# Patient Record
Sex: Female | Born: 1990 | Race: White | Hispanic: No | Marital: Single | Smoking: Former smoker
Health system: Southern US, Community
[De-identification: ages and names within clinical notes are randomized; demographics above are authoritative.]

## PROBLEM LIST (undated history)

## (undated) ENCOUNTER — Inpatient Hospital Stay (HOSPITAL_COMMUNITY): Payer: Self-pay

## (undated) DIAGNOSIS — F32A Depression, unspecified: Secondary | ICD-10-CM

## (undated) DIAGNOSIS — G40909 Epilepsy, unspecified, not intractable, without status epilepticus: Secondary | ICD-10-CM

## (undated) DIAGNOSIS — O139 Gestational [pregnancy-induced] hypertension without significant proteinuria, unspecified trimester: Secondary | ICD-10-CM

## (undated) HISTORY — PX: CHOLECYSTECTOMY: SHX55

## (undated) HISTORY — DX: Gestational (pregnancy-induced) hypertension without significant proteinuria, unspecified trimester: O13.9

## (undated) HISTORY — PX: ECTOPIC PREGNANCY SURGERY: SHX613

---

## 2021-10-27 ENCOUNTER — Encounter (HOSPITAL_COMMUNITY): Payer: Self-pay

## 2021-10-27 ENCOUNTER — Other Ambulatory Visit: Payer: Self-pay

## 2021-10-27 ENCOUNTER — Emergency Department (HOSPITAL_COMMUNITY)
Admission: EM | Admit: 2021-10-27 | Discharge: 2021-10-27 | Disposition: A | Discharge status: Home / Self Care | Payer: Medicaid Other | Attending: Emergency Medicine | Admitting: Emergency Medicine

## 2021-10-27 DIAGNOSIS — J029 Acute pharyngitis, unspecified: Secondary | ICD-10-CM | POA: Insufficient documentation

## 2021-10-27 DIAGNOSIS — Z20822 Contact with and (suspected) exposure to covid-19: Secondary | ICD-10-CM | POA: Diagnosis not present

## 2021-10-27 HISTORY — DX: Epilepsy, unspecified, not intractable, without status epilepticus: G40.909

## 2021-10-27 HISTORY — DX: Depression, unspecified: F32.A

## 2021-10-27 LAB — RESP PANEL BY RT-PCR (FLU A&B, COVID) ARPGX2
Influenza A by PCR: NEGATIVE
Influenza B by PCR: NEGATIVE
SARS Coronavirus 2 by RT PCR: NEGATIVE

## 2021-10-27 MED ORDER — BENZONATATE 100 MG PO CAPS: 100.0000 mg | ORAL_CAPSULE | Freq: Three times a day (TID) | ORAL | 0 refills | Status: DC

## 2021-10-27 NOTE — ED Triage Notes (Signed)
Patient reports cough and sore throat for the past few days.  Reports just moved here and doesn't have  PCP ?

## 2021-10-27 NOTE — ED Provider Notes (Signed)
?[facility] EM[facility] DEPARTMENT ?Provider Note ? ? ?CSN: [id number] ?Arrival date & time: 10/27/21  1136 ? ?  ? ?History ? ?Chief Complaint  ?Patient presents with  ? Sore Throat  ? ? ?Julie Case is a 31 y.o. female. ? ? ?Sore Throat ? ? ?Patient is a 31 year old female with no past pertinent medical history presenting today due to sore throat x2 days.  She is also having a nonproductive cough.  Denies any shortness of breath or chest pain, she has tried Vicks VapoRub without any relief.  No provoking features.  Not vaccinate against COVID and flu.  Does endorse losing sense of taste and smell. ? ?Home Medications ?Prior to Admission medications   ?Not on File  ?   ? ?Allergies    ?Naproxen and Penicillins   ? ?Review of Systems   ?Review of Systems ? ?Physical Exam ?Updated Vital Signs ?BP 125/84 (BP Location: Right Arm)   Pulse 90   Temp 98 ?F (36.7 ?C) (Oral)   Resp 16   Ht 5\' 5"  (1.651 m)   Wt 81.6 kg   LMP 10/07/2021   SpO2 97%   BMI 29.95 kg/m?  ?Physical Exam ?Vitals and nursing note reviewed. Exam conducted with a chaperone present.  ?Constitutional:   ?   Appearance: Normal appearance.  ?HENT:  ?   Head: Normocephalic.  ?   Right Ear: Tympanic membrane normal.  ?   Left Ear: Tympanic membrane normal.  ?   Nose: No congestion.  ?   Mouth/Throat:  ?   Pharynx: No posterior oropharyngeal erythema.  ?Eyes:  ?   Extraocular Movements: Extraocular movements intact.  ?   Pupils: Pupils are equal, round, and reactive to light.  ?Cardiovascular:  ?   Rate and Rhythm: Normal rate and regular rhythm.  ?   Comments: Lungs are clear to auscultation bilaterally ?Pulmonary:  ?   Effort: Pulmonary effort is normal.  ?   Breath sounds: Normal breath sounds.  ?   Comments: Lungs CTA bilaterally. No accessory muscle use. Speaking in complete sentences.  ?Abdominal:  ?   General: Abdomen is flat.  ?   Palpations: Abdomen is soft.  ?Musculoskeletal:  ?   Cervical back: Normal range of motion.  ?Neurological:  ?   Mental  Status: She is alert.  ?Psychiatric:     ?   Mood and Affect: Mood normal.  ? ? ?ED Results / Procedures / Treatments   ?Labs ?(all labs ordered are listed, but only abnormal results are displayed) ?Labs Reviewed  ?RESP PANEL BY RT-PCR (FLU A&B, COVID) [id number]  ? ? ?EKG ?None ? ?Radiology ?No results found. ? ?Procedures ?Procedures  ? ? ?Medications Ordered in ED ?Medications - No data to display ? ?ED Course/ Medical Decision Making/ A&P ?  ?                        ?Medical Decision Making ? ?This is a 31 year old female presenting today with viral symptoms.  Differential diagnosis includes but is not limited to virus, GERD, allergies. ? ?No independent historian, no contributing comorbidities.  Patient is new to the area and she does not have a primary care consistently which a social determinant of health.  Will provide primary care information for follow up. ? ?Patient vitals are stable, she is not febrile, hypoxic or tachycardic.  Lungs are clear to auscultation on exam, her physical exam is unremarkable.  Her symptoms appear most consistent  with a viral URI.  COVID flu swab pending.  I do not think any additional work-up is needed at this time given her stability, history and lack of risk factors.  Will discharge with Tessalon Perles for cough.  Encourage patient to follow-up with the primary, information provided.   ? ? ? ? ? ? ? ?Final Clinical Impression(s) / ED Diagnoses ?Final diagnoses:  ?None  ? ? ?Rx / DC Orders ?ED Discharge Orders   ? ? None  ? ?  ? ? ?  ?Theron Arista, PA-C ?10/27/21 1302 ? ?  ?Bethann Berkshire, MD ?10/28/21 1005 ? ?

## 2021-10-27 NOTE — Discharge Instructions (Addendum)
Information provided for primary care follow-up.  Please check on MyChart for the result of your COVID flu test.  Work note provided.  Return to ED if things change or worsen significantly. ?

## 2021-11-25 ENCOUNTER — Emergency Department (HOSPITAL_COMMUNITY)
Admission: EM | Admit: 2021-11-25 | Discharge: 2021-11-25 | Disposition: A | Discharge status: Home / Self Care | Payer: Medicaid Other | Attending: Emergency Medicine | Admitting: Emergency Medicine

## 2021-11-25 ENCOUNTER — Encounter (HOSPITAL_COMMUNITY): Payer: Self-pay

## 2021-11-25 ENCOUNTER — Other Ambulatory Visit: Payer: Self-pay

## 2021-11-25 ENCOUNTER — Emergency Department (HOSPITAL_COMMUNITY): Payer: Medicaid Other

## 2021-11-25 DIAGNOSIS — W1830XA Fall on same level, unspecified, initial encounter: Secondary | ICD-10-CM | POA: Insufficient documentation

## 2021-11-25 DIAGNOSIS — M25561 Pain in right knee: Secondary | ICD-10-CM | POA: Diagnosis not present

## 2021-11-25 MED ORDER — KETOROLAC TROMETHAMINE 60 MG/2ML IM SOLN
60.0000 mg | Freq: Once | INTRAMUSCULAR | Status: AC
  Administered 2021-11-25: 60 mg via INTRAMUSCULAR
  Filled 2021-11-25: qty 2

## 2021-11-25 NOTE — ED Triage Notes (Signed)
Patient states that her right knee cap started hurting after a fall last week.  Patient states that she felt something pop when she fell.  ?

## 2021-11-25 NOTE — ED Provider Notes (Signed)
?[facility] EMERGENCY DEPARTMENT ?Provider Note ? ? ?CSN: [id number] ?Arrival date & time: 11/25/21  1432 ? ?  ? ?History ? ?Chief Complaint  ?Patient presents with  ? Knee Pain  ? ? ?Julie Bojarski is a 31 y.o. female. ? ? ?Knee Pain ? ?Patient presents with right knee pain x3 days.  Happened acutely after a fall on flat ground.  Denies any her head, no hip pain or ankle pain.  Pain is worse with bearing weight, also worse when walking up and down the stairs.  Denies any paresthesias, has not noticed any swelling or erythema.  No previous surgeries to the knee.  Has tried Tylenol with minimal relief. ? ?Home Medications ?Prior to Admission medications   ?Medication Sig Start Date End Date Taking? Authorizing Provider  ?acetaminophen (TYLENOL) 500 MG tablet Take 500 mg by mouth every 6 (six) hours as needed for mild pain.   Yes [provider]  ?famotidine (PEPCID) 20 MG tablet Take 20 mg by mouth daily as needed for heartburn or indigestion.   Yes [provider]  ?   ? ?Allergies    ?Penicillins and Naproxen   ? ?Review of Systems   ?Review of Systems ? ?Physical Exam ?Updated Vital Signs ?BP 103/79   Pulse 74   Temp (!) 97.5 ?F (36.4 ?C) (Oral)   Resp 18   Ht 5\' 5"  (1.651 m)   Wt 86.2 kg   SpO2 99%   BMI 31.62 kg/m?  ?Physical Exam ?Vitals and nursing note reviewed. Exam conducted with a chaperone present.  ?Constitutional:   ?   General: She is not in acute distress. ?   Appearance: Normal appearance.  ?HENT:  ?   Head: Normocephalic and atraumatic.  ?Eyes:  ?   General: No scleral icterus. ?   Extraocular Movements: Extraocular movements intact.  ?   Pupils: Pupils are equal, round, and reactive to light.  ?Cardiovascular:  ?   Pulses: Normal pulses.  ?Musculoskeletal:     ?   General: Tenderness present. No swelling.  ?   Comments: No tenderness and full ROM to right hip and ankle.  Diffuse tenderness with any manipulation of the patella.  Patient is able to tolerate passive ROM, most  painful with extension of the right knee.  ?Skin: ?   Capillary Refill: Capillary refill takes less than 2 seconds.  ?   Coloration: Skin is not jaundiced.  ?Neurological:  ?   Mental Status: She is alert. Mental status is at baseline.  ?   Coordination: Coordination normal.  ? ? ?ED Results / Procedures / Treatments   ?Labs ?(all labs ordered are listed, but only abnormal results are displayed) ?Labs Reviewed - No data to display ? ?EKG ?None ? ?Radiology ?DG Knee Complete 4 Views Right ? ?Result Date: 11/25/2021 ?CLINICAL DATA:  Status post fall with knee pain. EXAM: RIGHT KNEE - COMPLETE 4+ VIEW COMPARISON:  None. FINDINGS: No evidence of fracture, dislocation, or joint effusion. No evidence of arthropathy or other focal bone abnormality. Soft tissues are unremarkable. IMPRESSION: Negative. Electronically Signed   By: 11/27/2021 M.D.   On: 11/25/2021 15:31   ? ?Procedures ?Procedures  ? ? ?Medications Ordered in ED ?Medications  ?ketorolac (TORADOL) injection 60 mg (60 mg Intramuscular Given 11/25/21 1608)  ? ? ?ED Course/ Medical Decision Making/ A&P ?  ?                        ?  Medical Decision Making ?Amount and/or Complexity of Data Reviewed ?Radiology: ordered. ? ?Risk ?Prescription drug management. ? ? ?Patient presents with right knee pain.  She is neurovascular intact with radial pulse and PT 2+, Brisk cap refill.  Tolerates passive and active range to ankle and hip without difficulty.  Decreased ROM to right knee secondary to pain, diffuse tenderness not focal or associate with any crepitus.  I ordered, viewed interpreted x-ray which did not show any acute bony process.  There is no obvious effusion or swelling over the knee, doubt septic joint.  Patient ordered shot of Toradol and given knee immobilizer, will have her follow-up with orthopedic if needed.  ? ? ? ? ? ? ? ?Final Clinical Impression(s) / ED Diagnoses ?Final diagnoses:  ?Acute pain of right knee  ? ? ?Rx / DC Orders ?ED Discharge Orders    ? ? None  ? ?  ? ? ?  ?Theron Arista, PA-C ?11/25/21 1738 ? ?  ?Glendora Score, MD ?11/26/21 0005 ? ?

## 2021-11-25 NOTE — Discharge Instructions (Addendum)
Take 800 mg of ibuprofen every 8 hours for inflammation.  Take with food and water to prevent stomach irritation.  Ice the knee, wear the knee immobilizer with ambulation.  Call the orthopedic office Monday to schedule outpatient follow-up. ?

## 2022-01-06 ENCOUNTER — Encounter (HOSPITAL_COMMUNITY): Payer: Self-pay | Admitting: Emergency Medicine

## 2022-01-06 ENCOUNTER — Emergency Department (HOSPITAL_COMMUNITY)
Admission: EM | Admit: 2022-01-06 | Discharge: 2022-01-06 | Disposition: A | Discharge status: Home / Self Care | Payer: Medicaid Other | Attending: Emergency Medicine | Admitting: Emergency Medicine

## 2022-01-06 ENCOUNTER — Other Ambulatory Visit: Payer: Self-pay

## 2022-01-06 ENCOUNTER — Emergency Department (HOSPITAL_COMMUNITY): Payer: Medicaid Other

## 2022-01-06 DIAGNOSIS — O26891 Other specified pregnancy related conditions, first trimester: Secondary | ICD-10-CM | POA: Insufficient documentation

## 2022-01-06 DIAGNOSIS — R197 Diarrhea, unspecified: Secondary | ICD-10-CM | POA: Diagnosis not present

## 2022-01-06 DIAGNOSIS — Z3A01 Less than 8 weeks gestation of pregnancy: Secondary | ICD-10-CM | POA: Diagnosis not present

## 2022-01-06 DIAGNOSIS — R110 Nausea: Secondary | ICD-10-CM | POA: Insufficient documentation

## 2022-01-06 DIAGNOSIS — Z3201 Encounter for pregnancy test, result positive: Secondary | ICD-10-CM

## 2022-01-06 DIAGNOSIS — O219 Vomiting of pregnancy, unspecified: Secondary | ICD-10-CM | POA: Diagnosis not present

## 2022-01-06 DIAGNOSIS — F17290 Nicotine dependence, other tobacco product, uncomplicated: Secondary | ICD-10-CM | POA: Insufficient documentation

## 2022-01-06 LAB — COMPREHENSIVE METABOLIC PANEL
ALT: 29 U/L (ref 0–44)
AST: 18 U/L (ref 15–41)
Albumin: 4 g/dL (ref 3.5–5.0)
Alkaline Phosphatase: 81 U/L (ref 38–126)
Anion gap: 5 (ref 5–15)
BUN: 10 mg/dL (ref 6–20)
CO2: 26 mmol/L (ref 22–32)
Calcium: 9.3 mg/dL (ref 8.9–10.3)
Chloride: 106 mmol/L (ref 98–111)
Creatinine, Ser: 0.57 mg/dL (ref 0.44–1.00)
GFR, Estimated: >60 mL/min (ref >60)
Glucose, Bld: 95 mg/dL (ref 70–99)
Potassium: 3.8 mmol/L (ref 3.5–5.1)
Sodium: 137 mmol/L (ref 135–145)
Total Bilirubin: 0.6 mg/dL (ref 0.3–1.2)
Total Protein: 7.3 g/dL (ref 6.5–8.1)

## 2022-01-06 LAB — CBC WITH DIFFERENTIAL/PLATELET
Abs Immature Granulocytes: 0.03 10*3/uL (ref 0.00–0.07)
Basophils Absolute: 0 10*3/uL (ref 0.0–0.1)
Basophils Relative: 1 %
Eosinophils Absolute: 0.4 10*3/uL (ref 0.0–0.5)
Eosinophils Relative: 6 %
HCT: 42.4 % (ref 36.0–46.0)
Hemoglobin: 14.6 g/dL (ref 12.0–15.0)
Immature Granulocytes: 0 %
Lymphocytes Relative: 24 %
Lymphs Abs: 1.7 10*3/uL (ref 0.7–4.0)
MCH: 29.6 pg (ref 26.0–34.0)
MCHC: 34.4 g/dL (ref 30.0–36.0)
MCV: 85.8 fL (ref 80.0–100.0)
Monocytes Absolute: 0.6 10*3/uL (ref 0.1–1.0)
Monocytes Relative: 8 %
Neutro Abs: 4.2 10*3/uL (ref 1.7–7.7)
Neutrophils Relative %: 61 %
Platelets: 239 10*3/uL (ref 150–400)
RBC: 4.94 MIL/uL (ref 3.87–5.11)
RDW: 12.3 % (ref 11.5–15.5)
WBC: 6.9 10*3/uL (ref 4.0–10.5)
nRBC: 0 % (ref 0.0–0.2)

## 2022-01-06 LAB — URINALYSIS, ROUTINE W REFLEX MICROSCOPIC
Bilirubin Urine: NEGATIVE
Glucose, UA: NEGATIVE mg/dL
Hgb urine dipstick: NEGATIVE
Ketones, ur: NEGATIVE mg/dL
Leukocytes,Ua: NEGATIVE
Nitrite: NEGATIVE
Protein, ur: NEGATIVE mg/dL
Specific Gravity, Urine: 1.002 — ABNORMAL LOW (ref 1.005–1.030)
pH: 7 (ref 5.0–8.0)

## 2022-01-06 LAB — LIPASE, BLOOD: Lipase: 25 U/L (ref 11–51)

## 2022-01-06 LAB — HCG, QUANTITATIVE, PREGNANCY: hCG, Beta Chain, Quant, S: 2120 m[IU]/mL — ABNORMAL HIGH (ref <5)

## 2022-01-06 LAB — POC URINE PREG, ED: Preg Test, Ur: POSITIVE — AB

## 2022-01-06 MED ORDER — ONDANSETRON HCL 4 MG/2ML IJ SOLN
4.0000 mg | Freq: Once | INTRAMUSCULAR | Status: AC
  Administered 2022-01-06: 4 mg via INTRAVENOUS
  Filled 2022-01-06: qty 2

## 2022-01-06 MED ORDER — ONDANSETRON 4 MG PO TBDP: 4.0000 mg | ORAL_TABLET | Freq: Three times a day (TID) | ORAL | 0 refills | Status: DC | PRN

## 2022-01-06 MED ORDER — SODIUM CHLORIDE 0.9 % IV BOLUS
1000.0000 mL | Freq: Once | INTRAVENOUS | Status: AC
  Administered 2022-01-06: 1000 mL via INTRAVENOUS

## 2022-01-06 NOTE — Discharge Instructions (Addendum)
You have been provided the contact information for a local OB/GYN office by the name of [facility].  Follow-up with OB/GYN within the next 2 to 3 days to establish care and for continued medical management.  A prescription by the name of Zofran has been sent to your pharmacy.  These are disintegrating tablets you place under your tongue that may provide nausea relief.  You may take 1 every 8 hours as needed for symptom relief.  Further information for first trimester pregnancy has been provided for you.  Please review at your leisure.  Return to the ED for new or worsening symptoms as discussed.

## 2022-01-06 NOTE — ED Provider Notes (Signed)
[facility] EMERGENCY DEPARTMENT Provider Note   CSN: [id number] Arrival date & time: 01/06/22  1346     History  Chief Complaint  Patient presents with   Nausea    Julie Case is a 31 y.o. female with chief complaint of nausea and diarrhea.  Nausea started 4 days ago, with 1 day of diarrhea.  Denies vomiting, abdominal pain, fevers, urinary symptoms, constipation, chest pain, shortness of breath.  Has not been anybody with similar symptoms.  Denies vaginal bleeding or discharge, or bloody stools.  No recent upper respiratory infection or antibiotic use.  LMP 5 weeks ago, patient states she is 1 week late and that this is irregular for her as she is normally very consistent with her schedule.  Hx of ectopic pregnancy last year, that required surgical intervention at [redacted] weeks gestation.  Patient unsure of how many total pregnancies, but does have 3 living children.  The history is provided by the patient and medical records.       Home Medications Prior to Admission medications   Medication Sig Start Date End Date Taking? Authorizing Provider  ondansetron (ZOFRAN-ODT) 4 MG disintegrating tablet Take 1 tablet (4 mg total) by mouth every 8 (eight) hours as needed for nausea or vomiting. 01/06/22  Yes Cecil Cobbs, PA-C  acetaminophen (TYLENOL) 500 MG tablet Take 500 mg by mouth every 6 (six) hours as needed for mild pain.    [provider]  famotidine (PEPCID) 20 MG tablet Take 20 mg by mouth daily as needed for heartburn or indigestion.    [provider]      Allergies    Penicillins and Naproxen    Review of Systems   Review of Systems  Gastrointestinal:  Positive for diarrhea and nausea.    Physical Exam Updated Vital Signs BP 101/64   Pulse 66   Temp 97.9 F (36.6 C) (Oral)   Resp 17   Ht  (1.651 m)   Wt 88.5 kg   LMP 11/30/2021   SpO2 100%   BMI 32.45 kg/m  Physical Exam Vitals and nursing note reviewed.  Constitutional:       General: She is not in acute distress.    Appearance: She is well-developed.  HENT:     Head: Normocephalic and atraumatic.  Eyes:     Conjunctiva/sclera: Conjunctivae normal.  Cardiovascular:     Rate and Rhythm: Normal rate and regular rhythm.     Pulses: Normal pulses.     Heart sounds: Normal heart sounds. No murmur heard. Pulmonary:     Effort: Pulmonary effort is normal. No respiratory distress.     Breath sounds: Normal breath sounds.  Chest:     Chest wall: No tenderness.  Abdominal:     General: Bowel sounds are normal. There is no distension.     Palpations: Abdomen is soft. There is no mass.     Tenderness: There is no abdominal tenderness. There is no rebound.  Musculoskeletal:        General: No swelling.     Cervical back: Neck supple.  Skin:    General: Skin is warm and dry.     Capillary Refill: Capillary refill takes less than 2 seconds.  Neurological:     Mental Status: She is alert and oriented to person, place, and time.  Psychiatric:        Mood and Affect: Mood normal.     ED Results / Procedures / Treatments   Labs (all  labs ordered are listed, but only abnormal results are displayed) Labs Reviewed  URINALYSIS, ROUTINE W REFLEX MICROSCOPIC - Abnormal; Notable for the following components:      Result Value   Color, Urine STRAW (*)    APPearance HAZY (*)    Specific Gravity, Urine 1.002 (*)    All other components within normal limits  HCG, QUANTITATIVE, PREGNANCY - Abnormal; Notable for the following components:   hCG, Beta Chain, Quant, S 2,120 (*)    All other components within normal limits  POC URINE PREG, ED - Abnormal; Notable for the following components:   Preg Test, Ur POSITIVE (*)    All other components within normal limits  CBC WITH DIFFERENTIAL/PLATELET  COMPREHENSIVE METABOLIC PANEL  LIPASE, BLOOD    EKG None  Radiology US OB LESS THAN 14 WEEKS WITH OB TRANSVAGINAL  Result Date: 01/06/2022 CLINICAL DATA:  Nausea, positive  urine pregnancy test, history of right tubal ectopic pregnancy status post salpingectomy EXAM: OBSTETRIC <14 WK ULTRASOUND TECHNIQUE: Transabdominal ultrasound was performed for evaluation of the gestation as well as the maternal uterus and adnexal regions. COMPARISON:  None Available. FINDINGS: Intrauterine gestational sac: Single Yolk sac:  Visualized. Embryo:  Not Visualized. Cardiac Activity: Not Visualized. Heart Rate: Not visualized. MSD:  17.5 mm   6 w   5 d Subchorionic hemorrhage:  None visualized. Maternal uterus/adnexae: Trace pleural fluid in the low pelvis. IMPRESSION: 1. Single candidate intrauterine gestational sac in the endometrial cavity. Yolk sac is visualized without fetal pole at this time. Early pregnancy of uncertain viability. Recommend follow-up quantitative B-HCG levels and follow-up US in 7-14 days to assess continued development and viability. 2.  Trace nonspecific free fluid in the low pelvis. Electronically Signed   By: Jearld LeschAlex D Bibbey M.D.   On: 01/06/2022 17:23    Procedures Procedures    Medications Ordered in ED Medications  sodium chloride 0.9 % bolus 1,000 mL (0 mLs Intravenous Stopped 01/06/22 1636)  ondansetron (ZOFRAN) injection 4 mg (4 mg Intravenous Given 01/06/22 1503)    ED Course/ Medical Decision Making/ A&P Clinical Course as of 01/06/22 1838  Sat Jan 06, 2022  1833 HCG, Clement SayresBeta Chain, Quant, Kathie RhodesS(!): 2,120 [AC]    Clinical Course User Index [AC] Cecil Cobbsockerham, Kohan Azizi M, PA-C                           Medical Decision Making Amount and/or Complexity of Data Reviewed External Data Reviewed: notes. Labs: ordered. Decision-making details documented in ED Course. Radiology: ordered and independent interpretation performed. Decision-making details documented in ED Course. ECG/medicine tests: ordered and independent interpretation performed. Decision-making details documented in ED Course.  Risk OTC drugs. Prescription drug management.   31 y.o. female  presents to the ED for concern of Nausea   This involves an extensive number of treatment options, and is a complaint that carries with it a high risk of complications and morbidity.  The emergent differential diagnosis prior to evaluation includes, but is not limited to: appendicitis, bowel obstruction, bowel perforation, cholecystitis, diverticulitis, renal calculi, pancreatitis, gastroenteritis, UTI, PID, or ectopic pregnancy.  This is not an exhaustive differential.   Past Medical History / Co-morbidities / Social History: Hx of epilepsy, depression, prior cholecystectomy, prior ectopic pregnancy with surgical intervention, chronic tobacco use Social Determinants of Health include daily tobacco use, for cessation counseling was provided.  Additional History:  Internal and external records from outside source obtained and reviewed including ED visits  Physical Exam: Physical exam performed. The pertinent findings include: Abdomen soft nontender.  Lab Tests: I ordered, and personally interpreted labs.  The pertinent results include:   CBC: Unremarkable CMP/BMP: Unremarkable Lipase: 25, negative UA: Unremarkable Urine pregnancy: Positive hCG quantitative: 2,120  Imaging Studies: I ordered imaging studies including pelvic/transvaginal ultrasound.  I independently visualized and interpreted said imaging.  Pertinent results include: 1. Single candidate intrauterine gestational sac in the endometrial cavity. Yolk sac is visualized without fetal pole at this time. Early pregnancy of uncertain viability. Recommend follow-up quantitative B-HCG levels and follow-up US in 7-14 days to assess continued development and viability 2.  Trace nonspecific free fluid in the low pelvis. I agree with the radiologist interpretation.  Medications: I have reviewed the patients home medicines and have made adjustments as needed  ED Course/Disposition: Pt well-appearing on exam.  Patient presenting today  with nausea and diarrhea over the last 4 days.  Abdomen soft nontender.  Afebrile, without vomiting, in NAD.  No vaginal bleeding or discharge.  Positive urine pregnancy.  Hx of ectopic pregnancy about 6 months ago, for which a laparoscopy was performed and the ectopic pregnancy removed, leaving the ovary intact.  Unknown gestation.  hCG quantitative pending.  With this history, patient is at increased risk for another ectopic pregnancy.  Not concerned for active rupture at this time.  Hx of cholecystectomy.  No clinical indication of appendicitis, bowel obstruction, bowel perforation, cholecystitis, diverticulitis, renal calculi, pancreatitis, UTI, or PID.  IVF provided for possible fluid loss from diarrhea and Zofran for nausea relief.  Reevaluation shows that this is provided moderate improvement of the patient's symptoms.  Patient is new to the area from [place], and has not established with PCP or OB/GYN.  Ordered pelvic/transvaginal ultrasound to assess for possible ectopic pregnancy.  1630: [place] order still pending.    1500: hCG quantitative at 2,120.  This seems to correlate with hx of LMP.  US performed, results pending.  1730: A single intrauterine gestational sac appreciated on [place], without evidence of current ectopic pregnancy.  Unknown viability at this time.  Not concerned for ectopic pregnancy or threatened pregnancy at this time.  Overall remaining work-up unremarkable.  Recommend conservative nausea management and establishing with local OB/GYN for close follow-up.  Recommend tracking BhCG and repeat US.  Resources provided.  Patient in NAD and in good condition at time of discharge.  After consideration of the diagnostic results and the patient's encounter today, I feel that the emergency department workup does not suggest an emergent condition requiring admission or immediate intervention beyond what has been performed at this time.  The patient is safe for discharge and has been instructed  to return immediately for worsening symptoms, change in symptoms or any other concerns.  Discussed course of treatment thoroughly with the patient, whom demonstrated understanding.  Patient in agreement and has no further questions.  I discussed this case with my attending physician Dr. Rubin Payor, who agreed with the proposed treatment course and cosigned this note including patient's presenting symptoms, physical exam, and planned diagnostics and interventions.  Attending physician stated agreement with plan or made changes to plan which were implemented.     This chart was dictated using voice recognition software.  Despite best efforts to proofread, errors can occur which can change the documentation meaning.         Final Clinical Impression(s) / ED Diagnoses Final diagnoses:  Nausea  Diarrhea, unspecified type  Positive pregnancy test    Rx /  DC Orders ED Discharge Orders          Ordered    ondansetron (ZOFRAN-ODT) 4 MG disintegrating tablet  Every 8 hours PRN        01/06/22 1514              Cecil Cobbs, Cordelia Poche 01/06/22 1842    Benjiman Core, MD 01/07/22 0000

## 2022-01-06 NOTE — ED Triage Notes (Signed)
Patient c/o nausea and diarrhea x4 days. Denies any pain, vomiting, fevers, or urinary symptoms. Denies being around anyone with similar symptoms. Patient unsure if she is pregnant.

## 2022-01-09 ENCOUNTER — Ambulatory Visit (INDEPENDENT_AMBULATORY_CARE_PROVIDER_SITE_OTHER): Payer: Medicaid Other | Admitting: Family Medicine

## 2022-01-09 ENCOUNTER — Encounter: Payer: Self-pay | Admitting: Family Medicine

## 2022-01-09 VITALS — BP 120/80 | HR 103 | Ht 65.0 in | Wt 205.6 lb

## 2022-01-09 DIAGNOSIS — R197 Diarrhea, unspecified: Secondary | ICD-10-CM | POA: Diagnosis not present

## 2022-01-09 DIAGNOSIS — Z3A01 Less than 8 weeks gestation of pregnancy: Secondary | ICD-10-CM | POA: Diagnosis not present

## 2022-01-09 NOTE — Progress Notes (Unsigned)
New Patient Office Visit  Subjective:  Patient ID: Julie Case, female    DOB: September 26, 1990  Age: 31 y.o. MRN: [id number]  CC:  Chief Complaint  Patient presents with   New Patient (Initial Visit)    Pt establishing care, pt would like to discuss ED visit, complains of having diarrhea for the past 4 days.     HPI Julie Case is a 31 y.o. Female who presents today with c/o of diarrhea for 4 days. She reports having 3-4 loose stools daily. No blood or mucus was noted. No changes in diet. No fever, chills, or abdominal pain. She recently had a positive pregnancy test in the ED.  Past Medical History:  Diagnosis Date   Depression    Epilepsy (HCC)     Past Surgical History:  Procedure Laterality Date   CHOLECYSTECTOMY     ECTOPIC PREGNANCY SURGERY     with right tube removal    Family History  Problem Relation Age of Onset   Hypertension Other    Diabetes Other     Social History   Socioeconomic History   Marital status: Single    Spouse name: Not on file   Number of children: Not on file   Years of education: Not on file   Highest education level: Not on file  Occupational History   Not on file  Tobacco Use   Smoking status: Every Day    Types: Cigarettes   Smokeless tobacco: Never  Vaping Use   Vaping Use: Never used  Substance and Sexual Activity   Alcohol use: Never   Drug use: Never   Sexual activity: Yes  Other Topics Concern   Not on file  Social History Narrative   Not on file   Social Determinants of Health   Financial Resource Strain: Not on file  Food Insecurity: Not on file  Transportation Needs: Not on file  Physical Activity: Not on file  Stress: Not on file  Social Connections: Not on file  Intimate Partner Violence: Not on file    ROS Review of Systems  Constitutional:  Negative for diaphoresis and unexpected weight change.  HENT:  Negative for tinnitus, trouble swallowing and voice change.   Eyes:  Negative for photophobia and  redness.  Respiratory:  Negative for wheezing and stridor.   Cardiovascular:  Negative for leg swelling.  Gastrointestinal:  Positive for diarrhea. Negative for constipation and nausea.  Endocrine: Negative for polydipsia, polyphagia and polyuria.  Genitourinary:  Negative for frequency, urgency, vaginal bleeding, vaginal discharge and vaginal pain.  Musculoskeletal:  Negative for back pain and myalgias.  Skin:  Negative for rash and wound.  Neurological:  Negative for dizziness and headaches.  Psychiatric/Behavioral:  Negative for confusion, self-injury and suicidal ideas.     Objective:   Today's Vitals: BP 120/80   Pulse (!) 103   Ht 5\' 5"  (1.651 m)   Wt 205 lb 9.6 oz (93.3 kg)   LMP  (LMP Unknown)   SpO2 98%   BMI 34.21 kg/m   Physical Exam HENT:     Head: Normocephalic.     Right Ear: External ear normal.     Left Ear: External ear normal.     Nose: No congestion or rhinorrhea.     Mouth/Throat:     Mouth: Mucous membranes are moist.  Eyes:     Extraocular Movements: Extraocular movements intact.     Pupils: Pupils are equal, round, and reactive to light.  Cardiovascular:  Rate and Rhythm: Normal rate and regular rhythm.     Pulses: Normal pulses.     Heart sounds: Normal heart sounds.  Pulmonary:     Effort: Pulmonary effort is normal.     Breath sounds: Normal breath sounds.  Abdominal:     Palpations: Abdomen is soft.  Musculoskeletal:     Cervical back: No rigidity.     Right lower leg: No edema.     Left lower leg: No edema.  Lymphadenopathy:     Cervical: No cervical adenopathy.  Skin:    Findings: No lesion or rash.  Neurological:     Mental Status: She is alert and oriented to person, place, and time.  Psychiatric:     Comments: Normal affect     Assessment & Plan:   Problem List Items Addressed This Visit       Other   Diarrhea - Primary    -advise to avoid solid food or dairy or to restrict diet to bananas, rice, applesauce, and toast  (BRAT diet) -advise to increase your intake of water, juice, broth based soups, or sports drinks.BRAT      Pregnancy    -positive pregnancy test on 01/06/22 -referral made to gyn      Relevant Orders   Ambulatory referral to Gynecology   Other Visit Diagnoses     Acute diarrhea           Outpatient Encounter Medications as of 01/09/2022  Medication Sig   acetaminophen (TYLENOL) 500 MG tablet Take 500 mg by mouth every 6 (six) hours as needed for mild pain. (Patient not taking: Reported on 01/09/2022)   famotidine (PEPCID) 20 MG tablet Take 20 mg by mouth daily as needed for heartburn or indigestion. (Patient not taking: Reported on 01/09/2022)   ondansetron (ZOFRAN-ODT) 4 MG disintegrating tablet Take 1 tablet (4 mg total) by mouth every 8 (eight) hours as needed for nausea or vomiting. (Patient not taking: Reported on 01/09/2022)   No facility-administered encounter medications on file as of 01/09/2022.    Follow-up: Return in about 3 months (around 04/11/2022).   Gilmore Laroche, FNP

## 2022-01-09 NOTE — Patient Instructions (Addendum)
I appreciate the opportunity to provide care to you today!    Follow up:  3 months  Labs: next visit  Diarrhea -avoid solid food or dairy or to restrict diet to bananas, rice, applesauce, and toast (BRAT diet) -Increase your intake of water, juice, broth based soups, or sports drinks.BRAT    Please continue to a heart-healthy diet and increase your physical activities. Try to exercise for at least three times a week.      It was a pleasure to see you and I look forward to continuing to work together on your health and well-being. Please do not hesitate to call the office if you need care or have questions about your care.   Have a wonderful day and week. With Gratitude, Gilmore Laroche MSN, FNP-BC

## 2022-01-09 NOTE — Progress Notes (Unsigned)
   New Patient Office Visit  Subjective:  Patient ID: Julie Case, female    DOB: [birth year]  Age: 31 y.o. MRN: [id number]  CC:  Chief Complaint  Patient presents with   New Patient (Initial Visit)    Pt establishing care, pt would like to discuss ED visit, complains of having diarrhea for the past 4 days.     HPI Julie Case is a 31 y.o. female with past medical history of *** presents for establishing care. Diarrhea: 4-5 loose stools in [employer] 3-4 days -nothning - eating soup and not firedn  No mussica or blood.   Past Medical History:  Diagnosis Date   Depression    Epilepsy (HCC)     Past Surgical History:  Procedure Laterality Date   CHOLECYSTECTOMY     ECTOPIC PREGNANCY SURGERY     with right tube removal    Family History  Problem Relation Age of Onset   Hypertension Other    Diabetes Other     Social History   Socioeconomic History   Marital status: Single    Spouse name: Not on file   Number of children: Not on file   Years of education: Not on file   Highest education level: Not on file  Occupational History   Not on file  Tobacco Use   Smoking status: Every Day    Types: Cigarettes   Smokeless tobacco: Never  Vaping Use   Vaping Use: Never used  Substance and Sexual Activity   Alcohol use: Never   Drug use: Never   Sexual activity: Yes  Other Topics Concern   Not on file  Social History Narrative   Not on file   Social Determinants of Health   Financial Resource Strain: Not on file  Food Insecurity: Not on file  Transportation Needs: Not on file  Physical Activity: Not on file  Stress: Not on file  Social Connections: Not on file  Intimate Partner Violence: Not on file    ROS Review of Systems  Gastrointestinal:  Positive for diarrhea.    Objective:   Today's Vitals: BP 120/80   Pulse (!) 103   Ht 5\' 5"  (1.651 m)   Wt 205 lb 9.6 oz (93.3 kg)   LMP 11/30/2021   SpO2 98%   BMI 34.21 kg/m   Physical Exam  Assessment &  Plan:   Problem List Items Addressed This Visit   None   Outpatient Encounter Medications as of 01/09/2022  Medication Sig   acetaminophen (TYLENOL) 500 MG tablet Take 500 mg by mouth every 6 (six) hours as needed for mild pain. (Patient not taking: Reported on 01/09/2022)   famotidine (PEPCID) 20 MG tablet Take 20 mg by mouth daily as needed for heartburn or indigestion. (Patient not taking: Reported on 01/09/2022)   ondansetron (ZOFRAN-ODT) 4 MG disintegrating tablet Take 1 tablet (4 mg total) by mouth every 8 (eight) hours as needed for nausea or vomiting. (Patient not taking: Reported on 01/09/2022)   No facility-administered encounter medications on file as of 01/09/2022.    Follow-up: No follow-ups on file.   01/11/2022, FNP

## 2022-01-10 ENCOUNTER — Encounter: Payer: Self-pay | Admitting: Family Medicine

## 2022-01-10 DIAGNOSIS — Z3490 Encounter for supervision of normal pregnancy, unspecified, unspecified trimester: Secondary | ICD-10-CM | POA: Insufficient documentation

## 2022-01-10 DIAGNOSIS — R197 Diarrhea, unspecified: Secondary | ICD-10-CM | POA: Insufficient documentation

## 2022-01-10 NOTE — Assessment & Plan Note (Signed)
-  advise to avoid solid food or dairy or to restrict diet to bananas, rice, applesauce, and toast (BRAT diet) -advise to increase your intake of water, juice, broth based soups, or sports drinks.BRAT

## 2022-01-10 NOTE — Assessment & Plan Note (Signed)
-  positive pregnancy test on 01/06/22 -referral made to gyn

## 2022-01-19 ENCOUNTER — Encounter (HOSPITAL_COMMUNITY): Payer: Self-pay

## 2022-01-19 ENCOUNTER — Emergency Department (HOSPITAL_COMMUNITY)
Admission: EM | Admit: 2022-01-19 | Discharge: 2022-01-19 | Disposition: A | Discharge status: Home / Self Care | Payer: Medicaid Other | Attending: Emergency Medicine | Admitting: Emergency Medicine

## 2022-01-19 ENCOUNTER — Emergency Department (HOSPITAL_COMMUNITY): Payer: Medicaid Other

## 2022-01-19 ENCOUNTER — Other Ambulatory Visit: Payer: Self-pay

## 2022-01-19 DIAGNOSIS — O26891 Other specified pregnancy related conditions, first trimester: Secondary | ICD-10-CM | POA: Diagnosis not present

## 2022-01-19 DIAGNOSIS — Z3A01 Less than 8 weeks gestation of pregnancy: Secondary | ICD-10-CM | POA: Insufficient documentation

## 2022-01-19 DIAGNOSIS — O4690 Antepartum hemorrhage, unspecified, unspecified trimester: Secondary | ICD-10-CM

## 2022-01-19 DIAGNOSIS — O209 Hemorrhage in early pregnancy, unspecified: Secondary | ICD-10-CM | POA: Diagnosis not present

## 2022-01-19 DIAGNOSIS — N939 Abnormal uterine and vaginal bleeding, unspecified: Secondary | ICD-10-CM | POA: Diagnosis not present

## 2022-01-19 LAB — URINALYSIS, ROUTINE W REFLEX MICROSCOPIC
Bilirubin Urine: NEGATIVE
Glucose, UA: NEGATIVE mg/dL
Ketones, ur: NEGATIVE mg/dL
Leukocytes,Ua: NEGATIVE
Nitrite: NEGATIVE
Protein, ur: NEGATIVE mg/dL
Specific Gravity, Urine: 1.011 (ref 1.005–1.030)
pH: 5 (ref 5.0–8.0)

## 2022-01-19 LAB — COMPREHENSIVE METABOLIC PANEL WITH GFR
ALT: 17 U/L (ref 0–44)
AST: 13 U/L — ABNORMAL LOW (ref 15–41)
Albumin: 4.1 g/dL (ref 3.5–5.0)
Alkaline Phosphatase: 80 U/L (ref 38–126)
Anion gap: 6 (ref 5–15)
BUN: 8 mg/dL (ref 6–20)
CO2: 27 mmol/L (ref 22–32)
Calcium: 9.4 mg/dL (ref 8.9–10.3)
Chloride: 106 mmol/L (ref 98–111)
Creatinine, Ser: 0.52 mg/dL (ref 0.44–1.00)
GFR, Estimated: >60 mL/min
Glucose, Bld: 95 mg/dL (ref 70–99)
Potassium: 3.9 mmol/L (ref 3.5–5.1)
Sodium: 139 mmol/L (ref 135–145)
Total Bilirubin: 0.5 mg/dL (ref 0.3–1.2)
Total Protein: 7.3 g/dL (ref 6.5–8.1)

## 2022-01-19 LAB — CBC WITH DIFFERENTIAL/PLATELET
Abs Immature Granulocytes: 0.04 10*3/uL (ref 0.00–0.07)
Basophils Absolute: 0.1 10*3/uL (ref 0.0–0.1)
Basophils Relative: 1 %
Eosinophils Absolute: 0.4 10*3/uL (ref 0.0–0.5)
Eosinophils Relative: 5 %
HCT: 41.5 % (ref 36.0–46.0)
Hemoglobin: 14.1 g/dL (ref 12.0–15.0)
Immature Granulocytes: 1 %
Lymphocytes Relative: 21 %
Lymphs Abs: 1.8 10*3/uL (ref 0.7–4.0)
MCH: 29.1 pg (ref 26.0–34.0)
MCHC: 34 g/dL (ref 30.0–36.0)
MCV: 85.7 fL (ref 80.0–100.0)
Monocytes Absolute: 0.7 10*3/uL (ref 0.1–1.0)
Monocytes Relative: 8 %
Neutro Abs: 5.6 10*3/uL (ref 1.7–7.7)
Neutrophils Relative %: 64 %
Platelets: 226 10*3/uL (ref 150–400)
RBC: 4.84 MIL/uL (ref 3.87–5.11)
RDW: 12.4 % (ref 11.5–15.5)
WBC: 8.5 10*3/uL (ref 4.0–10.5)
nRBC: 0 % (ref 0.0–0.2)

## 2022-01-19 LAB — TYPE AND SCREEN
ABO/RH(D): O POS
Antibody Screen: NEGATIVE

## 2022-01-19 LAB — ABO/RH: ABO/RH(D): O POS

## 2022-01-19 LAB — WET PREP, GENITAL
Clue Cells Wet Prep HPF POC: NONE SEEN
Sperm: NONE SEEN
Trich, Wet Prep: NONE SEEN
WBC, Wet Prep HPF POC: <10
Yeast Wet Prep HPF POC: NONE SEEN

## 2022-01-19 NOTE — ED Triage Notes (Addendum)
Pt presents to ED with complaints of vaginal bleeding. Pt is currently [redacted] weeks pregnant. Pt also having right lower abdominal pain. Pt had tubal pregnancy and had right tube removed 2022. Pt states bleeding is bright red when she wipes. Pt states she has already had an US done this pregnancy confirming this pregnancy is not a tubal pregnancy, she was suppose to go to [facility] 6/17 for [place] but missed her appointment.

## 2022-01-19 NOTE — ED Notes (Signed)
Pt has called out requesting something for pain. MD made aware. No new orders at this time.

## 2022-01-21 ENCOUNTER — Encounter (HOSPITAL_COMMUNITY): Payer: Self-pay

## 2022-01-21 ENCOUNTER — Inpatient Hospital Stay (HOSPITAL_COMMUNITY): Payer: Medicaid Other

## 2022-01-21 ENCOUNTER — Inpatient Hospital Stay (HOSPITAL_COMMUNITY)
Admission: AD | Admit: 2022-01-21 | Discharge: 2022-01-21 | Disposition: A | Discharge status: Home / Self Care | Payer: Medicaid Other | Attending: Obstetrics & Gynecology | Admitting: Obstetrics & Gynecology

## 2022-01-21 ENCOUNTER — Other Ambulatory Visit: Payer: Self-pay

## 2022-01-21 DIAGNOSIS — O039 Complete or unspecified spontaneous abortion without complication: Secondary | ICD-10-CM | POA: Diagnosis not present

## 2022-01-21 DIAGNOSIS — Z3A01 Less than 8 weeks gestation of pregnancy: Secondary | ICD-10-CM | POA: Diagnosis not present

## 2022-01-21 NOTE — MAU Note (Signed)
Pt reports to mau with c/o lower abd pain that started today and vag bleeding that started Friday.  Pt states bleeding started out bright red and is now brown.   States she was seen at [facility] ED and [place] confirmed IUP with heartbeat.  Pt states she is just worried due to pain.

## 2022-01-22 LAB — GC/CHLAMYDIA PROBE AMP (~~LOC~~) NOT AT ARMC
Chlamydia: NEGATIVE
Comment: NEGATIVE
Comment: NORMAL
Neisseria Gonorrhea: NEGATIVE

## 2022-01-24 ENCOUNTER — Encounter: Payer: Self-pay | Admitting: Family Medicine

## 2022-01-24 ENCOUNTER — Ambulatory Visit (INDEPENDENT_AMBULATORY_CARE_PROVIDER_SITE_OTHER): Payer: Self-pay | Admitting: Family Medicine

## 2022-01-24 VITALS — BP 123/88 | HR 91 | Ht 65.0 in | Wt 208.0 lb

## 2022-01-24 DIAGNOSIS — F320 Major depressive disorder, single episode, mild: Secondary | ICD-10-CM

## 2022-01-24 DIAGNOSIS — F32A Depression, unspecified: Secondary | ICD-10-CM | POA: Insufficient documentation

## 2022-01-24 MED ORDER — PAROXETINE HCL 30 MG PO TABS: 30.0000 mg | ORAL_TABLET | Freq: Every day | ORAL | 1 refills | Status: DC

## 2022-01-24 NOTE — Patient Instructions (Signed)
I appreciate the opportunity to provide care to you today!    -please pick up your medication at the pharmacy   -Be sure to drink 6-8 glasses of water daily or 91 ounces daily   Please continue to a heart-healthy diet and increase your physical activities. Try to exercise for at least three times a week.      It was a pleasure to see you and I look forward to continuing to work together on your health and well-being. Please do not hesitate to call the office if you need care or have questions about your care.   Have a wonderful day and week. With Gratitude, Gilmore Laroche MSN, FNP-BC

## 2022-01-24 NOTE — Progress Notes (Signed)
Established Patient Office Visit  Subjective:  Patient ID: Julie Case, female    DOB: 11-04-1990  Age: 31 y.o. MRN: [id number]  CC:  Chief Complaint  Patient presents with   Depression    Pt states she had a miscarriage over the weekend, has been really sad and would like to be put on her depression medication again (paroxetine). Wont be in to see GYN until 02/12/22.     HPI Julie Case is a 31 y.o. female with past medical history of diarrhea presents with a request to refill her depression meds after having a miscarriage. She reports being seen in the ED on 01/19/22 for vaginal bleeding. She had bright red blood, with clots passed earlier during the day before being seen in the ED. She was treated for first-trimester bleeding with instructions to f/u with her OBGYN as soon as possible. [facility], [facility] [place] and was noted to have had a miscarriage.    Past Medical History:  Diagnosis Date   Depression    Epilepsy (HCC)     Past Surgical History:  Procedure Laterality Date   CHOLECYSTECTOMY     ECTOPIC PREGNANCY SURGERY     with right tube removal    Family History  Problem Relation Age of Onset   Hypertension Other    Diabetes Other     Social History   Socioeconomic History   Marital status: Single    Spouse name: Not on file   Number of children: Not on file   Years of education: Not on file   Highest education level: Not on file  Occupational History   Not on file  Tobacco Use   Smoking status: Some Days    Types: Cigarettes   Smokeless tobacco: Never  Vaping Use   Vaping Use: Never used  Substance and Sexual Activity   Alcohol use: Never   Drug use: Never   Sexual activity: Yes  Other Topics Concern   Not on file  Social History Narrative   Not on file   Social Determinants of Health   Financial Resource Strain: Not on file  Food Insecurity: Not on file  Transportation Needs: Not on file  Physical  Activity: Not on file  Stress: Not on file  Social Connections: Not on file  Intimate Partner Violence: Not on file    Outpatient Medications Prior to Visit  Medication Sig Dispense Refill   acetaminophen (TYLENOL) 500 MG tablet Take 500 mg by mouth every 6 (six) hours as needed for mild pain.     famotidine (PEPCID) 20 MG tablet Take 20 mg by mouth daily as needed for heartburn or indigestion.     ondansetron (ZOFRAN-ODT) 4 MG disintegrating tablet Take 1 tablet (4 mg total) by mouth every 8 (eight) hours as needed for nausea or vomiting. 20 tablet 0   PARoxetine (PAXIL) 30 MG tablet Take 30 mg by mouth daily.     No facility-administered medications prior to visit.    Allergies  Allergen Reactions   Penicillins Shortness Of Breath and Rash   Naproxen Nausea And Vomiting    ROS Review of Systems  Constitutional:  Negative for chills, fatigue and fever.  Cardiovascular:  Negative for chest pain and palpitations.  Neurological:  Negative for dizziness, tremors, weakness, light-headedness and headaches.      Objective:    Physical Exam HENT:     Head: Normocephalic.  Cardiovascular:     Rate and  Rhythm: Normal rate and regular rhythm.     Pulses: Normal pulses.     Heart sounds: Normal heart sounds.  Pulmonary:     Effort: Pulmonary effort is normal.     Breath sounds: Normal breath sounds.  Abdominal:     Palpations: Abdomen is soft.  Neurological:     Mental Status: She is alert.     BP 123/88   Pulse 91   Ht 5\' 5"  (1.651 m)   Wt 208 lb (94.3 kg)   LMP 12/01/2021 (Approximate) Comment: had miscarriage  SpO2 97%   Breastfeeding Unknown   BMI 34.61 kg/m  Wt Readings from Last 3 Encounters:  01/24/22 208 lb (94.3 kg)  01/19/22 207 lb 3.7 oz (94 kg)  01/09/22 205 lb 9.6 oz (93.3 kg)    No results found for: "TSH" Lab Results  Component Value Date   WBC 8.5 01/19/2022   HGB 14.1 01/19/2022   HCT 41.5 01/19/2022   MCV 85.7 01/19/2022   PLT 226  01/19/2022   Lab Results  Component Value Date   NA 139 01/19/2022   K 3.9 01/19/2022   CO2 27 01/19/2022   GLUCOSE 95 01/19/2022   BUN 8 01/19/2022   CREATININE 0.52 01/19/2022   BILITOT 0.5 01/19/2022   ALKPHOS 80 01/19/2022   AST 13 (L) 01/19/2022   ALT 17 01/19/2022   PROT 7.3 01/19/2022   ALBUMIN 4.1 01/19/2022   CALCIUM 9.4 01/19/2022   ANIONGAP 6 01/19/2022   No results found for: "CHOL" No results found for: "HDL" No results found for: "LDLCALC" No results found for: "TRIG" No results found for: "CHOLHDL" No results found for: "HGBA1C"    Assessment & Plan:   Problem List Items Addressed This Visit       Other   Depression - Primary    -She had a recent miscarriage on 01/21/22 and would like a refill of her depression medications to cope -Refilled Paxil       Relevant Medications   PARoxetine (PAXIL) 30 MG tablet    Meds ordered this encounter  Medications   PARoxetine (PAXIL) 30 MG tablet    Sig: Take 1 tablet (30 mg total) by mouth daily.    Dispense:  30 tablet    Refill:  1    Follow-up: Return in about 1 year (around 01/25/2023).    01/27/2023, FNP

## 2022-01-24 NOTE — Assessment & Plan Note (Addendum)
-  She had a recent miscarriage on 01/21/22 and would like a refill of her depression medications to cope -Refilled Paxil

## 2022-01-29 ENCOUNTER — Other Ambulatory Visit: Payer: Medicaid Other

## 2022-01-29 DIAGNOSIS — O039 Complete or unspecified spontaneous abortion without complication: Secondary | ICD-10-CM

## 2022-01-30 LAB — BETA HCG QUANT (REF LAB): hCG Quant: 18 m[IU]/mL

## 2022-02-12 ENCOUNTER — Other Ambulatory Visit (HOSPITAL_COMMUNITY)
Admission: RE | Admit: 2022-02-12 | Discharge: 2022-02-12 | Disposition: A | Discharge status: Home / Self Care | Payer: Medicaid Other | Source: Ambulatory Visit | Attending: Obstetrics & Gynecology | Admitting: Obstetrics & Gynecology

## 2022-02-12 ENCOUNTER — Other Ambulatory Visit: Payer: Self-pay | Admitting: Obstetrics & Gynecology

## 2022-02-12 ENCOUNTER — Encounter: Payer: Self-pay | Admitting: Obstetrics & Gynecology

## 2022-02-12 ENCOUNTER — Other Ambulatory Visit: Payer: Medicaid Other

## 2022-02-12 ENCOUNTER — Ambulatory Visit (INDEPENDENT_AMBULATORY_CARE_PROVIDER_SITE_OTHER): Payer: Medicaid Other | Admitting: Obstetrics & Gynecology

## 2022-02-12 VITALS — BP 123/82 | HR 77 | Ht 65.0 in | Wt 206.8 lb

## 2022-02-12 DIAGNOSIS — Z124 Encounter for screening for malignant neoplasm of cervix: Secondary | ICD-10-CM | POA: Diagnosis not present

## 2022-02-12 DIAGNOSIS — Z30011 Encounter for initial prescription of contraceptive pills: Secondary | ICD-10-CM

## 2022-02-12 DIAGNOSIS — O039 Complete or unspecified spontaneous abortion without complication: Secondary | ICD-10-CM | POA: Diagnosis not present

## 2022-02-12 MED ORDER — NORETHINDRONE 0.35 MG PO TABS: 1.0000 | ORAL_TABLET | Freq: Every day | ORAL | 4 refills | Status: DC

## 2022-02-12 NOTE — Addendum Note (Signed)
Addended by: Annamarie Dawley on: 02/12/2022 12:10 PM   Modules accepted: Orders

## 2022-02-12 NOTE — Progress Notes (Signed)
   GYN VISIT Patient name: Julie Case MRN [id number]  Date of birth: May 28, 1991 Chief Complaint:   Miscarriage  History of Present Illness:   Julie Case is a 31 y.o. [id number]  female being seen today for miscarriage follow up.  ER records reviewed- pt with HCG 18 and [place] that showed no IUP.  She had presented due to abdominal pain and bleeding.  Today she notes that her bleeding has resolved.  She denies pelvic or abdominal pain.  No fever/chills.  No acute complaints.  Patient's last menstrual period was 12/01/2021 (approximate).     01/24/2022    2:27 PM 01/09/2022    1:16 PM  Depression screen PHQ 2/9  Decreased Interest 3 0  Down, Depressed, Hopeless 3 0  PHQ - 2 Score 6 0  Altered sleeping 3   Tired, decreased energy 3   Change in appetite 3   Feeling bad or failure about yourself  3   Trouble concentrating 0   Moving slowly or fidgety/restless 3   Suicidal thoughts 3   PHQ-9 Score 24   Difficult doing work/chores Very difficult      Review of Systems:   Pertinent items are noted in HPI Denies fever/chills, dizziness, headaches, visual disturbances, fatigue, shortness of breath, chest pain, abdominal pain, vomiting, no problems with periods, bowel movements, urination, or intercourse unless otherwise stated above.  Pertinent History Reviewed:  Reviewed past medical,surgical, social, obstetrical and family history.  Reviewed problem list, medications and allergies. Physical Assessment:   Vitals:   02/12/22 1011  BP: 123/82  Pulse: 77  Weight: 206 lb 12.8 oz (93.8 kg)  Height: 5\' 5"  (1.651 m)  Body mass index is 34.41 kg/m.       Physical Examination:   General appearance: alert, well appearing, and in no distress  Psych: mood appropriate, normal affect  Skin: warm & dry   Cardiovascular: normal heart rate noted  Respiratory: normal respiratory effort, no distress  Abdomen: obese, soft, non-tender   Pelvic: VULVA: normal appearing vulva with no masses, tenderness  or lesions, VAGINA: normal appearing vagina with normal color and discharge, no lesions, CERVIX: normal appearing cervix without discharge or lesions  Extremities: no edema   Chaperone:  Dr.     Assessment & Plan:  1) Miscarriage -hCG today to confirm non-pregnancy state -does want another child, but maybe next year -mood appropriate  2) Contraception -reviewed options- tried Depot, NExplanon and Mirena in the past.  Per pt became pregnant/Mirena fell out with her last pregnancy -plan for POPs due to h/o tobacco use and seizure d/o  3) Cervical cancer screening -pap collected today  Meds ordered this encounter  Medications   norethindrone (MICRONOR) 0.35 MG tablet    Sig: Take 1 tablet (0.35 mg total) by mouth daily.    Dispense:  90 tablet    Refill:  4     Maury Dus, DO Attending Obstetrician & Gynecologist, [facility] for [facility]

## 2022-02-13 LAB — BETA HCG QUANT (REF LAB): hCG Quant: <1 m[IU]/mL

## 2022-02-15 LAB — CYTOLOGY - PAP
Chlamydia: NEGATIVE
Comment: NEGATIVE
Comment: NEGATIVE
Comment: NORMAL
Diagnosis: NEGATIVE
High risk HPV: NEGATIVE
Neisseria Gonorrhea: NEGATIVE

## 2022-03-01 ENCOUNTER — Ambulatory Visit (INDEPENDENT_AMBULATORY_CARE_PROVIDER_SITE_OTHER): Payer: Medicaid Other | Admitting: Family Medicine

## 2022-03-01 ENCOUNTER — Encounter: Payer: Self-pay | Admitting: Family Medicine

## 2022-03-01 VITALS — BP 119/75 | HR 76 | Ht 65.0 in | Wt 210.1 lb

## 2022-03-01 DIAGNOSIS — M5442 Lumbago with sciatica, left side: Secondary | ICD-10-CM

## 2022-03-01 DIAGNOSIS — S3992XA Unspecified injury of lower back, initial encounter: Secondary | ICD-10-CM | POA: Diagnosis not present

## 2022-03-01 MED ORDER — OXYCODONE-ACETAMINOPHEN 10-325 MG PO TABS: 1.0000 | ORAL_TABLET | Freq: Three times a day (TID) | ORAL | 0 refills | Status: AC | PRN

## 2022-03-01 MED ORDER — CYCLOBENZAPRINE HCL 5 MG PO TABS: 5.0000 mg | ORAL_TABLET | Freq: Three times a day (TID) | ORAL | 1 refills | Status: DC | PRN

## 2022-03-01 NOTE — Patient Instructions (Signed)
I appreciate the opportunity to provide care to you today!   Please pick up your medication at the pharmacy   Please continue to a heart-healthy diet and increase your physical activities. Try to exercise for 30mins at least three times a week.      It was a pleasure to see you and I look forward to continuing to work together on your health and well-being. Please do not hesitate to call the office if you need care or have questions about your care.   Have a wonderful day and week. With Gratitude, Annemarie Sebree MSN, FNP-BC  

## 2022-03-01 NOTE — Assessment & Plan Note (Signed)
Onset of symptom was 02/27/22 She reports a popping sound in her lower back while sweeping Since then, she has been complaining of severe back pain that radiates to her left leg Pain is rated 10/10. She describes pain in her legs as pins and needles No bowel or bladder incontinence was noted She reports using heating pads and taking Tylenol with minimum relief Imaging studies ordered Will give a short course of Percocet, given that she cannot take NSAIDs Pt advised not to take Tylenol 500mg  while taking percoet

## 2022-03-01 NOTE — Progress Notes (Signed)
Established Patient Office Visit  Subjective:  Patient ID: Julie Case, female    DOB: 1990/09/04  Age: 31 y.o. MRN: [id number]  CC:  Chief Complaint  Patient presents with   Back Pain    Pt c/o hurting her back 2 days ago from doing house cleaning, sweeping and mopping. Back pain is shooting down both her legs when she walks.     HPI Julie Case is a 31 y.o. female with past medical history of depression  presents with c/o of low back pain Low back pain with left side sciatica: onset of symptom was 02/27/22. She reports a popping sound in her lower back while sweeping. Since then, she has been complaining of severe back pain that radiates to her left leg. Pain is rated 10/10. She describes pain in her legs as pins and needles. No bowel or bladder incontinence was noted. She reports using heating pads and taking Tylenol with minimum relief.   Past Medical History:  Diagnosis Date   Depression    Epilepsy (HCC)     Past Surgical History:  Procedure Laterality Date   CHOLECYSTECTOMY     ECTOPIC PREGNANCY SURGERY     with right tube removal    Family History  Problem Relation Age of Onset   Hypertension Other    Diabetes Other     Social History   Socioeconomic History   Marital status: Single    Spouse name: Not on file   Number of children: Not on file   Years of education: Not on file   Highest education level: Not on file  Occupational History   Not on file  Tobacco Use   Smoking status: Some Days    Types: Cigarettes   Smokeless tobacco: Never  Vaping Use   Vaping Use: Never used  Substance and Sexual Activity   Alcohol use: Never   Drug use: Never   Sexual activity: Yes    Birth control/protection: None  Other Topics Concern   Not on file  Social History Narrative   Not on file   Social Determinants of Health   Financial Resource Strain: Not on file  Food Insecurity: Not on file  Transportation Needs: Not on file  Physical Activity: Not on file   Stress: Not on file  Social Connections: Not on file  Intimate Partner Violence: Not on file    Outpatient Medications Prior to Visit  Medication Sig Dispense Refill   acetaminophen (TYLENOL) 500 MG tablet Take 500 mg by mouth every 6 (six) hours as needed for mild pain.     famotidine (PEPCID) 20 MG tablet Take 20 mg by mouth daily as needed for heartburn or indigestion.     norethindrone (MICRONOR) 0.35 MG tablet Take 1 tablet (0.35 mg total) by mouth daily. 90 tablet 4   PARoxetine (PAXIL) 30 MG tablet Take 1 tablet (30 mg total) by mouth daily. 30 tablet 1   ondansetron (ZOFRAN-ODT) 4 MG disintegrating tablet Take 1 tablet (4 mg total) by mouth every 8 (eight) hours as needed for nausea or vomiting. (Patient not taking: Reported on 03/01/2022) 20 tablet 0   No facility-administered medications prior to visit.    Allergies  Allergen Reactions   Penicillins Shortness Of Breath and Rash   Naproxen Nausea And Vomiting    ROS Review of Systems  Constitutional:  Negative for chills, fatigue and fever.  Respiratory:  Negative for chest tightness and shortness of breath.   Cardiovascular:  Negative for chest pain.  Musculoskeletal:  Positive for back pain.  Neurological:  Negative for dizziness, tremors, weakness and headaches.      Objective:    Physical Exam Cardiovascular:     Rate and Rhythm: Normal rate and regular rhythm.  Musculoskeletal:     Lumbar back: Tenderness present. No swelling, deformity, lacerations or spasms. No scoliosis.  Neurological:     Mental Status: She is alert.     BP 119/75   Pulse 76   Ht 5\' 5"  (1.651 m)   Wt 210 lb 1.3 oz (95.3 kg)   LMP 12/01/2021 (Approximate)   SpO2 96%   BMI 34.96 kg/m  Wt Readings from Last 3 Encounters:  03/01/22 210 lb 1.3 oz (95.3 kg)  02/12/22 206 lb 12.8 oz (93.8 kg)  01/24/22 208 lb (94.3 kg)    No results found for: "TSH" Lab Results  Component Value Date   WBC 8.5 01/19/2022   HGB 14.1 01/19/2022    HCT 41.5 01/19/2022   MCV 85.7 01/19/2022   PLT 226 01/19/2022   Lab Results  Component Value Date   NA 139 01/19/2022   K 3.9 01/19/2022   CO2 27 01/19/2022   GLUCOSE 95 01/19/2022   BUN 8 01/19/2022   CREATININE 0.52 01/19/2022   BILITOT 0.5 01/19/2022   ALKPHOS 80 01/19/2022   AST 13 (L) 01/19/2022   ALT 17 01/19/2022   PROT 7.3 01/19/2022   ALBUMIN 4.1 01/19/2022   CALCIUM 9.4 01/19/2022   ANIONGAP 6 01/19/2022   No results found for: "CHOL" No results found for: "HDL" No results found for: "LDLCALC" No results found for: "TRIG" No results found for: "CHOLHDL" No results found for: "HGBA1C"    Assessment & Plan:   Problem List Items Addressed This Visit       Nervous and Auditory   Low back pain with left-sided sciatica    Onset of symptom was 02/27/22 She reports a popping sound in her lower back while sweeping Since then, she has been complaining of severe back pain that radiates to her left leg Pain is rated 10/10. She describes pain in her legs as pins and needles No bowel or bladder incontinence was noted She reports using heating pads and taking Tylenol with minimum relief Imaging studies ordered Will give a short course of Percocet, given that she cannot take NSAIDs Pt advised not to take Tylenol 500mg  while taking percoet       Relevant Medications   oxyCODONE-acetaminophen (PERCOCET) 10-325 MG tablet   Other Visit Diagnoses     Back injury, initial encounter    -  Primary   Relevant Medications   oxyCODONE-acetaminophen (PERCOCET) 10-325 MG tablet   Other Relevant Orders   DG Lumbar Spine Complete       Meds ordered this encounter  Medications   DISCONTD: cyclobenzaprine (FLEXERIL) 5 MG tablet    Sig: Take 1 tablet (5 mg total) by mouth 3 (three) times daily as needed for muscle spasms.    Dispense:  30 tablet    Refill:  1   oxyCODONE-acetaminophen (PERCOCET) 10-325 MG tablet    Sig: Take 1 tablet by mouth every 8 (eight) hours as  needed for up to 7 days for pain.    Dispense:  15 tablet    Refill:  0    Follow-up: Return if symptoms worsen or fail to improve.    04/29/22, FNP

## 2022-03-18 ENCOUNTER — Emergency Department (HOSPITAL_COMMUNITY): Payer: Medicaid Other

## 2022-03-18 ENCOUNTER — Emergency Department (HOSPITAL_COMMUNITY)
Admission: EM | Admit: 2022-03-18 | Discharge: 2022-03-18 | Disposition: A | Discharge status: Home / Self Care | Payer: Medicaid Other | Attending: Emergency Medicine | Admitting: Emergency Medicine

## 2022-03-18 ENCOUNTER — Other Ambulatory Visit: Payer: Self-pay

## 2022-03-18 ENCOUNTER — Encounter (HOSPITAL_COMMUNITY): Payer: Self-pay | Admitting: *Deleted

## 2022-03-18 DIAGNOSIS — Z3A01 Less than 8 weeks gestation of pregnancy: Secondary | ICD-10-CM | POA: Diagnosis not present

## 2022-03-18 DIAGNOSIS — O99891 Other specified diseases and conditions complicating pregnancy: Secondary | ICD-10-CM | POA: Diagnosis not present

## 2022-03-18 DIAGNOSIS — M545 Low back pain, unspecified: Secondary | ICD-10-CM | POA: Diagnosis not present

## 2022-03-18 DIAGNOSIS — M5136 Other intervertebral disc degeneration, lumbar region: Secondary | ICD-10-CM | POA: Insufficient documentation

## 2022-03-18 MED ORDER — LIDOCAINE 5 % EX PTCH
1.0000 | MEDICATED_PATCH | CUTANEOUS | Status: DC
  Administered 2022-03-18: 1 via TRANSDERMAL
  Filled 2022-03-18: qty 1

## 2022-03-18 MED ORDER — KETOROLAC TROMETHAMINE 15 MG/ML IJ SOLN
15.0000 mg | Freq: Once | INTRAMUSCULAR | Status: AC
  Administered 2022-03-18: 15 mg via INTRAMUSCULAR
  Filled 2022-03-18: qty 1

## 2022-03-18 MED ORDER — IBUPROFEN 600 MG PO TABS: 600.0000 mg | ORAL_TABLET | Freq: Four times a day (QID) | ORAL | 0 refills | Status: DC | PRN

## 2022-03-18 MED ORDER — OXYCODONE-ACETAMINOPHEN 5-325 MG PO TABS
2.0000 | ORAL_TABLET | Freq: Once | ORAL | Status: AC
  Administered 2022-03-18: 2 via ORAL
  Filled 2022-03-18: qty 2

## 2022-03-18 NOTE — Discharge Instructions (Signed)
Please use 600 mg ibuprofen every 6 hours for pain control.  You can also alternate with Tylenol.  I would take with food.  Please stop taking ibuprofen immediately if you begin having black stools or if you start vomiting blood.  I would like for you to follow-up with orthopedics for further evaluation. Please return to the ED for further evaluation if symptoms worsen.

## 2022-03-18 NOTE — ED Provider Notes (Signed)
[facility] EMERGENCY DEPARTMENT Provider Note   CSN: [id number] Arrival date & time: 03/18/22  1454     History Chief Complaint  Patient presents with   Back Pain    Julie Case is a 31 y.o. female patient who presents to the emergency department today for further evaluation of diffuse lower back pain that is been ongoing for the last month.  She states that it started when she was cleaning and was bending down and felt a popping sensation in her back.  A couple of days later she was cooking and felt her back give out which caused her to lower self to the floor.  Since then she has been having constant back pain since then.  She reports associated subjective numbness to both legs.  Pain radiates down both legs.  She denies any bowel or bladder incontinence.   Back Pain      Home Medications Prior to Admission medications   Medication Sig Start Date End Date Taking? Authorizing Provider  ibuprofen (ADVIL) 600 MG tablet Take 1 tablet (600 mg total) by mouth every 6 (six) hours as needed. 03/18/22  Yes Meredeth Ide, Donalyn Schneeberger M, PA-C  acetaminophen (TYLENOL) 500 MG tablet Take 500 mg by mouth every 6 (six) hours as needed for mild pain.    [provider]  famotidine (PEPCID) 20 MG tablet Take 20 mg by mouth daily as needed for heartburn or indigestion.    [provider]  norethindrone (MICRONOR) 0.35 MG tablet Take 1 tablet (0.35 mg total) by mouth daily. 02/12/22 05/13/22  Myna Hidalgo, DO  ondansetron (ZOFRAN-ODT) 4 MG disintegrating tablet Take 1 tablet (4 mg total) by mouth every 8 (eight) hours as needed for nausea or vomiting. Patient not taking: Reported on 03/01/2022 01/06/22   Cecil Cobbs, PA-C  PARoxetine (PAXIL) 30 MG tablet Take 1 tablet (30 mg total) by mouth daily. 01/24/22   Gilmore Laroche, FNP      Allergies    Penicillins and Naproxen    Review of Systems   Review of Systems  Musculoskeletal:  Positive for back pain.  All other systems reviewed  and are negative.   Physical Exam Updated Vital Signs BP 138/88 (BP Location: Right Arm)   Pulse 72   Temp 98 F (36.7 C) (Oral)   Resp 16   Ht 5\' 5"  (1.651 m)   Wt 90.7 kg   LMP 02/23/2022 (Approximate)   SpO2 100%   Breastfeeding No   BMI 33.28 kg/m  Physical Exam Vitals and nursing note reviewed.  Constitutional:      Appearance: Normal appearance.  HENT:     Head: Normocephalic and atraumatic.  Eyes:     General:        Right eye: No discharge.        Left eye: No discharge.     Conjunctiva/sclera: Conjunctivae normal.  Pulmonary:     Effort: Pulmonary effort is normal.  Musculoskeletal:     Comments: Diffuse tenderness across the lower lumbar spine.  5/5 strength to the lower extremities.  Normal sensation to the lower extremities.  Skin:    General: Skin is warm and dry.     Findings: No rash.  Neurological:     General: No focal deficit present.     Mental Status: She is alert.  Psychiatric:        Mood and Affect: Mood normal.        Behavior: Behavior normal.     ED Results /  Procedures / Treatments   Labs (all labs ordered are listed, but only abnormal results are displayed) Labs Reviewed - No data to display  EKG None  Radiology DG Lumbar Spine Complete  Result Date: 03/18/2022 CLINICAL DATA:  Midline back pain. EXAM: LUMBAR SPINE - COMPLETE 4+ VIEW COMPARISON:  None Available. FINDINGS: There is no evidence of lumbar spine fracture. Straightening of the lumbar spine. Mild multilevel degenerate disc disease with disc height loss and marginal osteophytes prominent at L4-L5 and L5-S1 with associated mild facet joint arthropathy. IMPRESSION: 1. No acute fracture or traumatic subluxation. 2. Mild multilevel degenerative disease prominent at L4-L5 and L5-S1 with associated facet joint arthropathy. Electronically Signed   By: Larose Hires D.O.   On: 03/18/2022 15:49    Procedures Procedures    Medications Ordered in ED Medications  lidocaine  (LIDODERM) 5 % 1 patch (1 patch Transdermal Patch Applied 03/18/22 1543)  ketorolac (TORADOL) 15 MG/ML injection 15 mg (15 mg Intramuscular Given 03/18/22 1544)  oxyCODONE-acetaminophen (PERCOCET/ROXICET) 5-325 MG per tablet 2 tablet (2 tablets Oral Given 03/18/22 1619)    ED Course/ Medical Decision Making/ A&P Clinical Course as of 03/18/22 1704  Sun Mar 18, 2022  1609 DG Lumbar Spine Complete I personally ordered and interpreted this study.  No evidence of fracture.  There is some evidence of arthritis in the lower lumbar spine.  This is likely what is causing her pain.  I do agree with the radiologist interpretation. [CF]  1609 On reevaluation, patient is not feeling much better after Toradol and lidocaine patch.  I will give her 2 Percocet and plan to reassess once more. [CF]  1703 On repeat evaluation, patient feeling better after 2 of Percocet and Toradol.  I will prescribe her ibuprofen to go home with.  I went over all imaging with her at bedside.  I will provide her with orthopedics for follow-up.  She is safe for discharge at this time. [CF]    Clinical Course User Index [CF] Teressa Lower, PA-C                           Medical Decision Making Tira Lafferty is a 31 y.o. female patient who presents to the emergency department today for further evaluation of lower back pain.  Considering the time course, I will get imaging of the lumbar spine to further evaluate.  I will also give her Toradol and lidocaine patch.  I will plan to reassess.  Have a low suspicion for cauda equina syndrome at this time.  I will have her follow-up with orthopedics.  This is likely secondary to arthritis in her back.  Patient's pain is controlled.  She is safe for discharge at this time.  Strict return precautions were discussed.  Amount and/or Complexity of Data Reviewed Radiology: ordered. Decision-making details documented in ED Course.  Risk Prescription drug management.    Final Clinical  Impression(s) / ED Diagnoses Final diagnoses:  Acute bilateral low back pain, unspecified whether sciatica present  DDD (degenerative disc disease), lumbar    Rx / DC Orders ED Discharge Orders          Ordered    ibuprofen (ADVIL) 600 MG tablet  Every 6 hours PRN        03/18/22 1701              Honor Loh [place], [place] 03/18/22 1704    Bethann Berkshire, MD 03/19/22 [phone]

## 2022-03-18 NOTE — ED Notes (Signed)
Patient transported to x-ray for complete lumbar spine

## 2022-03-18 NOTE — ED Triage Notes (Signed)
Pt with lower back, fell few days ago.  Seen her doctor and wanted her to have xrays done but did not come then.  Pt tearful in triage.

## 2022-03-24 ENCOUNTER — Other Ambulatory Visit: Payer: Self-pay | Admitting: Family Medicine

## 2022-03-24 DIAGNOSIS — S3992XA Unspecified injury of lower back, initial encounter: Secondary | ICD-10-CM

## 2022-04-11 ENCOUNTER — Ambulatory Visit (INDEPENDENT_AMBULATORY_CARE_PROVIDER_SITE_OTHER): Payer: Medicaid Other | Admitting: Family Medicine

## 2022-04-11 ENCOUNTER — Encounter: Payer: Self-pay | Admitting: Family Medicine

## 2022-04-11 VITALS — BP 138/86 | HR 93 | Ht 65.0 in | Wt 217.0 lb

## 2022-04-11 DIAGNOSIS — F320 Major depressive disorder, single episode, mild: Secondary | ICD-10-CM | POA: Diagnosis not present

## 2022-04-11 DIAGNOSIS — J069 Acute upper respiratory infection, unspecified: Secondary | ICD-10-CM | POA: Insufficient documentation

## 2022-04-11 MED ORDER — NOREL AD 4-10-325 MG PO TABS
ORAL_TABLET | ORAL | 0 refills | Status: DC
Start: 2022-04-11 — End: 2022-06-14

## 2022-04-11 MED ORDER — BENZONATATE 100 MG PO CAPS: 100.0000 mg | ORAL_CAPSULE | Freq: Two times a day (BID) | ORAL | 0 refills | Status: DC | PRN

## 2022-04-11 MED ORDER — PAROXETINE HCL 30 MG PO TABS
30.0000 mg | ORAL_TABLET | Freq: Every day | ORAL | 1 refills | Status: DC
Start: 2022-04-11 — End: 2022-07-13

## 2022-04-11 NOTE — Assessment & Plan Note (Addendum)
Norel- AD ordered Informed not to take >6 tablets in 24hrs Tessalon ordered a cough Encouraged to continue warm salt gargles for sore throat Encouraged to take a home Covid test for faster results and let me know if the results are positive

## 2022-04-11 NOTE — Patient Instructions (Addendum)
I appreciate the opportunity to provide care to you today!    Follow up:  3 months  Labs: next visit  Please pick up your medication at the pharmacy   Please continue to a heart-healthy diet and increase your physical activities. Try to exercise for at least three times a week.      It was a pleasure to see you and I look forward to continuing to work together on your health and well-being. Please do not hesitate to call the office if you need care or have questions about your care.   Have a wonderful day and week. With Gratitude, Gilmore Laroche MSN, FNP-BC

## 2022-04-11 NOTE — Assessment & Plan Note (Signed)
Stable on paxil 30 mg daily Denies SI/HI Refilled paxil 30 mg

## 2022-04-11 NOTE — Assessment & Plan Note (Signed)
Denies symptoms of back pain today

## 2022-04-11 NOTE — Progress Notes (Signed)
Established Patient Office Visit  Subjective:  Patient ID: Julie Case, female    DOB: [birth year]  Age: 31 y.o. MRN: [id number]  CC:  Chief Complaint  Patient presents with   Follow-up    3 month f/u.    URI    Pt c/o nasal congestion, chills, sore throat today, onset of sx began 2 days ago. Also has right ear pain.     HPI Julie Case is a 31 y.o. 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 Viral URI: The onset of symptoms was two days ago. She c/o nasal congestion, right ear pain, rhinorrhea, sinus pain and pressure, sneezing, and sore throat. She denies fever and SOB. She reports recent sick contacts and has been using otc treatments. She reports gargling with warm salt water for relief of her sore throat.   Depression: stable on Paxil. She denies SI/HI.    Past Medical History:  Diagnosis Date   Depression    Epilepsy ([place])     Past Surgical History:  Procedure Laterality Date   CHOLECYSTECTOMY     ECTOPIC PREGNANCY SURGERY     with right tube removal    Family History  Problem Relation Age of Onset   Hypertension Other    Diabetes Other     Social History   Socioeconomic History   Marital status: Single    Spouse name: Not on file   Number of children: Not on file   Years of education: Not on file   Highest education level: Not on file  Occupational History   Not on file  Tobacco Use   Smoking status: Every Day    Types: Cigarettes   Smokeless tobacco: Never  Vaping Use   Vaping Use: Never used  Substance and Sexual Activity   Alcohol use: Never   Drug use: Never   Sexual activity: Yes    Birth control/protection: None  Other Topics Concern   Not on file  Social History Narrative   Not on file   Social Determinants of Health   Financial Resource Strain: Not on file  Food Insecurity: Not on file  Transportation Needs: Not on file  Physical Activity: Not on file   Stress: Not on file  Social Connections: Not on file  Intimate Partner Violence: Not on file    Outpatient Medications Prior to Visit  Medication Sig Dispense Refill   acetaminophen (TYLENOL) 500 MG tablet Take 500 mg by mouth every 6 (six) hours as needed for mild pain.     famotidine (PEPCID) 20 MG tablet Take 20 mg by mouth daily as needed for heartburn or indigestion.     ibuprofen (ADVIL) 600 MG tablet Take 1 tablet (600 mg total) by mouth every 6 (six) hours as needed. 30 tablet 0   norethindrone (MICRONOR) 0.35 MG tablet Take 1 tablet (0.35 mg total) by mouth daily. 90 tablet 4   ondansetron (ZOFRAN-ODT) 4 MG disintegrating tablet Take 1 tablet (4 mg total) by mouth every 8 (eight) hours as needed for nausea or vomiting. 20 tablet 0   PARoxetine (PAXIL) 30 MG tablet Take 1 tablet (30 mg total) by mouth daily. 30 tablet 1   No facility-administered medications prior to visit.    Allergies  Allergen Reactions   Penicillins Shortness Of Breath and Rash   Naproxen Nausea And Vomiting    ROS Review of Systems  Constitutional:  Negative for fatigue and  fever.  HENT:  Positive for congestion, rhinorrhea, sinus pressure, sinus pain, sneezing and sore throat. Negative for ear discharge, ear pain and postnasal drip.   Respiratory:  Positive for cough. Negative for chest tightness and shortness of breath.   Cardiovascular:  Negative for chest pain and palpitations.  Gastrointestinal:  Negative for nausea and vomiting.  Neurological:  Negative for dizziness and headaches.  Psychiatric/Behavioral:  Negative for self-injury and suicidal ideas.       Objective:    Physical Exam HENT:     Head: Normocephalic.     Right Ear: Tympanic membrane normal. No drainage or tenderness.     Left Ear: Tympanic membrane normal. No drainage or tenderness.     Nose: Congestion and rhinorrhea present.  Cardiovascular:     Rate and Rhythm: Normal rate and regular rhythm.     Pulses: Normal  pulses.     Heart sounds: Normal heart sounds.  Pulmonary:     Effort: Pulmonary effort is normal.     Breath sounds: Normal breath sounds.  Neurological:     Mental Status: She is alert.     BP 138/86   Pulse 93   Ht 5\' 5"  (1.651 m)   Wt 217 lb 0.6 oz (98.4 kg)   LMP 02/23/2022 (Approximate)   SpO2 98%   Breastfeeding Unknown   BMI 36.12 kg/m  Wt Readings from Last 3 Encounters:  04/11/22 217 lb 0.6 oz (98.4 kg)  03/18/22 200 lb (90.7 kg)  03/01/22 210 lb 1.3 oz (95.3 kg)    No results found for: "TSH" Lab Results  Component Value Date   WBC 8.5 01/19/2022   HGB 14.1 01/19/2022   HCT 41.5 01/19/2022   MCV 85.7 01/19/2022   PLT 226 01/19/2022   Lab Results  Component Value Date   NA 139 01/19/2022   K 3.9 01/19/2022   CO2 27 01/19/2022   GLUCOSE 95 01/19/2022   BUN 8 01/19/2022   CREATININE 0.52 01/19/2022   BILITOT 0.5 01/19/2022   ALKPHOS 80 01/19/2022   AST 13 (L) 01/19/2022   ALT 17 01/19/2022   PROT 7.3 01/19/2022   ALBUMIN 4.1 01/19/2022   CALCIUM 9.4 01/19/2022   ANIONGAP 6 01/19/2022   No results found for: "CHOL" No results found for: "HDL" No results found for: "LDLCALC" No results found for: "TRIG" No results found for: "CHOLHDL" No results found for: "HGBA1C"    Assessment & Plan:   Problem List Items Addressed This Visit       Respiratory   Acute URI    Norel- AD ordered Informed not to take >6 tablets in 24hrs Tessalon ordered a cough Encouraged to continue warm salt gargles for sore throat Encouraged to take a home Covid test for faster results and let me know if the results are positive        Other   Depression    Stable on paxil 30 mg daily Denies SI/HI Refilled paxil 30 mg      Relevant Medications   PARoxetine (PAXIL) 30 MG tablet   Other Visit Diagnoses     Viral upper respiratory tract infection    -  Primary   Relevant Medications   Chlorphen-PE-Acetaminophen (NOREL AD) 4-10-325 MG TABS   benzonatate  (TESSALON) 100 MG capsule       Meds ordered this encounter  Medications   PARoxetine (PAXIL) 30 MG tablet    Sig: Take 1 tablet (30 mg total) by mouth daily.  Dispense:  30 tablet    Refill:  1   Chlorphen-PE-Acetaminophen (NOREL AD) 4-10-325 MG TABS    Sig: Take One(1) tablet every 4 hrs: do not take more than 6 tablet in 24hrs    Dispense:  30 tablet    Refill:  0   benzonatate (TESSALON) 100 MG capsule    Sig: Take 1 capsule (100 mg total) by mouth 2 (two) times daily as needed for cough.    Dispense:  20 capsule    Refill:  0    Follow-up: Return in about 3 months (around 07/11/2022).    Gilmore Laroche, FNP

## 2022-05-07 ENCOUNTER — Other Ambulatory Visit: Payer: Self-pay

## 2022-05-07 ENCOUNTER — Encounter (HOSPITAL_COMMUNITY): Payer: Self-pay

## 2022-05-07 ENCOUNTER — Emergency Department (HOSPITAL_COMMUNITY)
Admission: EM | Admit: 2022-05-07 | Discharge: 2022-05-07 | Discharge status: Against Medical Advice | Payer: Medicaid Other | Attending: Emergency Medicine | Admitting: Emergency Medicine

## 2022-05-07 DIAGNOSIS — J3489 Other specified disorders of nose and nasal sinuses: Secondary | ICD-10-CM | POA: Insufficient documentation

## 2022-05-07 DIAGNOSIS — Z5321 Procedure and treatment not carried out due to patient leaving prior to being seen by health care provider: Secondary | ICD-10-CM | POA: Insufficient documentation

## 2022-05-07 NOTE — ED Triage Notes (Signed)
Pov from home. Cc of nose nostril pain from nose ring. States that she tried to change the piercing but now cant get it to go all the way through and states she cannot pull it out either Got it done in Feb.

## 2022-05-09 ENCOUNTER — Ambulatory Visit (INDEPENDENT_AMBULATORY_CARE_PROVIDER_SITE_OTHER): Payer: Medicaid Other | Admitting: Family Medicine

## 2022-05-09 ENCOUNTER — Encounter: Payer: Self-pay | Admitting: Family Medicine

## 2022-05-09 VITALS — BP 118/78 | HR 94 | Ht 65.0 in | Wt 218.1 lb

## 2022-05-09 DIAGNOSIS — Z2821 Immunization not carried out because of patient refusal: Secondary | ICD-10-CM | POA: Diagnosis not present

## 2022-05-09 DIAGNOSIS — S0123XA Puncture wound without foreign body of nose, initial encounter: Secondary | ICD-10-CM | POA: Diagnosis not present

## 2022-05-09 NOTE — Progress Notes (Signed)
   Acute Office Visit  Subjective:     Patient ID: Julie Case, female    DOB: 10-27-1990, 31 y.o.   MRN: [id number]  Chief Complaint  Patient presents with   Facial Pain    Pt reports nose ring stuck unable to get nose ring out, pain is a 10/10, nose is red in the inside and a little swelling.     HPI Patient is in today complaining that her nose ring is stuck in her left nostril, and she cannot remove it.  She rates pain 10 out of 10 when removing her nose ring.  She reports increased swelling and redness.  She denies foul odor, congestion, or drainage from the left nostril.  Review of Systems  Constitutional:  Negative for chills and fever.  HENT:         Left nostril pain  Cardiovascular:  Negative for orthopnea.  Gastrointestinal:  Negative for abdominal pain, nausea and vomiting.  Genitourinary:  Negative for frequency and urgency.        Objective:    BP 118/78   Pulse 94   Ht 5\' 5"  (1.651 m)   Wt 218 lb 1.3 oz (98.9 kg)   LMP 04/14/2022 (Approximate)   SpO2 98%   Breastfeeding No   BMI 36.29 kg/m    Physical Exam HENT:     Head: Normocephalic.     Nose:     Comments: Tenderness with palpation of the left nostril  Cardiovascular:     Rate and Rhythm: Normal rate and regular rhythm.     Pulses: Normal pulses.     Heart sounds: Normal heart sounds.  Pulmonary:     Effort: Pulmonary effort is normal.     Breath sounds: Normal breath sounds.  Neurological:     Mental Status: She is alert.     No results found for any visits on 05/09/22.      Assessment & Plan:   Problem List Items Addressed This Visit       Other   Piercing of left nostril - Primary    She has had her nose ring since February 2023 She complains that her nose ring is stuck in her left nostril and she cannot remove it Advised to use nasal saline in the left nostril to keep it moisturized Advised to not try to remove the nose ring because doing so will  increase the  inflammation Advised to wait at least 2 to 3 days for the inflammation to subside before attempting to remove the nose ring Take tylenol as needed for pain      Other Visit Diagnoses     Refused influenza vaccine           No orders of the defined types were placed in this encounter.   Return if symptoms worsen or fail to improve.  Alvira Monday, FNP

## 2022-05-09 NOTE — Assessment & Plan Note (Signed)
She has had her nose ring since February 2023 She complains that her nose ring is stuck in her left nostril and she cannot remove it Advised to use nasal saline in the left nostril to keep it moisturized Advised to not try to remove the nose ring because doing so will  increase the inflammation Advised to wait at least 2 to 3 days for the inflammation to subside before attempting to remove the nose ring Take tylenol as needed for pain

## 2022-05-09 NOTE — Patient Instructions (Addendum)
I appreciate the opportunity to provide care to you today!    Follow up:  07/13/22  Advised to use nasal saline in the left nostril to keep it moisturized Advised to not try to remove the nose ring because doing so will  increase the inflammation Advised to wait at least 2 to 3 days for the inflammation to subside before attempting to remove the nose ring Take tylenol as needed for pain      Please continue to a heart-healthy diet and increase your physical activities. Try to exercise for 31mins at least three times a week.      It was a pleasure to see you and I look forward to continuing to work together on your health and well-being. Please do not hesitate to call the office if you need care or have questions about your care.   Have a wonderful day and week. With Gratitude, Alvira Monday MSN, FNP-BC

## 2022-05-13 ENCOUNTER — Encounter (HOSPITAL_COMMUNITY): Payer: Self-pay | Admitting: Emergency Medicine

## 2022-05-13 ENCOUNTER — Other Ambulatory Visit: Payer: Self-pay

## 2022-05-13 ENCOUNTER — Emergency Department (HOSPITAL_COMMUNITY)
Admission: EM | Admit: 2022-05-13 | Discharge: 2022-05-13 | Disposition: A | Discharge status: Home / Self Care | Payer: Medicaid Other | Attending: Emergency Medicine | Admitting: Emergency Medicine

## 2022-05-13 DIAGNOSIS — R42 Dizziness and giddiness: Secondary | ICD-10-CM | POA: Insufficient documentation

## 2022-05-13 DIAGNOSIS — H81399 Other peripheral vertigo, unspecified ear: Secondary | ICD-10-CM

## 2022-05-13 DIAGNOSIS — R110 Nausea: Secondary | ICD-10-CM | POA: Diagnosis not present

## 2022-05-13 DIAGNOSIS — R9431 Abnormal electrocardiogram [ECG] [EKG]: Secondary | ICD-10-CM | POA: Diagnosis not present

## 2022-05-13 LAB — BASIC METABOLIC PANEL
Anion gap: 7 (ref 5–15)
BUN: 9 mg/dL (ref 6–20)
CO2: 24 mmol/L (ref 22–32)
Calcium: 9.5 mg/dL (ref 8.9–10.3)
Chloride: 109 mmol/L (ref 98–111)
Creatinine, Ser: 0.5 mg/dL (ref 0.44–1.00)
GFR, Estimated: >60 mL/min (ref >60)
Glucose, Bld: 96 mg/dL (ref 70–99)
Potassium: 3.8 mmol/L (ref 3.5–5.1)
Sodium: 140 mmol/L (ref 135–145)

## 2022-05-13 LAB — URINALYSIS, ROUTINE W REFLEX MICROSCOPIC
Bilirubin Urine: NEGATIVE
Glucose, UA: NEGATIVE mg/dL
Hgb urine dipstick: NEGATIVE
Ketones, ur: NEGATIVE mg/dL
Leukocytes,Ua: NEGATIVE
Nitrite: NEGATIVE
Protein, ur: NEGATIVE mg/dL
Specific Gravity, Urine: 1.008 (ref 1.005–1.030)
pH: 6 (ref 5.0–8.0)

## 2022-05-13 LAB — CBC
HCT: 43.4 % (ref 36.0–46.0)
Hemoglobin: 15.2 g/dL — ABNORMAL HIGH (ref 12.0–15.0)
MCH: 29.5 pg (ref 26.0–34.0)
MCHC: 35 g/dL (ref 30.0–36.0)
MCV: 84.3 fL (ref 80.0–100.0)
Platelets: 257 10*3/uL (ref 150–400)
RBC: 5.15 MIL/uL — ABNORMAL HIGH (ref 3.87–5.11)
RDW: 12.3 % (ref 11.5–15.5)
WBC: 6.6 10*3/uL (ref 4.0–10.5)
nRBC: 0 % (ref 0.0–0.2)

## 2022-05-13 LAB — POC URINE PREG, ED: Preg Test, Ur: NEGATIVE

## 2022-05-13 LAB — CBG MONITORING, ED: Glucose-Capillary: 91 mg/dL (ref 70–99)

## 2022-05-13 MED ORDER — MECLIZINE HCL 25 MG PO TABS: 25.0000 mg | ORAL_TABLET | Freq: Three times a day (TID) | ORAL | 0 refills | Status: DC | PRN

## 2022-05-13 MED ORDER — DIAZEPAM 2 MG PO TABS
2.0000 mg | ORAL_TABLET | Freq: Once | ORAL | Status: AC
  Administered 2022-05-13: 2 mg via ORAL
  Filled 2022-05-13: qty 1

## 2022-05-13 MED ORDER — DIAZEPAM 2 MG PO TABS: 2.0000 mg | ORAL_TABLET | Freq: Two times a day (BID) | ORAL | 0 refills | Status: DC | PRN

## 2022-05-13 MED ORDER — MECLIZINE HCL 12.5 MG PO TABS
25.0000 mg | ORAL_TABLET | Freq: Once | ORAL | Status: AC
  Administered 2022-05-13: 25 mg via ORAL
  Filled 2022-05-13: qty 2

## 2022-05-13 NOTE — Discharge Instructions (Addendum)
Use the medications as prescribed as needed for symptom relief.  Do not drive while taking these medicines, especially Valium as they can make you drowsy, although if you are continuing to have dizziness you should not be driving regardless.  Refer to the Epley maneuver information, these positional changes can help symptoms resolve quicker.

## 2022-05-13 NOTE — ED Triage Notes (Signed)
Patient c/o dizziness that started x2 days ago and is progressively getting worse. Per patient started when getting out of bed. Patient states dizziness is worse with movement, "feels like room is spinning. Patient reports nausea without vomiting, diarrhea, or fevers. Patient menstruation is late by 5 days. Denies any headache or neurological deficit. Generalized weakness also reported.

## 2022-05-13 NOTE — ED Provider Notes (Signed)
[facility] EMERGENCY DEPARTMENT Provider Note   CSN: [id number] Arrival date & time: 05/13/22  1407     History  Chief Complaint  Patient presents with   Dizziness    Julie Case is a 31 y.o. female with a history including epilepsy and depression, stating she has not been on seizure medications in approximately 6 years but also reports her last seizure episode occurred about 4 years ago, presenting for dizziness which started 2 days ago.  She describes vertiginous room spinning quality dizziness which is worsened with positional changes and resolves at rest.  She has had some nausea without emesis with these episodes.  She does have a history of similar symptoms in the past with her last pregnancy, and endorses she is currently 5 days late with her period, could possibly be pregnant.  She has had no fevers or chills, she denies headache, vision changes, ear pain, tinnitus, focal weakness.  She has had no treatment for symptoms prior to arrival.  The history is provided by the patient.       Home Medications Prior to Admission medications   Medication Sig Start Date End Date Taking? Authorizing Provider  diazepam (VALIUM) 2 MG tablet Take 1 tablet (2 mg total) by mouth every 12 (twelve) hours as needed (dizziness). 05/13/22  Yes Jakory Matsuo, Raynelle Fanning, PA-C  meclizine (ANTIVERT) 25 MG tablet Take 1 tablet (25 mg total) by mouth 3 (three) times daily as needed for dizziness. 05/13/22  Yes Jenisse Vullo, Raynelle Fanning, PA-C  acetaminophen (TYLENOL) 500 MG tablet Take 500 mg by mouth every 6 (six) hours as needed for mild pain.    [provider]  Chlorphen-PE-Acetaminophen (NOREL AD) 4-10-325 MG TABS Take One(1) tablet every 4 hrs: do not take more than 6 tablet in 24hrs 04/11/22   Gilmore Laroche, FNP  famotidine (PEPCID) 20 MG tablet Take 20 mg by mouth daily as needed for heartburn or indigestion.    [provider]  ibuprofen (ADVIL) 600 MG tablet Take 1 tablet (600 mg total) by mouth every 6  (six) hours as needed. 03/18/22   Honor Loh M, PA-C  norethindrone (MICRONOR) 0.35 MG tablet Take 1 tablet (0.35 mg total) by mouth daily. 02/12/22 05/13/22  Myna Hidalgo, DO  PARoxetine (PAXIL) 30 MG tablet Take 1 tablet (30 mg total) by mouth daily. 04/11/22   Gilmore Laroche, FNP      Allergies    Penicillins and Naproxen    Review of Systems   Review of Systems  Constitutional:  Negative for chills and fever.  HENT:  Negative for congestion, ear pain, sore throat and tinnitus.   Eyes: Negative.  Negative for photophobia and visual disturbance.  Respiratory:  Negative for chest tightness and shortness of breath.   Cardiovascular:  Negative for chest pain.  Gastrointestinal:  Positive for nausea. Negative for abdominal pain and vomiting.  Genitourinary: Negative.   Musculoskeletal:  Negative for arthralgias, joint swelling and neck pain.  Skin: Negative.  Negative for rash and wound.  Neurological:  Positive for dizziness. Negative for seizures, syncope, weakness, light-headedness, numbness and headaches.  Psychiatric/Behavioral: Negative.    All other systems reviewed and are negative.   Physical Exam Updated Vital Signs BP 113/85   Pulse 71   Temp 97.8 F (36.6 C) (Oral)   Resp 15   Ht 5\' 5"  (1.651 m)   Wt 90.7 kg   LMP 04/14/2022 (Approximate)   SpO2 99%   BMI 33.28 kg/m  Physical Exam Vitals and nursing note reviewed.  Constitutional:      Appearance: She is well-developed.  HENT:     Head: Normocephalic and atraumatic.     Right Ear: Tympanic membrane normal.     Left Ear: Tympanic membrane normal.  Eyes:     General: No visual field deficit.    Extraocular Movements: Extraocular movements intact.     Pupils: Pupils are equal, round, and reactive to light.  Cardiovascular:     Rate and Rhythm: Normal rate.     Heart sounds: Normal heart sounds.  Pulmonary:     Effort: Pulmonary effort is normal.  Abdominal:     Palpations: Abdomen is soft.      Tenderness: There is no abdominal tenderness.  Musculoskeletal:        General: Normal range of motion.     Cervical back: Normal range of motion and neck supple.  Lymphadenopathy:     Cervical: No cervical adenopathy.  Skin:    General: Skin is warm and dry.     Findings: No rash.  Neurological:     General: No focal deficit present.     Mental Status: She is alert and oriented to person, place, and time.     GCS: GCS eye subscore is 4. GCS verbal subscore is 5. GCS motor subscore is 6.     Cranial Nerves: No cranial nerve deficit.     Sensory: No sensory deficit.     Coordination: Coordination normal. Heel to [facility] Test normal. Rapid alternating movements normal.     Gait: Gait normal.     Deep Tendon Reflexes: Reflexes normal.     Comments: Normal heel-shin, normal rapid alternating movements. Cranial nerves III-XII intact.  No pronator drift.  Psychiatric:        Speech: Speech normal.        Behavior: Behavior normal.        Thought Content: Thought content normal.     ED Results / Procedures / Treatments   Labs (all labs ordered are listed, but only abnormal results are displayed) Labs Reviewed  CBC - Abnormal; Notable for the following components:      Result Value   RBC 5.15 (*)    Hemoglobin 15.2 (*)    All other components within normal limits  URINALYSIS, ROUTINE W REFLEX MICROSCOPIC - Abnormal; Notable for the following components:   APPearance HAZY (*)    All other components within normal limits  BASIC METABOLIC PANEL  CBG MONITORING, ED  POC URINE PREG, ED    EKG EKG Interpretation  Date/Time:  Sunday May 13 2022 14:32:01 EDT Ventricular Rate:  80 PR Interval:  134 QRS Duration: 91 QT Interval:  379 QTC Calculation: 438 R Axis:   46 Text Interpretation: Normal sinus rhythm Normal ECG No old tracing to compare Confirmed by Noemi Chapel [id number]) on 05/13/2022 3:38:38 PM  Radiology No results found.  Procedures Procedures    Medications  Ordered in ED Medications  meclizine (ANTIVERT) tablet 25 mg (25 mg Oral Given 05/13/22 1547)  diazepam (VALIUM) tablet 2 mg (2 mg Oral Given 05/13/22 1547)    ED Course/ Medical Decision Making/ A&P                           Medical Decision Making Patient with what sounds like classic peripheral vertigo with room spinning with positional changes.  She has no focal neurologic deficits.  Basic labs completed and no electrolyte abnormalities, patient is not pregnant.  She is not anemic.  She was given Antivert and Valium and her symptoms completely resolved.  She is given a short course of these medications, we discussed home treatment as well including Epley maneuvers which were provided to her.  She was advised to follow-up with her primary provider for recheck in 1 week if her symptoms are not resolved.  Amount and/or Complexity of Data Reviewed Labs: ordered.  Risk Prescription drug management.           Final Clinical Impression(s) / ED Diagnoses Final diagnoses:  Peripheral vertigo, unspecified laterality    Rx / DC Orders ED Discharge Orders          Ordered    diazepam (VALIUM) 2 MG tablet  Every 12 hours PRN        05/13/22 1720    meclizine (ANTIVERT) 25 MG tablet  3 times daily PRN        05/13/22 1720              Burgess Amor, PA-C 05/13/22 1722    Eber Hong, MD 05/15/22 1230

## 2022-06-14 ENCOUNTER — Ambulatory Visit (INDEPENDENT_AMBULATORY_CARE_PROVIDER_SITE_OTHER): Payer: Medicaid Other | Admitting: Family Medicine

## 2022-06-14 ENCOUNTER — Encounter: Payer: Self-pay | Admitting: Family Medicine

## 2022-06-14 ENCOUNTER — Telehealth: Payer: Self-pay | Admitting: *Deleted

## 2022-06-14 VITALS — BP 132/85 | HR 98 | Ht 65.0 in | Wt 224.1 lb

## 2022-06-14 DIAGNOSIS — Z3200 Encounter for pregnancy test, result unknown: Secondary | ICD-10-CM

## 2022-06-14 DIAGNOSIS — Z3A01 Less than 8 weeks gestation of pregnancy: Secondary | ICD-10-CM | POA: Diagnosis not present

## 2022-06-14 DIAGNOSIS — O219 Vomiting of pregnancy, unspecified: Secondary | ICD-10-CM | POA: Diagnosis not present

## 2022-06-14 LAB — POCT URINE PREGNANCY: Preg Test, Ur: POSITIVE — AB

## 2022-06-14 MED ORDER — VITAMIN B-6 25 MG PO TABS: 25.0000 mg | ORAL_TABLET | Freq: Four times a day (QID) | ORAL | 0 refills | Status: DC | PRN

## 2022-06-14 NOTE — Assessment & Plan Note (Signed)
Positive pregnancy test  Informed the patient that dizziness in pregnancy is due to increase hormones that causes your blood vessels to widen and relax.  This helps increase the blood flow to your baby but is also return of blood in the veins to you.  To avoid dizziness I recommend: Eat regular meals Change positions slowly Drink at least 64 ounces of water daily Stand up slowly from seated or lying positions Slow down, bend over, and breathe deeply or lie on your side when you feel dizzy.  This increase blood flow to your brain Encouraged to follow-up with GYN

## 2022-06-14 NOTE — Telephone Encounter (Signed)
Pt was seen by family dr today for dizziness. Pt had a + UPT while there. Has a history of miscarriage and ectopic pregnancy. Pt is having pain in left side. I spoke with FCD, CNM. Pt was advised would need to go to [facility] for ectopic pregnancy workup. Pt voiced understanding. JSY

## 2022-06-14 NOTE — Patient Instructions (Addendum)
I appreciate the opportunity to provide care to you today!    Follow up: 07/13/2022  Dizziness in pregnancy is due to increase hormones that causes  your blood vessels to widen and relax.  This helps increase the blood flow to your baby but is also return of blood in the veins to you.  To avoid dizziness I recommend: Eat regular meals Change positions slowly Drink at least 64 ounces of water daily Stand up slowly from seated or lying positions Slow down, bend over, and breathe deeply or lie on your side when you feel dizzy.  This increase blood flow to your brain  Please follow-up with GYN for prenatal care for your pregnancy  Please start taking Paxil and ibuprofen during her pregnancy    Please continue to a heart-healthy diet and increase your physical activities. Try to exercise for at least three times a week.      It was a pleasure to see you and I look forward to continuing to work together on your health and well-being. Please do not hesitate to call the office if you need care or have questions about your care.   Have a wonderful day and week. With Gratitude, Gilmore Laroche MSN, FNP-BC

## 2022-06-14 NOTE — Progress Notes (Signed)
Established Patient Office Visit  Subjective:  Patient ID: Julie Case, female    DOB: 05/18/1991  Age: 31 y.o. MRN: [id number]  CC:  Chief Complaint  Patient presents with   Dizziness    Pt reports dizziness that has been going on for several weeks, had an ED visit on 05/13/2022 for the same reason, still not feeling any better, also having shakiness. States she is late on her menstrual cycle.     HPI Julie Case is a 31 y.o. female with past medical history of Acute URI, Diarrhea presents for f/u of  chronic medical conditions.  Dizziness: She was seen in the ED on 05/13/2022 with complaints of dizziness, which worsened with position changes and resolved at rest. She reports having similar symptoms when she was pregnant.  She had a negative ectopic pregnancy in 2022 and a miscarriage in June 2023.  She reports that she is late on her menstrual cycle.   Past Medical History:  Diagnosis Date   Depression    Epilepsy (HCC)     Past Surgical History:  Procedure Laterality Date   CHOLECYSTECTOMY     ECTOPIC PREGNANCY SURGERY     with right tube removal    Family History  Problem Relation Age of Onset   Hypertension Other    Diabetes Other     Social History   Socioeconomic History   Marital status: Single    Spouse name: Not on file   Number of children: Not on file   Years of education: Not on file   Highest education level: Not on file  Occupational History   Not on file  Tobacco Use   Smoking status: Every Day    Types: Cigarettes   Smokeless tobacco: Never  Vaping Use   Vaping Use: Never used  Substance and Sexual Activity   Alcohol use: Never   Drug use: Never   Sexual activity: Yes    Birth control/protection: None  Other Topics Concern   Not on file  Social History Narrative   Not on file   Social Determinants of Health   Financial Resource Strain: Not on file  Food Insecurity: Not on file  Transportation Needs: Not on file  Physical Activity:  Not on file  Stress: Not on file  Social Connections: Not on file  Intimate Partner Violence: Not on file    Outpatient Medications Prior to Visit  Medication Sig Dispense Refill   acetaminophen (TYLENOL) 500 MG tablet Take 500 mg by mouth every 6 (six) hours as needed for mild pain.     famotidine (PEPCID) 20 MG tablet Take 20 mg by mouth daily as needed for heartburn or indigestion.     ibuprofen (ADVIL) 600 MG tablet Take 1 tablet (600 mg total) by mouth every 6 (six) hours as needed. 30 tablet 0   PARoxetine (PAXIL) 30 MG tablet Take 1 tablet (30 mg total) by mouth daily. 30 tablet 1   Chlorphen-PE-Acetaminophen (NOREL AD) 4-10-325 MG TABS Take One(1) tablet every 4 hrs: do not take more than 6 tablet in 24hrs 30 tablet 0   diazepam (VALIUM) 2 MG tablet Take 1 tablet (2 mg total) by mouth every 12 (twelve) hours as needed (dizziness). 14 tablet 0   meclizine (ANTIVERT) 25 MG tablet Take 1 tablet (25 mg total) by mouth 3 (three) times daily as needed for dizziness. 21 tablet 0   norethindrone (MICRONOR) 0.35 MG tablet Take 1 tablet (0.35 mg total) by mouth daily. 90 tablet  4   No facility-administered medications prior to visit.    Allergies  Allergen Reactions   Penicillins Shortness Of Breath and Rash   Naproxen Nausea And Vomiting    ROS Review of Systems  Constitutional:  Negative for fatigue and fever.  Eyes:  Negative for visual disturbance.  Respiratory:  Negative for chest tightness and shortness of breath.   Cardiovascular:  Negative for chest pain and palpitations.  Gastrointestinal:  Positive for nausea.  Genitourinary:  Negative for frequency and urgency.  Neurological:  Positive for dizziness. Negative for headaches.      Objective:    Physical Exam HENT:     Head: Normocephalic.     Nose: No congestion.  Cardiovascular:     Rate and Rhythm: Normal rate and regular rhythm.     Pulses: Normal pulses.     Heart sounds: Normal heart sounds.  Pulmonary:      Effort: Pulmonary effort is normal.     Breath sounds: Normal breath sounds. No wheezing.  Musculoskeletal:     Cervical back: No rigidity.  Neurological:     Mental Status: She is alert.     BP 132/85   Pulse 98   Ht 5\' 5"  (1.651 m)   Wt 224 lb 1.9 oz (101.7 kg)   LMP 05/10/2022 (Approximate)   SpO2 98%   BMI 37.30 kg/m  Wt Readings from Last 3 Encounters:  06/14/22 224 lb 1.9 oz (101.7 kg)  05/13/22 200 lb (90.7 kg)  05/09/22 218 lb 1.3 oz (98.9 kg)    No results found for: "TSH" Lab Results  Component Value Date   WBC 6.6 05/13/2022   HGB 15.2 (H) 05/13/2022   HCT 43.4 05/13/2022   MCV 84.3 05/13/2022   PLT 257 05/13/2022   Lab Results  Component Value Date   NA 140 05/13/2022   K 3.8 05/13/2022   CO2 24 05/13/2022   GLUCOSE 96 05/13/2022   BUN 9 05/13/2022   CREATININE 0.50 05/13/2022   BILITOT 0.5 01/19/2022   ALKPHOS 80 01/19/2022   AST 13 (L) 01/19/2022   ALT 17 01/19/2022   PROT 7.3 01/19/2022   ALBUMIN 4.1 01/19/2022   CALCIUM 9.5 05/13/2022   ANIONGAP 7 05/13/2022   No results found for: "CHOL" No results found for: "HDL" No results found for: "LDLCALC" No results found for: "TRIG" No results found for: "CHOLHDL" No results found for: "HGBA1C"    Assessment & Plan:  Less than [redacted] weeks gestation of pregnancy Assessment & Plan: Positive pregnancy test  Informed the patient that dizziness in pregnancy is due to increase hormones that causes your blood vessels to widen and relax.  This helps increase the blood flow to your baby but is also return of blood in the veins to you.  To avoid dizziness I recommend: Eat regular meals Change positions slowly Drink at least 64 ounces of water daily Stand up slowly from seated or lying positions Slow down, bend over, and breathe deeply or lie on your side when you feel dizzy.  This increase blood flow to your brain Encouraged to follow-up with GYN   Nausea and vomiting in pregnancy Assessment &  Plan: We will start patient on pyridoxine 25 mg by mouth every 6 hours as needed for nausea Inform the patient to stop taking Paxil and Motrin while pregnant  Orders: -     Vitamin B-6; Take 1 tablet (25 mg total) by mouth every 6 (six) hours as needed.  Dispense: 30  tablet; Refill: 0  Possible pregnancy, not confirmed -     POCT urine pregnancy    Follow-up: Return in about 29 days (around 07/13/2022).   Gilmore Laroche, FNP

## 2022-06-14 NOTE — Assessment & Plan Note (Signed)
We will start patient on pyridoxine 25 mg by mouth every 6 hours as needed for nausea Inform the patient to stop taking Paxil and Motrin while pregnant

## 2022-06-20 ENCOUNTER — Inpatient Hospital Stay (HOSPITAL_COMMUNITY): Payer: Medicaid Other

## 2022-06-20 ENCOUNTER — Inpatient Hospital Stay (HOSPITAL_COMMUNITY)
Admission: AD | Admit: 2022-06-20 | Discharge: 2022-06-20 | Disposition: A | Discharge status: Home / Self Care | Payer: Medicaid Other | Attending: Obstetrics and Gynecology | Admitting: Obstetrics and Gynecology

## 2022-06-20 ENCOUNTER — Encounter (HOSPITAL_COMMUNITY): Payer: Self-pay

## 2022-06-20 ENCOUNTER — Other Ambulatory Visit: Payer: Self-pay

## 2022-06-20 DIAGNOSIS — O3680X0 Pregnancy with inconclusive fetal viability, not applicable or unspecified: Secondary | ICD-10-CM | POA: Insufficient documentation

## 2022-06-20 DIAGNOSIS — R102 Pelvic and perineal pain: Secondary | ICD-10-CM | POA: Insufficient documentation

## 2022-06-20 DIAGNOSIS — O26891 Other specified pregnancy related conditions, first trimester: Secondary | ICD-10-CM | POA: Diagnosis not present

## 2022-06-20 DIAGNOSIS — O09291 Supervision of pregnancy with other poor reproductive or obstetric history, first trimester: Secondary | ICD-10-CM | POA: Insufficient documentation

## 2022-06-20 DIAGNOSIS — Z3201 Encounter for pregnancy test, result positive: Secondary | ICD-10-CM | POA: Diagnosis not present

## 2022-06-20 DIAGNOSIS — R109 Unspecified abdominal pain: Secondary | ICD-10-CM | POA: Diagnosis not present

## 2022-06-20 DIAGNOSIS — Z3A01 Less than 8 weeks gestation of pregnancy: Secondary | ICD-10-CM | POA: Insufficient documentation

## 2022-06-20 DIAGNOSIS — N8311 Corpus luteum cyst of right ovary: Secondary | ICD-10-CM | POA: Diagnosis not present

## 2022-06-20 DIAGNOSIS — Z8759 Personal history of other complications of pregnancy, childbirth and the puerperium: Secondary | ICD-10-CM | POA: Diagnosis not present

## 2022-06-20 LAB — URINALYSIS, ROUTINE W REFLEX MICROSCOPIC
Bilirubin Urine: NEGATIVE
Glucose, UA: NEGATIVE mg/dL
Hgb urine dipstick: NEGATIVE
Ketones, ur: NEGATIVE mg/dL
Leukocytes,Ua: NEGATIVE
Nitrite: NEGATIVE
Protein, ur: NEGATIVE mg/dL
Specific Gravity, Urine: 1.015 (ref 1.005–1.030)
pH: 5 (ref 5.0–8.0)

## 2022-06-20 LAB — CBC
HCT: 42.4 % (ref 36.0–46.0)
Hemoglobin: 15.1 g/dL — ABNORMAL HIGH (ref 12.0–15.0)
MCH: 30.1 pg (ref 26.0–34.0)
MCHC: 35.6 g/dL (ref 30.0–36.0)
MCV: 84.6 fL (ref 80.0–100.0)
Platelets: 277 10*3/uL (ref 150–400)
RBC: 5.01 MIL/uL (ref 3.87–5.11)
RDW: 12.1 % (ref 11.5–15.5)
WBC: 7.3 10*3/uL (ref 4.0–10.5)
nRBC: 0 % (ref 0.0–0.2)

## 2022-06-20 LAB — ABO/RH: ABO/RH(D): O POS

## 2022-06-20 LAB — WET PREP, GENITAL
Clue Cells Wet Prep HPF POC: NONE SEEN
Sperm: NONE SEEN
Trich, Wet Prep: NONE SEEN
WBC, Wet Prep HPF POC: <10 (ref <10)
Yeast Wet Prep HPF POC: NONE SEEN

## 2022-06-20 LAB — HCG, QUANTITATIVE, PREGNANCY: hCG, Beta Chain, Quant, S: 506 m[IU]/mL — ABNORMAL HIGH (ref <5)

## 2022-06-20 NOTE — Discharge Instructions (Signed)
Prenatal Care Providers           [facility]  [address] [phone]

## 2022-06-20 NOTE — MAU Provider Note (Signed)
History     CSN: [id number]  Arrival date and time: 06/20/22 1015   Event Date/Time   First Provider Initiated Contact with Patient 06/20/22 1030      Chief Complaint  Patient presents with   Pregnancy   Julie Case , a  31 y.o. [id number] at [redacted]w[redacted]d presents to MAU with complaints of left sided pelvic pain. Patient states she had been cramping on the left side for a few weeks however pain stopped 2 days ago. Patient was seen at a family medicine doctor and found to have a positive pregnancy test on 11/19. Due to her history and current pelvic pain she was recommended to present to MAU for a ectopic r/o. Patient denies vaginal bleeding or passing clots.           OB History     Gravida  8   Para  5   Term  5   Preterm  0   AB  2   Living  0      SAB  1   IAB      Ectopic  1   Multiple      Live Births  5           Past Medical History:  Diagnosis Date   Depression    Epilepsy ([place])     Past Surgical History:  Procedure Laterality Date   CHOLECYSTECTOMY     ECTOPIC PREGNANCY SURGERY     with right tube removal    Family History  Problem Relation Age of Onset   Hypertension Other    Diabetes Other     Social History   Tobacco Use   Smoking status: Every Day    Types: Cigarettes   Smokeless tobacco: Never  Vaping Use   Vaping Use: Never used  Substance Use Topics   Alcohol use: Never   Drug use: Never    Allergies:  Allergies  Allergen Reactions   Penicillins Shortness Of Breath and Rash   Naproxen Nausea And Vomiting    Medications Prior to Admission  Medication Sig Dispense Refill Last Dose   acetaminophen (TYLENOL) 500 MG tablet Take 500 mg by mouth every 6 (six) hours as needed for mild pain.      famotidine (PEPCID) 20 MG tablet Take 20 mg by mouth daily as needed for heartburn or indigestion.      ibuprofen (ADVIL) 600 MG tablet Take 1 tablet (600 mg total) by mouth every 6 (six) hours as needed. 30 tablet 0     norethindrone (MICRONOR) 0.35 MG tablet Take 1 tablet (0.35 mg total) by mouth daily. 90 tablet 4    PARoxetine (PAXIL) 30 MG tablet Take 1 tablet (30 mg total) by mouth daily. 30 tablet 1    pyridOXINE (VITAMIN B6) 25 MG tablet Take 1 tablet (25 mg total) by mouth every 6 (six) hours as needed. 30 tablet 0     Review of Systems  Constitutional:  Negative for chills, fatigue and fever.  Eyes:  Negative for pain and visual disturbance.  Respiratory:  Negative for apnea, shortness of breath and wheezing.   Cardiovascular:  Negative for chest pain and palpitations.  Gastrointestinal:  Negative for abdominal pain, constipation, diarrhea, nausea and vomiting.  Genitourinary:  Positive for pelvic pain. Negative for difficulty urinating, dysuria, vaginal bleeding, vaginal discharge and vaginal pain.  Musculoskeletal:  Negative for back pain.  Neurological:  Negative for seizures, weakness and headaches.  Psychiatric/Behavioral:  Negative for suicidal ideas.  Physical Exam   Blood pressure (!) 109/52, pulse 97, temperature 98.1 F (36.7 C), temperature source Oral, resp. rate 16, height 5\' 5"  (1.651 m), weight 101.9 kg, last menstrual period 05/10/2022, SpO2 98 %.  Physical Exam Vitals and nursing note reviewed.  Constitutional:      General: She is not in acute distress.    Appearance: Normal appearance.  HENT:     Head: Normocephalic.  Pulmonary:     Effort: Pulmonary effort is normal.  Musculoskeletal:     Cervical back: Normal range of motion.  Skin:    General: Skin is warm and dry.     Capillary Refill: Capillary refill takes less than 2 seconds.  Neurological:     Mental Status: She is alert and oriented to person, place, and time.  Psychiatric:        Mood and Affect: Mood normal.     MAU Course  Procedures Orders Placed This Encounter  Procedures   Wet prep, genital   07/10/2022 OB LESS THAN 14 WEEKS WITH OB TRANSVAGINAL   Urinalysis, Routine w reflex microscopic Urine,  Clean Catch   CBC   hCG, quantitative, pregnancy   Diet NPO time specified   ABO/Rh   Discharge patient   Results for orders placed or performed during the hospital encounter of 06/20/22 (from the past 24 hour(s))  Urinalysis, Routine w reflex microscopic Urine, Clean Catch     Status: Abnormal   Collection Time: 06/20/22 10:44 AM  Result Value Ref Range   Color, Urine YELLOW YELLOW   APPearance HAZY (A) CLEAR   Specific Gravity, Urine 1.015 1.005 - 1.030   pH 5.0 5.0 - 8.0   Glucose, UA NEGATIVE NEGATIVE mg/dL   Hgb urine dipstick NEGATIVE NEGATIVE   Bilirubin Urine NEGATIVE NEGATIVE   Ketones, ur NEGATIVE NEGATIVE mg/dL   Protein, ur NEGATIVE NEGATIVE mg/dL   Nitrite NEGATIVE NEGATIVE   Leukocytes,Ua NEGATIVE NEGATIVE  Wet prep, genital     Status: None   Collection Time: 06/20/22 10:44 AM   Specimen: PATH Cytology Cervicovaginal Ancillary Only  Result Value Ref Range   Yeast Wet Prep HPF POC NONE SEEN NONE SEEN   Trich, Wet Prep NONE SEEN NONE SEEN   Clue Cells Wet Prep HPF POC NONE SEEN NONE SEEN   WBC, Wet Prep HPF POC <10 <10   Sperm NONE SEEN   CBC     Status: Abnormal   Collection Time: 06/20/22 10:48 AM  Result Value Ref Range   WBC 7.3 4.0 - 10.5 K/uL   RBC 5.01 3.87 - 5.11 MIL/uL   Hemoglobin 15.1 (H) 12.0 - 15.0 g/dL   HCT 06/22/22 83.3 - 82.5 %   MCV 84.6 80.0 - 100.0 fL   MCH 30.1 26.0 - 34.0 pg   MCHC 35.6 30.0 - 36.0 g/dL   RDW 05.3 97.6 - 73.4 %   Platelets 277 150 - 400 K/uL   nRBC 0.0 0.0 - 0.2 %  ABO/Rh     Status: None   Collection Time: 06/20/22 10:48 AM  Result Value Ref Range   ABO/RH(D) O POS    No rh immune globuloin      NOT A RH IMMUNE GLOBULIN CANDIDATE, PT RH POSITIVE Performed at [facility], [address], [place], [place]   hCG, quantitative, pregnancy     Status: Abnormal   Collection Time: 06/20/22 10:48 AM  Result Value Ref Range   hCG, Beta Chain, Quant, S 506 (H) <  5 mIU/mL   US OB LESS THAN 14 WEEKS WITH OB  TRANSVAGINAL  Result Date: 06/20/2022 CLINICAL DATA:  Abdominal cramping.  Positive pregnancy test. EXAM: OBSTETRIC <14 WK [place] AND TRANSVAGINAL OB US TECHNIQUE: Both transabdominal and transvaginal ultrasound examinations were performed for complete evaluation of the gestation as well as the maternal uterus, adnexal regions, and pelvic cul-de-sac. Transvaginal technique was performed to assess early pregnancy. COMPARISON:  None Available. FINDINGS: Intrauterine gestational sac: Single Yolk sac:  Present Embryo:  None Cardiac Activity: N/A Heart Rate: N/A bpm MSD: 14.9 mm   6 w   2 d Subchorionic hemorrhage:  None visualized. Maternal uterus/adnexae: Corpus luteum cyst right ovary. Left ovary not visualized. IMPRESSION: Probable early intrauterine gestational sac, but no fetal pole, or cardiac activity yet visualized. Recommend follow-up quantitative B-HCG levels and follow-up US in 14 days to assess viability. This recommendation follows SRU consensus guidelines: Diagnostic Criteria for Nonviable Pregnancy Early in the First Trimester. [employer] Med [id number]. Electronically Signed   By: Marijo Sanes M.D.   On: 06/20/2022 11:30    Lab  results reviewed and interpreted by me.   MDM - UA hazy but otherwise normal  - Wet prep normal  - CBC normal  - O Pos no Rhogam needed.  - US revealed a [place] and YS. Low suspicion for ectopic pregnancy  - HCG levels low at 506. Consulted Dr. Elly Modena MAU attending based on patient presentation for absence of bleeding with both a GS and YS and low quant. Per MD, schedule patient for repeat Quant on Monday.  - Plan for discharge   Assessment and Plan   1. Pregnancy with inconclusive fetal viability, single or unspecified fetus   2. [redacted] weeks gestation of pregnancy   3. History of ectopic pregnancy    - Discussed that based on Quant & [place] this pregnancy is inconclusive for fetal viability.  - Per Recommendation patient scheduled for repeat Quant for Monday at  [facility].  - Worsening signs and return precautions reviewed with patient.  - Patient discharged home in stable condition and may return to MAU as needed.   Jacquiline Doe, MSN CNM  06/20/2022, 1:10 PM

## 2022-06-20 NOTE — MAU Note (Signed)
Julie Case is a 31 y.o. at [redacted]w[redacted]d here in MAU reporting: hx of ectopic pregnancy. Was advised by OB to come in for evaluation. Was having left sided abdominal on 11/16 but has had no pain for the last 2 days. No bleeding.  LMP: 05/10/2022  Onset of complaint: ongoing  Pain score: 0/10  Vitals:   06/20/22 1026  BP: (!) 109/52  Pulse: 97  Resp: 16  Temp: 98.1 F (36.7 C)  SpO2: 98%     FHT:NA  Lab orders placed from triage: none

## 2022-06-21 LAB — GC/CHLAMYDIA PROBE AMP (~~LOC~~) NOT AT ARMC
Chlamydia: NEGATIVE
Comment: NEGATIVE
Comment: NORMAL
Neisseria Gonorrhea: NEGATIVE

## 2022-06-25 ENCOUNTER — Inpatient Hospital Stay (HOSPITAL_COMMUNITY): Payer: Medicaid Other

## 2022-06-25 ENCOUNTER — Other Ambulatory Visit: Payer: Medicaid Other

## 2022-06-25 ENCOUNTER — Encounter (HOSPITAL_COMMUNITY): Payer: Self-pay | Admitting: Family Medicine

## 2022-06-25 ENCOUNTER — Inpatient Hospital Stay (HOSPITAL_COMMUNITY)
Admission: AD | Admit: 2022-06-25 | Discharge: 2022-06-25 | Disposition: A | Discharge status: Home / Self Care | Payer: Medicaid Other | Attending: Family Medicine | Admitting: Family Medicine

## 2022-06-25 DIAGNOSIS — O99341 Other mental disorders complicating pregnancy, first trimester: Secondary | ICD-10-CM | POA: Insufficient documentation

## 2022-06-25 DIAGNOSIS — N858 Other specified noninflammatory disorders of uterus: Secondary | ICD-10-CM | POA: Diagnosis not present

## 2022-06-25 DIAGNOSIS — O26891 Other specified pregnancy related conditions, first trimester: Secondary | ICD-10-CM | POA: Diagnosis not present

## 2022-06-25 DIAGNOSIS — Z3A01 Less than 8 weeks gestation of pregnancy: Secondary | ICD-10-CM | POA: Diagnosis not present

## 2022-06-25 DIAGNOSIS — O09291 Supervision of pregnancy with other poor reproductive or obstetric history, first trimester: Secondary | ICD-10-CM | POA: Insufficient documentation

## 2022-06-25 DIAGNOSIS — O021 Missed abortion: Secondary | ICD-10-CM | POA: Insufficient documentation

## 2022-06-25 DIAGNOSIS — O99351 Diseases of the nervous system complicating pregnancy, first trimester: Secondary | ICD-10-CM | POA: Diagnosis not present

## 2022-06-25 LAB — CBC
HCT: 40.5 % (ref 36.0–46.0)
Hemoglobin: 14 g/dL (ref 12.0–15.0)
MCH: 30 pg (ref 26.0–34.0)
MCHC: 34.6 g/dL (ref 30.0–36.0)
MCV: 86.7 fL (ref 80.0–100.0)
Platelets: 219 10*3/uL (ref 150–400)
RBC: 4.67 MIL/uL (ref 3.87–5.11)
RDW: 12.1 % (ref 11.5–15.5)
WBC: 7.7 10*3/uL (ref 4.0–10.5)
nRBC: 0 % (ref 0.0–0.2)

## 2022-06-25 LAB — URINALYSIS, ROUTINE W REFLEX MICROSCOPIC
Bilirubin Urine: NEGATIVE
Glucose, UA: NEGATIVE mg/dL
Ketones, ur: NEGATIVE mg/dL
Leukocytes,Ua: NEGATIVE
Nitrite: NEGATIVE
Protein, ur: NEGATIVE mg/dL
Specific Gravity, Urine: 1.019 (ref 1.005–1.030)
pH: 5 (ref 5.0–8.0)

## 2022-06-25 LAB — HCG, QUANTITATIVE, PREGNANCY: hCG, Beta Chain, Quant, S: 433 m[IU]/mL — ABNORMAL HIGH (ref <5)

## 2022-06-25 MED ORDER — OXYCODONE-ACETAMINOPHEN 5-325 MG PO TABS: 1.0000 | ORAL_TABLET | Freq: Four times a day (QID) | ORAL | 0 refills | Status: DC | PRN

## 2022-06-25 MED ORDER — MISOPROSTOL 200 MCG PO TABS: ORAL_TABLET | ORAL | 1 refills | Status: DC

## 2022-06-25 NOTE — MAU Provider Note (Addendum)
History     CSN: [id number]  Arrival date and time: 06/25/22 4742   Event Date/Time   First Provider Initiated Contact with Patient 06/25/22 [phone]      Chief Complaint  Patient presents with   Abdominal Pain   Vaginal Bleeding   HPI 31 year old G8 P5-0-2-5 at 6 weeks 4 days by LMP who presents with abdominal pain, particularly on the right side.  She has been experiencing intermittent pain since she found out that she was pregnant and has been seen on 1/22 for pain.  Her pain resolved, but then returned this morning.  Pain is sharp and worse in the right lower quadrant.  She does have little bit of spotting.  No nausea, vomiting.  OB History     Gravida  8   Para  5   Term  5   Preterm  0   AB  2   Living  0      SAB  1   IAB      Ectopic  1   Multiple      Live Births  5           Past Medical History:  Diagnosis Date   Depression    Epilepsy ([facility])     Past Surgical History:  Procedure Laterality Date   CHOLECYSTECTOMY     ECTOPIC PREGNANCY SURGERY     with right tube removal    Family History  Problem Relation Age of Onset   Hypertension Other    Diabetes Other     Social History   Tobacco Use   Smoking status: Every Day    Types: Cigarettes   Smokeless tobacco: Never  Vaping Use   Vaping Use: Never used  Substance Use Topics   Alcohol use: Never   Drug use: Never    Allergies:  Allergies  Allergen Reactions   Penicillins Shortness Of Breath and Rash   Naproxen Nausea And Vomiting    Medications Prior to Admission  Medication Sig Dispense Refill Last Dose   famotidine (PEPCID) 20 MG tablet Take 20 mg by mouth daily as needed for heartburn or indigestion.   Past Month   Prenatal Vit-Fe Fumarate-FA (PRENATAL MULTIVITAMIN) TABS tablet Take 1 tablet by mouth daily at 12 noon.   06/24/2022   acetaminophen (TYLENOL) 500 MG tablet Take 500 mg by mouth every 6 (six) hours as needed for mild pain.   Unknown   norethindrone  (MICRONOR) 0.35 MG tablet Take 1 tablet (0.35 mg total) by mouth daily. 90 tablet 4    PARoxetine (PAXIL) 30 MG tablet Take 1 tablet (30 mg total) by mouth daily. 30 tablet 1 Unknown   pyridOXINE (VITAMIN B6) 25 MG tablet Take 1 tablet (25 mg total) by mouth every 6 (six) hours as needed. 30 tablet 0 Unknown    Review of Systems Physical Exam   Blood pressure 118/67, pulse 97, temperature 98 F (36.7 C), temperature source Oral, resp. rate 18, height 5\' 5"  (1.651 m), weight 103.6 kg, last menstrual period 05/10/2022, SpO2 98 %.  Physical Exam HENT:     Head: Normocephalic and atraumatic.  Cardiovascular:     Rate and Rhythm: Normal rate and regular rhythm.  Pulmonary:     Effort: Pulmonary effort is normal. No respiratory distress.     Breath sounds: Normal breath sounds. No wheezing, rhonchi or rales.  Abdominal:     Tenderness: There is abdominal tenderness in the right lower quadrant and left lower quadrant.  There is no guarding or rebound.  Skin:    General: Skin is warm and dry.    Results for orders placed or performed during the hospital encounter of 06/25/22 (from the past 24 hour(s))  hCG, quantitative, pregnancy     Status: Abnormal   Collection Time: 06/25/22  9:27 AM  Result Value Ref Range   hCG, Beta Chain, Quant, S 433 (H) <5 mIU/mL  CBC     Status: None   Collection Time: 06/25/22  9:27 AM  Result Value Ref Range   WBC 7.7 4.0 - 10.5 K/uL   RBC 4.67 3.87 - 5.11 MIL/uL   Hemoglobin 14.0 12.0 - 15.0 g/dL   HCT 69.4 85.4 - 62.7 %   MCV 86.7 80.0 - 100.0 fL   MCH 30.0 26.0 - 34.0 pg   MCHC 34.6 30.0 - 36.0 g/dL   RDW 03.5 00.9 - 38.1 %   Platelets 219 150 - 400 K/uL   nRBC 0.0 0.0 - 0.2 %   US OB Transvaginal  Result Date: 06/25/2022 CLINICAL DATA:  Pelvic pain. Pregnant patient. Patient is 6 weeks and 4 days pregnant based on her last menstrual period. EXAM: TRANSVAGINAL OB ULTRASOUND TECHNIQUE: Transvaginal ultrasound was performed for complete evaluation  of the gestation as well as the maternal uterus, adnexal regions, and pelvic cul-de-sac. COMPARISON:  06/20/2022. FINDINGS: Intrauterine gestational sac: Single Yolk sac:  Not Visualized. Embryo:  Not Visualized. Cardiac Activity: Not applicable MSD: 4.1 mm   5 w   1 d Subchorionic hemorrhage:  None visualized. Maternal uterus/adnexae: No uterine masses. Cervix is closed. Stable right ovary corpus luteum. Ovaries otherwise unremarkable. IMPRESSION: 1. Since the prior exam, the elongated fluid collection/possible gestational sac, noted within the endometrium/endometrial canal has resolved. There is now a small oval cyst in the upper uterine segment that may reflect a gestational sac. There is no visualized embryo or yolk sac. If this is a gestational sac, it is consistent with an early intrauterine pregnancy at 5 weeks and 1 day. Recommend correlation with the patient's beta HCG level and, if rising, follow-up ultrasound in 7-10 days to assess for normal pregnancy progression. Electronically Signed   By: Amie Portland M.D.   On: 06/25/2022 10:03     MAU Course  Procedures  MDM Check CBC, HCG, [place].  I personally reviewed the ultrasound images.  Gestational sac measuring 5 weeks and 1 day.  No yolk sac or fetal pole.  Assessment and Plan   1. Missed abortion   2. [redacted] weeks gestation of pregnancy    In the setting of decreasing hCG and development of the gestational sac, this is indicative of a nonviable pregnancy.  We discussed options with patient including rechecking hormone level in 48 hours, watching to see if her body proceed with the miscarriage on its own, or facilitating passage of the pregnancy tissue with Cytotec.  Patient opted for medical management with Cytotec.  We discussed how to take the medicine, when to return to care.  [facility].  Return to the MAU heavy bleeding, lightheadedness, dizziness, nausea, vomiting.  [name] 06/25/2022,  9:37 AM

## 2022-06-25 NOTE — MAU Note (Addendum)
Julie Case is a 31 y.o. at [redacted]w[redacted]d here in MAU reporting: woke up about an hour ago, had sharp pain in RLQ.  When she went to the bathroom, there was spotting (brown). Denies recent intercourse.  Had diarrhea last night 3 times, no one else at home is sick. Denies fever.  No problems with urination.  Onset of complaint: hour ago Pain score: 7 Vitals:   06/25/22 0851  BP: 127/83  Pulse: 84  Resp: 18  Temp: 98 F (36.7 C)  SpO2: 98%      Lab orders placed from triage:  isolation, UA

## 2022-06-27 ENCOUNTER — Other Ambulatory Visit: Payer: Self-pay | Admitting: Family Medicine

## 2022-07-05 ENCOUNTER — Emergency Department (HOSPITAL_COMMUNITY)
Admission: EM | Admit: 2022-07-05 | Discharge: 2022-07-05 | Disposition: A | Discharge status: Home / Self Care | Payer: Medicaid Other | Attending: Emergency Medicine | Admitting: Emergency Medicine

## 2022-07-05 ENCOUNTER — Other Ambulatory Visit: Payer: Self-pay

## 2022-07-05 ENCOUNTER — Emergency Department (HOSPITAL_COMMUNITY): Payer: Medicaid Other

## 2022-07-05 ENCOUNTER — Encounter (HOSPITAL_COMMUNITY): Payer: Self-pay | Admitting: Emergency Medicine

## 2022-07-05 DIAGNOSIS — M25522 Pain in left elbow: Secondary | ICD-10-CM | POA: Insufficient documentation

## 2022-07-05 DIAGNOSIS — M25512 Pain in left shoulder: Secondary | ICD-10-CM | POA: Diagnosis not present

## 2022-07-05 DIAGNOSIS — M79602 Pain in left arm: Secondary | ICD-10-CM | POA: Diagnosis not present

## 2022-07-05 DIAGNOSIS — X500XXA Overexertion from strenuous movement or load, initial encounter: Secondary | ICD-10-CM | POA: Insufficient documentation

## 2022-07-05 MED ORDER — DEXAMETHASONE SODIUM PHOSPHATE 10 MG/ML IJ SOLN
10.0000 mg | Freq: Once | INTRAMUSCULAR | Status: AC
  Administered 2022-07-05: 10 mg via INTRAMUSCULAR
  Filled 2022-07-05: qty 1

## 2022-07-05 MED ORDER — PREDNISONE 20 MG PO TABS: 40.0000 mg | ORAL_TABLET | Freq: Every day | ORAL | 0 refills | Status: DC

## 2022-07-05 NOTE — ED Triage Notes (Signed)
Pt presents with left shoulder pain radiating down to left elbow, per pt no injury, but does heavy lifting.

## 2022-07-05 NOTE — ED Provider Notes (Signed)
[facility] EMERGENCY DEPARTMENT Provider Note   CSN: [id number] Arrival date & time: 07/05/22  1338     History  Chief Complaint  Patient presents with   Shoulder Pain    Julie Case is a 31 y.o. female.  HPI Patient presents with elbow and distal upper arm pain.  She is here with a female companion assists with history.  Pain has become worse over the past days as she is continue to use her arm, pain is worse with holding the upper arm in abduction, with elbow flexed.  No true wrist pain, no true shoulder pain though she does have pain radiating to the shoulder with motion of the elbow. No skin color changes, no fever.    Home Medications Prior to Admission medications   Medication Sig Start Date End Date Taking? Authorizing Provider  predniSONE (DELTASONE) 20 MG tablet Take 2 tablets (40 mg total) by mouth daily with breakfast. For the next four days 07/05/22  Yes Gerhard Munch, MD  acetaminophen (TYLENOL) 500 MG tablet Take 500 mg by mouth every 6 (six) hours as needed for mild pain.    [provider]  famotidine (PEPCID) 20 MG tablet Take 20 mg by mouth daily as needed for heartburn or indigestion.    [provider]  misoprostol (CYTOTEC) 200 MCG tablet Place four tablets in between your gums and cheeks (two tablets on each side) 06/25/22   [name], DO  norethindrone (MICRONOR) 0.35 MG tablet Take 1 tablet (0.35 mg total) by mouth daily. 02/12/22 05/13/22  Myna Hidalgo, DO  oxyCODONE-acetaminophen (PERCOCET/ROXICET) 5-325 MG tablet Take 1-2 tablets by mouth every 6 (six) hours as needed. 06/25/22   [name], DO  PARoxetine (PAXIL) 30 MG tablet Take 1 tablet (30 mg total) by mouth daily. 04/11/22   Gilmore Laroche, FNP  Prenatal Vit-Fe Fumarate-FA (PRENATAL MULTIVITAMIN) TABS tablet Take 1 tablet by mouth daily at 12 noon.    [provider]  pyridOXINE (VITAMIN B6) 25 MG tablet Take 1 tablet (25 mg total) by mouth every 6 (six) hours  as needed. 06/14/22   Gilmore Laroche, FNP      Allergies    Penicillins and Naproxen    Review of Systems   Review of Systems  Genitourinary:        Recent failed pregnancy  All other systems reviewed and are negative.   Physical Exam Updated Vital Signs BP 134/84 (BP Location: Right Arm)   Pulse 91   Temp 98.6 F (37 C) (Oral)   Resp 17   Ht 5\' 6"  (1.676 m)   Wt 90.7 kg   LMP 05/13/2022 (Approximate)   SpO2 93%   Breastfeeding Unknown   BMI 32.28 kg/m  Physical Exam Vitals and nursing note reviewed.  Constitutional:      General: She is not in acute distress.    Appearance: She is well-developed. She is obese.  HENT:     Head: Normocephalic and atraumatic.  Eyes:     Conjunctiva/sclera: Conjunctivae normal.  Cardiovascular:     Rate and Rhythm: Normal rate and regular rhythm.     Pulses: Normal pulses.  Pulmonary:     Effort: Pulmonary effort is normal. No respiratory distress.     Breath sounds: No stridor.  Abdominal:     General: There is no distension.  Musculoskeletal:       Arms:  Skin:    General: Skin is warm and dry.  Neurological:     Mental  Status: She is alert and oriented to person, place, and time.     Cranial Nerves: No cranial nerve deficit.  Psychiatric:        Mood and Affect: Mood normal.     ED Results / Procedures / Treatments   Labs (all labs ordered are listed, but only abnormal results are displayed) Labs Reviewed - No data to display  EKG None  Radiology DG Humerus Left  Result Date: 07/05/2022 CLINICAL DATA:  Left arm pain. EXAM: LEFT HUMERUS - 2+ VIEW COMPARISON:  None Available. FINDINGS: The shoulder and elbow joints are maintained. No acute bony abnormality involving the left humerus. IMPRESSION: No acute bony findings. Electronically Signed   By: Rudie Meyer M.D.   On: 07/05/2022 14:47   DG Elbow Complete Left  Result Date: 07/05/2022 CLINICAL DATA:  Left elbow pain.  No known injury. EXAM: LEFT ELBOW -  COMPLETE 3+ VIEW COMPARISON:  None Available. FINDINGS: Examination is limited due to patient's intolerance for pain. No acute bony findings or degenerative changes. Cannot comment on presence or absence of an effusion. IMPRESSION: Limited examination. No acute bony findings or degenerative changes. Electronically Signed   By: Rudie Meyer M.D.   On: 07/05/2022 14:46   DG Shoulder Left  Result Date: 07/05/2022 CLINICAL DATA:  Left shoulder pain. EXAM: LEFT SHOULDER - 2+ VIEW COMPARISON:  None Available. FINDINGS: The glenohumeral and AC joints are maintained. No acute fracture. No abnormal soft tissue calcifications. The visualized left ribs are intact and the visualized left lung is clear. IMPRESSION: Normal left shoulder radiographs. Electronically Signed   By: Rudie Meyer M.D.   On: 07/05/2022 14:45    Procedures Procedures    Medications Ordered in ED Medications  dexamethasone (DECADRON) injection 10 mg (has no administration in time range)    ED Course/ Medical Decision Making/ A&P                           Medical Decision Making Adult female presents with upper extremity pain.  No erythema, no warmth, no true loss of range of motion though there is some pain.  Little evidence for DVT, some suspicion for musculoskeletal etiology.  Patient started on anti-inflammatories, provided sling, will follow-up with Ortho  Amount and/or Complexity of Data Reviewed Independent Historian: friend External Data Reviewed: notes.    Details: Recent missed miscarriage Radiology: ordered. Decision-making details documented in ED Course.    Details: X-rays reviewed negative shoulder, wrist, elbow  Risk Prescription drug management.  Final Clinical Impression(s) / ED Diagnoses Final diagnoses:  Elbow pain, left    Rx / DC Orders ED Discharge Orders          Ordered    predniSONE (DELTASONE) 20 MG tablet  Daily with breakfast        07/05/22 1843              Gerhard Munch,  MD 07/05/22 1851

## 2022-07-05 NOTE — Discharge Instructions (Addendum)
As discussed, you have received an initial dose of anti-inflammatory medicine here and a prescription for the next few days.  Please use this medication and your sling for pain relief.  Please be sure to follow-up with our orthopedic colleagues.  Return here for concerning changes in your condition.

## 2022-07-09 ENCOUNTER — Encounter: Payer: Self-pay | Admitting: Women's Health

## 2022-07-09 ENCOUNTER — Ambulatory Visit (INDEPENDENT_AMBULATORY_CARE_PROVIDER_SITE_OTHER): Payer: Medicaid Other | Admitting: Women's Health

## 2022-07-09 VITALS — BP 124/86 | HR 86 | Ht 65.0 in | Wt 230.0 lb

## 2022-07-09 DIAGNOSIS — Z3A01 Less than 8 weeks gestation of pregnancy: Secondary | ICD-10-CM | POA: Diagnosis not present

## 2022-07-09 DIAGNOSIS — O039 Complete or unspecified spontaneous abortion without complication: Secondary | ICD-10-CM | POA: Diagnosis not present

## 2022-07-09 NOTE — Progress Notes (Signed)
GYN VISIT Patient name: Julie Case MRN [id number]  Date of birth: [birth year] Chief Complaint:   Miscarriage  History of Present Illness:   Julie Case is a 31 y.o. [phone] Caucasian female being seen today for f/u after missed ab. Was seen in [facility] 11/27,  HCG was down to 433 from 506 five days earlier and u/s revealed MSD [redacted]w[redacted]d and no YS (5d prior MSD [redacted]w[redacted]d w/ +YS, no embryo). Rh+. Was given rx for cytotec . States she took that day, had heavy bleeding/cramping and then improved. Some occ sharp pelvic pains since. States she was told she had ovarian cyst. Last u/s on 11/27 showed Rt corpus luteum. Scared to try again for pregnancy. Reports 3 living children, then ectopic and 2 SABs w/in this year.  Patient's last menstrual period was 05/13/2022 (approximate). The current method of family planning is abstinence.  Last pap 02/12/22. Results were: NILM w/ HRHPV negative     06/14/2022    3:04 PM 05/09/2022    4:17 PM 04/11/2022    1:20 PM 03/01/2022   11:38 AM 01/24/2022    2:27 PM  Depression screen PHQ 2/9  Decreased Interest 0 0 0 0 3  Down, Depressed, Hopeless 0 0 0 1 3  PHQ - 2 Score 0 0 0 1 6  Altered sleeping 0   3 3  Tired, decreased energy 0   0 3  Change in appetite 0   0 3  Feeling bad or failure about yourself  0   0 3  Trouble concentrating 0   0 0  Moving slowly or fidgety/restless 0   0 3  Suicidal thoughts 0   0 3  PHQ-9 Score 0   4 24  Difficult doing work/chores Not difficult at all   Somewhat difficult Very difficult        06/14/2022    3:04 PM  GAD 7 : Generalized Anxiety Score  Nervous, Anxious, on Edge 0  Control/stop worrying 0  Worry too much - different things 0  Trouble relaxing 3  Restless 0  Easily annoyed or irritable 0  Afraid - awful might happen 0  Total GAD 7 Score 3  Anxiety Difficulty Somewhat difficult     Review of Systems:   Pertinent items are noted in HPI Denies fever/chills, dizziness, headaches, visual disturbances, fatigue,  shortness of breath, chest pain, abdominal pain, vomiting, abnormal vaginal discharge/itching/odor/irritation, problems with periods, bowel movements, urination, or intercourse unless otherwise stated above.  Pertinent History Reviewed:  Reviewed past medical,surgical, social, obstetrical and family history.  Reviewed problem list, medications and allergies. Physical Assessment:   Vitals:   07/09/22 1154  BP: 124/86  Pulse: 86  Weight: 230 lb (104.3 kg)  Height: 5\' 5"  (1.651 m)  Body mass index is 38.27 kg/m.       Physical Examination:   General appearance: alert, well appearing, and in no distress  Mental status: alert, oriented to person, place, and time  Skin: warm & dry   Cardiovascular: normal heart rate noted  Respiratory: normal respiratory effort, no distress  Abdomen: soft, non-tender   Pelvic: examination not indicated  Extremities: no edema   Chaperone: N/A    No results found for this or any previous visit (from the past 24 hour(s)).  Assessment & Plan:  1) S/P SAB> Rh+, will repeat HCG today  2) Ectopic & 2 SABs this year> offered appt w/ MD to discuss further testing, etc- would like to do. Prefers female  Meds: No orders of the defined types were placed in this encounter.   Orders Placed This Encounter  Procedures   Beta hCG quant (ref lab)    Return for 1st available w/ Dr. Charlotta Newton .  Cheral Marker CNM, [facility] 07/09/2022 12:20 PM

## 2022-07-10 LAB — BETA HCG QUANT (REF LAB): hCG Quant: <1 m[IU]/mL

## 2022-07-11 ENCOUNTER — Other Ambulatory Visit: Payer: Medicaid Other

## 2022-07-12 ENCOUNTER — Encounter: Payer: Self-pay | Admitting: Obstetrics & Gynecology

## 2022-07-12 ENCOUNTER — Ambulatory Visit (INDEPENDENT_AMBULATORY_CARE_PROVIDER_SITE_OTHER): Payer: Medicaid Other | Admitting: Obstetrics & Gynecology

## 2022-07-12 ENCOUNTER — Other Ambulatory Visit: Payer: Medicaid Other

## 2022-07-12 VITALS — BP 114/81 | HR 90 | Ht 65.0 in | Wt 227.0 lb

## 2022-07-12 DIAGNOSIS — Z3A00 Weeks of gestation of pregnancy not specified: Secondary | ICD-10-CM | POA: Diagnosis not present

## 2022-07-12 DIAGNOSIS — N96 Recurrent pregnancy loss: Secondary | ICD-10-CM

## 2022-07-12 DIAGNOSIS — F329 Major depressive disorder, single episode, unspecified: Secondary | ICD-10-CM | POA: Diagnosis not present

## 2022-07-12 DIAGNOSIS — O039 Complete or unspecified spontaneous abortion without complication: Secondary | ICD-10-CM | POA: Diagnosis not present

## 2022-07-12 NOTE — Progress Notes (Signed)
GYN VISIT Patient name: Julie Case MRN [id number]  Date of birth: 10/08/1990 Chief Complaint:   Follow-up (Discuss future pregnancies)  History of Present Illness:   Julie Case is a 31 y.o. [phone] female being seen today for miscarriage follow up.     Feeling a little depressed about things, but doing ok.  Sometimes when she is home by herself during the day, it can be challenging.  She does feel like her current medication is helping.  In the past, she has been involved with therapy and does feel like that would be helpful.  Denies SI/HI.  OB history reviewed.  With current partner, she has had 2 successful FTNSVD. 2022- ectopic 2023- first trimester SAB x 2- each pregnancy <8wks.  Required medication with most recent miscarriage.  FOB- 31yo, denies medical problems  She notes menses regular without issues.  She does report remote h/o thyroid concerns immediately following one of her pregnancies.  No medication currently.  Currently, they are planning to take a break from actively trying- plan for condoms only, but may consider a pregnancy in the future.  Patient's last menstrual period was 05/13/2022 (approximate).     06/14/2022    3:04 PM 05/09/2022    4:17 PM 04/11/2022    1:20 PM 03/01/2022   11:38 AM 01/24/2022    2:27 PM  Depression screen PHQ 2/9  Decreased Interest 0 0 0 0 3  Down, Depressed, Hopeless 0 0 0 1 3  PHQ - 2 Score 0 0 0 1 6  Altered sleeping 0   3 3  Tired, decreased energy 0   0 3  Change in appetite 0   0 3  Feeling bad or failure about yourself  0   0 3  Trouble concentrating 0   0 0  Moving slowly or fidgety/restless 0   0 3  Suicidal thoughts 0   0 3  PHQ-9 Score 0   4 24  Difficult doing work/chores Not difficult at all   Somewhat difficult Very difficult     Review of Systems:   Pertinent items are noted in HPI Denies fever/chills, dizziness, headaches, visual disturbances, fatigue, shortness of breath, chest pain, abdominal pain, vomiting, no  problems with periods, bowel movements, urination, or intercourse unless otherwise stated above.  Pertinent History Reviewed:  Reviewed past medical,surgical, social, obstetrical and family history.  Reviewed problem list, medications and allergies. Physical Assessment:   Vitals:   07/12/22 1110  BP: 114/81  Pulse: 90  Weight: 227 lb (103 kg)  Height: 5\' 5"  (1.651 m)  Body mass index is 37.77 kg/m.       Physical Examination:   General appearance: alert, well appearing, and in no distress  Psych: mood appropriate, normal affect  Skin: warm & dry   Cardiovascular: normal heart rate noted  Respiratory: normal respiratory effort, no distress  Abdomen: soft, non-tender   Pelvic: examination not indicated  Extremities: no edema   Chaperone: N/A    Assessment & Plan:  1) Miscarriage -discussed definition of RPL- which is >3 miscarriages -based on prior FTNSVD as well as miscarriage- unlikely tubal or uterine factor contributing to miscarriage -discussed potential genetic component that is more likely when >35yo.  Testing can be done on products of conception should this occur again -advised lab work to r/o other medical components such as DM or thyroid disease -no further testing at this time indicated -reassured pt that even with SAB x 2, a successful pregnancy in the  future is likely -questions/concerns were addressed  2) Depression -continue current meds -referral to [facility] sent  Orders Placed This Encounter  Procedures   TSH   Prolactin   HgB A1c    Return in about 1 year (around 07/13/2023) for Annual.   Myna Hidalgo, DO Attending Obstetrician & Gynecologist, [facility] for [facility], [facility]

## 2022-07-13 ENCOUNTER — Encounter: Payer: Self-pay | Admitting: Family Medicine

## 2022-07-13 ENCOUNTER — Ambulatory Visit (INDEPENDENT_AMBULATORY_CARE_PROVIDER_SITE_OTHER): Payer: Medicaid Other | Admitting: Family Medicine

## 2022-07-13 VITALS — BP 128/86 | HR 108 | Ht 65.0 in | Wt 228.0 lb

## 2022-07-13 DIAGNOSIS — E559 Vitamin D deficiency, unspecified: Secondary | ICD-10-CM | POA: Diagnosis not present

## 2022-07-13 DIAGNOSIS — R7301 Impaired fasting glucose: Secondary | ICD-10-CM | POA: Diagnosis not present

## 2022-07-13 DIAGNOSIS — E7849 Other hyperlipidemia: Secondary | ICD-10-CM

## 2022-07-13 DIAGNOSIS — E038 Other specified hypothyroidism: Secondary | ICD-10-CM | POA: Diagnosis not present

## 2022-07-13 DIAGNOSIS — F320 Major depressive disorder, single episode, mild: Secondary | ICD-10-CM | POA: Diagnosis not present

## 2022-07-13 DIAGNOSIS — M25512 Pain in left shoulder: Secondary | ICD-10-CM | POA: Diagnosis not present

## 2022-07-13 MED ORDER — PAROXETINE HCL 30 MG PO TABS: 30.0000 mg | ORAL_TABLET | Freq: Every day | ORAL | 2 refills | Status: DC

## 2022-07-13 NOTE — Assessment & Plan Note (Signed)
She was seen in the ED for left elbow and shoulder pain Imaging studies were unremarkable Shoulder sling in place She reports taking Percocet 5 mg as needed for pain She reports pain 6 out of 10 She will be following up with orthopedics next week

## 2022-07-13 NOTE — Progress Notes (Signed)
Established Patient Office Visit  Subjective:  Patient ID: Julie Case, female    DOB: 12-13-1990  Age: 31 y.o. MRN: [id number]  CC:  Chief Complaint  Patient presents with   Follow-up    3 month f/u.    Arm Pain    Pt reports left shoulder/arm pain radiating to her elbow onset of this began 06/28/2022, went to ED on 07/05/2022 had xray done looked negative, states pain has not gone away.     HPI Julie Case is a 31 y.o. female with past medical history of miscarriages, and depression presents for f/u of  chronic medical conditions. For the details of today's visit, please refer to the assessment and plan.     Past Medical History:  Diagnosis Date   Depression    Epilepsy ([place])     Past Surgical History:  Procedure Laterality Date   CHOLECYSTECTOMY     ECTOPIC PREGNANCY SURGERY     with right tube removal    Family History  Problem Relation Age of Onset   Hypertension Other    Diabetes Other     Social History   Socioeconomic History   Marital status: Single    Spouse name: Not on file   Number of children: Not on file   Years of education: Not on file   Highest education level: Not on file  Occupational History   Not on file  Tobacco Use   Smoking status: Every Day    Types: Cigarettes   Smokeless tobacco: Never  Vaping Use   Vaping Use: Never used  Substance and Sexual Activity   Alcohol use: Never   Drug use: Never   Sexual activity: Not Currently    Birth control/protection: None  Other Topics Concern   Not on file  Social History Narrative   Not on file   Social Determinants of Health   Financial Resource Strain: Not on file  Food Insecurity: Not on file  Transportation Needs: Not on file  Physical Activity: Not on file  Stress: Not on file  Social Connections: Not on file  Intimate Partner Violence: Not on file    Outpatient Medications Prior to Visit  Medication Sig Dispense Refill   oxyCODONE-acetaminophen (PERCOCET/ROXICET) 5-325 MG  tablet Take 1-2 tablets by mouth every 6 (six) hours as needed. 30 tablet 0   acetaminophen (TYLENOL) 500 MG tablet Take 500 mg by mouth every 6 (six) hours as needed for mild pain. (Patient not taking: Reported on 07/13/2022)     famotidine (PEPCID) 20 MG tablet Take 20 mg by mouth daily as needed for heartburn or indigestion. (Patient not taking: Reported on 07/13/2022)     norethindrone (MICRONOR) 0.35 MG tablet Take 1 tablet (0.35 mg total) by mouth daily. 90 tablet 4   predniSONE (DELTASONE) 20 MG tablet Take 2 tablets (40 mg total) by mouth daily with breakfast. For the next four days (Patient not taking: Reported on 07/13/2022) 8 tablet 0   Prenatal Vit-Fe Fumarate-FA (PRENATAL MULTIVITAMIN) TABS tablet Take 1 tablet by mouth daily at 12 noon. (Patient not taking: Reported on 07/13/2022)     pyridOXINE (VITAMIN B6) 25 MG tablet Take 1 tablet (25 mg total) by mouth every 6 (six) hours as needed. (Patient not taking: Reported on 07/13/2022) 30 tablet 0   PARoxetine (PAXIL) 30 MG tablet Take 1 tablet (30 mg total) by mouth daily. (Patient not taking: Reported on 07/13/2022) 30 tablet 1   No facility-administered medications prior to visit.  Allergies  Allergen Reactions   Penicillins Shortness Of Breath and Rash   Naproxen Nausea And Vomiting    ROS Review of Systems  Constitutional:  Negative for chills and fever.  Eyes:  Negative for visual disturbance.  Respiratory:  Negative for chest tightness and shortness of breath.   Musculoskeletal:        Left shoulder pain  Neurological:  Negative for dizziness and headaches.      Objective:    Physical Exam HENT:     Head: Normocephalic.     Mouth/Throat:     Mouth: Mucous membranes are moist.  Cardiovascular:     Rate and Rhythm: Normal rate.     Heart sounds: Normal heart sounds.  Pulmonary:     Effort: Pulmonary effort is normal.     Breath sounds: Normal breath sounds.  Musculoskeletal:     Comments: Patient left  shoulder is in a sling No signs of compartment syndrome noted  +2 radial pulse of the left hand Pain with palpation of the left acromion and deltoid muscles   Neurological:     Mental Status: She is alert.     BP 128/86 (BP Location: Left Arm)   Pulse (!) 108   Ht _0  (1.651 m)   Wt 228 lb (103.4 kg)   LMP 05/13/2022 (Approximate)   SpO2 96%   Breastfeeding No   BMI 37.94 kg/m  Wt Readings from Last 3 Encounters:  07/13/22 228 lb (103.4 kg)  07/12/22 227 lb (103 kg)  07/09/22 230 lb (104.3 kg)    No results found for: "TSH" Lab Results  Component Value Date   WBC 7.7 06/25/2022   HGB 14.0 06/25/2022   HCT 40.5 06/25/2022   MCV 86.7 06/25/2022   PLT 219 06/25/2022   Lab Results  Component Value Date   NA 140 05/13/2022   K 3.8 05/13/2022   CO2 24 05/13/2022   GLUCOSE 96 05/13/2022   BUN 9 05/13/2022   CREATININE 0.50 05/13/2022   BILITOT 0.5 01/19/2022   ALKPHOS 80 01/19/2022   AST 13 (L) 01/19/2022   ALT 17 01/19/2022   PROT 7.3 01/19/2022   ALBUMIN 4.1 01/19/2022   CALCIUM 9.5 05/13/2022   ANIONGAP 7 05/13/2022   No results found for: "CHOL" No results found for: "HDL" No results found for: "LDLCALC" No results found for: "TRIG" No results found for: "CHOLHDL" No results found for: "HGBA1C"    Assessment & Plan:  Current mild episode of major depressive disorder, unspecified whether recurrent ([place]) Assessment & Plan: She denies suicidal ideation homicidal ideation Will reinstate Paxil 30 mg daily to start taking   Orders: -     PARoxetine HCl; Take 1 tablet (30 mg total) by mouth daily.  Dispense: 30 tablet; Refill: 2  Acute pain of left shoulder Assessment & Plan: She was seen in the ED for left elbow and shoulder pain Imaging studies were unremarkable Shoulder sling in place She reports taking Percocet 5 mg as needed for pain She reports pain 6 out of 10 She will be following up with orthopedics next week   IFG (impaired fasting  glucose) -     Hemoglobin A1c  Vitamin D deficiency -     VITAMIN D 25 Hydroxy (Vit-D Deficiency, Fractures)  Other specified hypothyroidism -     TSH + free T4  Other hyperlipidemia -     Lipid panel -     CMP14+EGFR -     CBC with Differential/Platelet  Follow-up: Return in about 3 months (around 10/12/2022).   Alvira Monday, FNP

## 2022-07-13 NOTE — Assessment & Plan Note (Signed)
She denies suicidal ideation homicidal ideation Will reinstate Paxil 30 mg daily to start taking

## 2022-07-13 NOTE — Patient Instructions (Addendum)
I appreciate the opportunity to provide care to you today!    Follow up:  3 months  Labs: please stop by the lab during the week to get your blood drawn (CBC, CMP, TSH, Lipid profile, HgA1c, Vit D)  A prescription of Paxil 30 mg has been sent to your pharmacy Please continue to take Oxy 5 mg as needed for pain control Please schedule an appointment with orthopedics to follow-up as soon as you can    Please continue to a heart-healthy diet and increase your physical activities. Try to exercise for at least three times a week.      It was a pleasure to see you and I look forward to continuing to work together on your health and well-being. Please do not hesitate to call the office if you need care or have questions about your care.   Have a wonderful day and week. With Gratitude, Gilmore Laroche MSN, FNP-BC

## 2022-07-17 DIAGNOSIS — R7301 Impaired fasting glucose: Secondary | ICD-10-CM | POA: Diagnosis not present

## 2022-07-17 DIAGNOSIS — E7849 Other hyperlipidemia: Secondary | ICD-10-CM | POA: Diagnosis not present

## 2022-07-17 DIAGNOSIS — E559 Vitamin D deficiency, unspecified: Secondary | ICD-10-CM | POA: Diagnosis not present

## 2022-07-17 DIAGNOSIS — E038 Other specified hypothyroidism: Secondary | ICD-10-CM | POA: Diagnosis not present

## 2022-07-18 ENCOUNTER — Telehealth: Payer: Self-pay | Admitting: Family Medicine

## 2022-07-18 ENCOUNTER — Other Ambulatory Visit: Payer: Self-pay | Admitting: Family Medicine

## 2022-07-18 DIAGNOSIS — E559 Vitamin D deficiency, unspecified: Secondary | ICD-10-CM

## 2022-07-18 LAB — TSH+FREE T4
Free T4: 1.04 ng/dL (ref 0.82–1.77)
TSH: 3.76 u[IU]/mL (ref 0.450–4.500)

## 2022-07-18 LAB — CMP14+EGFR
ALT: 13 IU/L (ref 0–32)
AST: 10 IU/L (ref 0–40)
Albumin/Globulin Ratio: 1.9 (ref 1.2–2.2)
Albumin: 4.5 g/dL (ref 3.9–4.9)
Alkaline Phosphatase: 107 IU/L (ref 44–121)
BUN/Creatinine Ratio: 15 (ref 9–23)
BUN: 10 mg/dL (ref 6–20)
Bilirubin Total: 0.3 mg/dL (ref 0.0–1.2)
CO2: 20 mmol/L (ref 20–29)
Calcium: 9.9 mg/dL (ref 8.7–10.2)
Chloride: 103 mmol/L (ref 96–106)
Creatinine, Ser: 0.65 mg/dL (ref 0.57–1.00)
Globulin, Total: 2.4 g/dL (ref 1.5–4.5)
Glucose: 94 mg/dL (ref 70–99)
Potassium: 4.5 mmol/L (ref 3.5–5.2)
Sodium: 139 mmol/L (ref 134–144)
Total Protein: 6.9 g/dL (ref 6.0–8.5)
eGFR: 119 mL/min/{1.73_m2} (ref >59)

## 2022-07-18 LAB — LIPID PANEL
Chol/HDL Ratio: 5.1 ratio — ABNORMAL HIGH (ref 0.0–4.4)
Cholesterol, Total: 202 mg/dL — ABNORMAL HIGH (ref 100–199)
HDL: 40 mg/dL (ref >39)
LDL Chol Calc (NIH): 137 mg/dL — ABNORMAL HIGH (ref 0–99)
Triglycerides: 139 mg/dL (ref 0–149)
VLDL Cholesterol Cal: 25 mg/dL (ref 5–40)

## 2022-07-18 LAB — CBC WITH DIFFERENTIAL/PLATELET
Basophils Absolute: 0.1 10*3/uL (ref 0.0–0.2)
Basos: 1 %
EOS (ABSOLUTE): 0.4 10*3/uL (ref 0.0–0.4)
Eos: 3 %
Hematocrit: 46.4 % (ref 34.0–46.6)
Hemoglobin: 15.5 g/dL (ref 11.1–15.9)
Immature Grans (Abs): 0 10*3/uL (ref 0.0–0.1)
Immature Granulocytes: 0 %
Lymphocytes Absolute: 2.2 10*3/uL (ref 0.7–3.1)
Lymphs: 19 %
MCH: 29.1 pg (ref 26.6–33.0)
MCHC: 33.4 g/dL (ref 31.5–35.7)
MCV: 87 fL (ref 79–97)
Monocytes Absolute: 0.8 10*3/uL (ref 0.1–0.9)
Monocytes: 7 %
Neutrophils Absolute: 8 10*3/uL — ABNORMAL HIGH (ref 1.4–7.0)
Neutrophils: 70 %
Platelets: 297 10*3/uL (ref 150–450)
RBC: 5.32 x10E6/uL — ABNORMAL HIGH (ref 3.77–5.28)
RDW: 12.5 % (ref 11.7–15.4)
WBC: 11.5 10*3/uL — ABNORMAL HIGH (ref 3.4–10.8)

## 2022-07-18 LAB — HEMOGLOBIN A1C
Est. average glucose Bld gHb Est-mCnc: 100 mg/dL
Hgb A1c MFr Bld: 5.1 % (ref 4.8–5.6)

## 2022-07-18 LAB — VITAMIN D 25 HYDROXY (VIT D DEFICIENCY, FRACTURES): Vit D, 25-Hydroxy: 18.3 ng/mL — ABNORMAL LOW (ref 30.0–100.0)

## 2022-07-18 MED ORDER — VITAMIN D (ERGOCALCIFEROL) 1.25 MG (50000 UNIT) PO CAPS: 50000.0000 [IU] | ORAL_CAPSULE | ORAL | 2 refills | Status: DC

## 2022-07-18 NOTE — Progress Notes (Signed)
Please inform the patient that her vitamin D is low.  A prescription for weekly vitamin D supplements has been sent to her pharmacy.  Her cholesterol is elevated; I recommend a low-carb and fat diet with increased physical activities.  Her white blood cells and red blood cells are slightly elevated; nothing concerning. I will continue to monitor it.  All other labs are stable.

## 2022-07-18 NOTE — Telephone Encounter (Signed)
Patient calling about lab results. 

## 2022-07-18 NOTE — Telephone Encounter (Signed)
Please advice on lab results?  

## 2022-07-20 ENCOUNTER — Telehealth: Payer: Self-pay | Admitting: Clinical

## 2022-07-20 NOTE — Telephone Encounter (Signed)
Attempt call regarding referral; Left HIPPA-compliant message to call back Heily Carlucci from [facility] at  [phone] (Gethsemane Fischler's office).    

## 2022-08-06 ENCOUNTER — Encounter: Payer: Self-pay | Admitting: Family Medicine

## 2022-08-06 ENCOUNTER — Ambulatory Visit (INDEPENDENT_AMBULATORY_CARE_PROVIDER_SITE_OTHER): Payer: Medicaid Other | Admitting: Family Medicine

## 2022-08-06 VITALS — BP 124/82 | HR 110 | Ht 66.0 in | Wt 228.1 lb

## 2022-08-06 DIAGNOSIS — M5442 Lumbago with sciatica, left side: Secondary | ICD-10-CM | POA: Diagnosis not present

## 2022-08-06 MED ORDER — TIZANIDINE HCL 4 MG PO TABS: 4.0000 mg | ORAL_TABLET | Freq: Four times a day (QID) | ORAL | 0 refills | Status: DC | PRN

## 2022-08-06 MED ORDER — DICLOFENAC SODIUM 1 % EX GEL: 2.0000 g | Freq: Four times a day (QID) | CUTANEOUS | 0 refills | Status: DC

## 2022-08-06 MED ORDER — PREDNISONE 20 MG PO TABS: 40.0000 mg | ORAL_TABLET | Freq: Every day | ORAL | 0 refills | Status: DC

## 2022-08-06 NOTE — Assessment & Plan Note (Signed)
History of degenerative changes in the lumbar spine Complains of a dull sharp aching pain Pain is rated 6 out of 10 She has been applying heat application with moderate relief of symptoms Will treat today with prednisone taper and Zanaflex 4 mg to take every 6 hours Encouraged continued use of heat applications to the lower back

## 2022-08-06 NOTE — Patient Instructions (Addendum)
I appreciate the opportunity to provide care to you today!    Follow up:  10/12/22   Please pick up your medications at the pharmacy  Conservative managements for Low Back Pain include Heat therapy --  helps reduce muscle spasm Cold Therapy- helps reduce edema Take OTC: extra strength tylenol      Please continue to a heart-healthy diet and increase your physical activities. Try to exercise for 43mins at least three times a week.      It was a pleasure to see you and I look forward to continuing to work together on your health and well-being. Please do not hesitate to call the office if you need care or have questions about your care.   Have a wonderful day and week. With Gratitude, Alvira Monday MSN, FNP-BC

## 2022-08-06 NOTE — Progress Notes (Signed)
Acute Office Visit  Subjective:    Patient ID: Julie Case, female    DOB: 08/28/1990, 32 y.o.   MRN: [id number]  Chief Complaint  Patient presents with   Back Pain    Pt reports arthritis flare up in her back has only been using a heating pad. Started on 08/01/2021.    HPI Patient is in today for chronic low back pain. For the details of today's visit, please refer to the assessment and plan.     Past Medical History:  Diagnosis Date   Depression    Epilepsy ([place])     Past Surgical History:  Procedure Laterality Date   CHOLECYSTECTOMY     ECTOPIC PREGNANCY SURGERY     with right tube removal    Family History  Problem Relation Age of Onset   Hypertension Other    Diabetes Other     Social History   Socioeconomic History   Marital status: Single    Spouse name: Not on file   Number of children: Not on file   Years of education: Not on file   Highest education level: Not on file  Occupational History   Not on file  Tobacco Use   Smoking status: Every Day    Types: Cigarettes   Smokeless tobacco: Never   Tobacco comments:    1/2 pack a day  Vaping Use   Vaping Use: Never used  Substance and Sexual Activity   Alcohol use: Never   Drug use: Never   Sexual activity: Not Currently    Birth control/protection: None  Other Topics Concern   Not on file  Social History Narrative   Not on file   Social Determinants of Health   Financial Resource Strain: Not on file  Food Insecurity: Not on file  Transportation Needs: Not on file  Physical Activity: Not on file  Stress: Not on file  Social Connections: Not on file  Intimate Partner Violence: Not on file    Outpatient Medications Prior to Visit  Medication Sig Dispense Refill   oxyCODONE-acetaminophen (PERCOCET/ROXICET) 5-325 MG tablet Take 1-2 tablets by mouth every 6 (six) hours as needed. 30 tablet 0   PARoxetine (PAXIL) 30 MG tablet Take 1 tablet (30 mg total) by mouth daily. 30 tablet 2    norethindrone (MICRONOR) 0.35 MG tablet Take 1 tablet (0.35 mg total) by mouth daily. 90 tablet 4   acetaminophen (TYLENOL) 500 MG tablet Take 500 mg by mouth every 6 (six) hours as needed for mild pain. (Patient not taking: Reported on 07/13/2022)     famotidine (PEPCID) 20 MG tablet Take 20 mg by mouth daily as needed for heartburn or indigestion. (Patient not taking: Reported on 07/13/2022)     predniSONE (DELTASONE) 20 MG tablet Take 2 tablets (40 mg total) by mouth daily with breakfast. For the next four days (Patient not taking: Reported on 07/13/2022) 8 tablet 0   Prenatal Vit-Fe Fumarate-FA (PRENATAL MULTIVITAMIN) TABS tablet Take 1 tablet by mouth daily at 12 noon. (Patient not taking: Reported on 07/13/2022)     pyridOXINE (VITAMIN B6) 25 MG tablet Take 1 tablet (25 mg total) by mouth every 6 (six) hours as needed. (Patient not taking: Reported on 07/13/2022) 30 tablet 0   Vitamin D, Ergocalciferol, (DRISDOL) 1.25 MG (50000 UNIT) CAPS capsule Take 1 capsule (50,000 Units total) by mouth every 7 (seven) days. 7 capsule 2   No facility-administered medications prior to visit.    Allergies  Allergen Reactions  Penicillins Shortness Of Breath and Rash   Naproxen Nausea And Vomiting    Review of Systems  Constitutional:  Negative for chills and fever.  Eyes:  Negative for visual disturbance.  Respiratory:  Negative for chest tightness and shortness of breath.   Musculoskeletal:  Positive for back pain.  Neurological:  Negative for dizziness and headaches.       Objective:    Physical Exam HENT:     Head: Normocephalic.     Mouth/Throat:     Mouth: Mucous membranes are moist.  Cardiovascular:     Rate and Rhythm: Normal rate.     Heart sounds: Normal heart sounds.  Pulmonary:     Effort: Pulmonary effort is normal.     Breath sounds: Normal breath sounds.  Musculoskeletal:     Comments: Pain with palpation of the lumbar spine Negative CVA tenderness No overlying skin  changes  Neurological:     Mental Status: She is alert.     BP 124/82 (BP Location: Left Arm)   Pulse (!) 110   Ht 5\' 6"  (1.676 m)   Wt 228 lb 1.3 oz (103.5 kg)   LMP 07/27/2022   SpO2 94%   Breastfeeding No   BMI 36.81 kg/m  Wt Readings from Last 3 Encounters:  08/06/22 228 lb 1.3 oz (103.5 kg)  07/13/22 228 lb (103.4 kg)  07/12/22 227 lb (103 kg)       Assessment & Plan:  Acute bilateral low back pain with left-sided sciatica Assessment & Plan: History of degenerative changes in the lumbar spine Complains of a dull sharp aching pain Pain is rated 6 out of 10 She has been applying heat application with moderate relief of symptoms Will treat today with prednisone taper and Zanaflex 4 mg to take every 6 hours Encouraged continued use of heat applications to the lower back   Orders: -     tiZANidine HCl; Take 1 tablet (4 mg total) by mouth every 6 (six) hours as needed for muscle spasms.  Dispense: 30 tablet; Refill: 0 -     Diclofenac Sodium; Apply 2 g topically 4 (four) times daily.  Dispense: 2 g; Refill: 0 -     predniSONE; Take 2 tablets (40 mg total) by mouth daily with breakfast. For the next four days  Dispense: 8 tablet; Refill: 0    07/14/22, FNP

## 2022-08-10 ENCOUNTER — Other Ambulatory Visit: Payer: Self-pay | Admitting: Family Medicine

## 2022-08-10 DIAGNOSIS — M5442 Lumbago with sciatica, left side: Secondary | ICD-10-CM

## 2022-09-04 ENCOUNTER — Emergency Department (HOSPITAL_COMMUNITY): Payer: Medicaid Other

## 2022-09-04 ENCOUNTER — Telehealth: Payer: Medicaid Other | Admitting: Family Medicine

## 2022-09-04 ENCOUNTER — Encounter (HOSPITAL_COMMUNITY): Payer: Self-pay

## 2022-09-04 ENCOUNTER — Other Ambulatory Visit: Payer: Self-pay

## 2022-09-04 ENCOUNTER — Emergency Department (HOSPITAL_COMMUNITY)
Admission: EM | Admit: 2022-09-04 | Discharge: 2022-09-04 | Disposition: A | Discharge status: Home / Self Care | Payer: Medicaid Other | Attending: Emergency Medicine | Admitting: Emergency Medicine

## 2022-09-04 DIAGNOSIS — A084 Viral intestinal infection, unspecified: Secondary | ICD-10-CM | POA: Diagnosis not present

## 2022-09-04 DIAGNOSIS — R509 Fever, unspecified: Secondary | ICD-10-CM | POA: Diagnosis present

## 2022-09-04 DIAGNOSIS — R1031 Right lower quadrant pain: Secondary | ICD-10-CM | POA: Diagnosis not present

## 2022-09-04 DIAGNOSIS — Z1152 Encounter for screening for COVID-19: Secondary | ICD-10-CM | POA: Diagnosis not present

## 2022-09-04 DIAGNOSIS — R1110 Vomiting, unspecified: Secondary | ICD-10-CM | POA: Diagnosis not present

## 2022-09-04 LAB — URINALYSIS, ROUTINE W REFLEX MICROSCOPIC
Bilirubin Urine: NEGATIVE
Glucose, UA: NEGATIVE mg/dL
Hgb urine dipstick: NEGATIVE
Ketones, ur: NEGATIVE mg/dL
Leukocytes,Ua: NEGATIVE
Nitrite: NEGATIVE
Protein, ur: NEGATIVE mg/dL
Specific Gravity, Urine: 1.02 (ref 1.005–1.030)
pH: 5 (ref 5.0–8.0)

## 2022-09-04 LAB — COMPREHENSIVE METABOLIC PANEL
ALT: 20 U/L (ref 0–44)
AST: 17 U/L (ref 15–41)
Albumin: 4.3 g/dL (ref 3.5–5.0)
Alkaline Phosphatase: 87 U/L (ref 38–126)
Anion gap: 10 (ref 5–15)
BUN: 6 mg/dL (ref 6–20)
CO2: 23 mmol/L (ref 22–32)
Calcium: 9.3 mg/dL (ref 8.9–10.3)
Chloride: 104 mmol/L (ref 98–111)
Creatinine, Ser: 0.57 mg/dL (ref 0.44–1.00)
GFR, Estimated: >60 mL/min (ref >60)
Glucose, Bld: 103 mg/dL — ABNORMAL HIGH (ref 70–99)
Potassium: 3.9 mmol/L (ref 3.5–5.1)
Sodium: 137 mmol/L (ref 135–145)
Total Bilirubin: 0.9 mg/dL (ref 0.3–1.2)
Total Protein: 7.6 g/dL (ref 6.5–8.1)

## 2022-09-04 LAB — POC URINE PREG, ED: Preg Test, Ur: NEGATIVE

## 2022-09-04 LAB — CBC
HCT: 45.3 % (ref 36.0–46.0)
Hemoglobin: 15.6 g/dL — ABNORMAL HIGH (ref 12.0–15.0)
MCH: 29.5 pg (ref 26.0–34.0)
MCHC: 34.4 g/dL (ref 30.0–36.0)
MCV: 85.8 fL (ref 80.0–100.0)
Platelets: 268 10*3/uL (ref 150–400)
RBC: 5.28 MIL/uL — ABNORMAL HIGH (ref 3.87–5.11)
RDW: 12.1 % (ref 11.5–15.5)
WBC: 7.9 10*3/uL (ref 4.0–10.5)
nRBC: 0 % (ref 0.0–0.2)

## 2022-09-04 LAB — RESP PANEL BY RT-PCR (RSV, FLU A&B, COVID)  RVPGX2
Influenza A by PCR: NEGATIVE
Influenza B by PCR: NEGATIVE
Resp Syncytial Virus by PCR: NEGATIVE
SARS Coronavirus 2 by RT PCR: NEGATIVE

## 2022-09-04 LAB — LIPASE, BLOOD: Lipase: 29 U/L (ref 11–51)

## 2022-09-04 MED ORDER — MORPHINE SULFATE (PF) 4 MG/ML IV SOLN
4.0000 mg | Freq: Once | INTRAVENOUS | Status: AC
  Administered 2022-09-04: 4 mg via INTRAVENOUS
  Filled 2022-09-04: qty 1

## 2022-09-04 MED ORDER — LOPERAMIDE HCL 2 MG PO CAPS: 2.0000 mg | ORAL_CAPSULE | Freq: Four times a day (QID) | ORAL | 0 refills | Status: DC | PRN

## 2022-09-04 MED ORDER — SODIUM CHLORIDE 0.9 % IV BOLUS
1000.0000 mL | Freq: Once | INTRAVENOUS | Status: AC
  Administered 2022-09-04: 1000 mL via INTRAVENOUS

## 2022-09-04 MED ORDER — IOHEXOL 300 MG/ML  SOLN
100.0000 mL | Freq: Once | INTRAMUSCULAR | Status: AC | PRN
  Administered 2022-09-04: 100 mL via INTRAVENOUS

## 2022-09-04 MED ORDER — ONDANSETRON HCL 4 MG PO TABS: 4.0000 mg | ORAL_TABLET | Freq: Four times a day (QID) | ORAL | 0 refills | Status: DC

## 2022-09-04 MED ORDER — LOPERAMIDE HCL 2 MG PO CAPS
4.0000 mg | ORAL_CAPSULE | Freq: Once | ORAL | Status: AC
  Administered 2022-09-04: 4 mg via ORAL
  Filled 2022-09-04: qty 2

## 2022-09-04 MED ORDER — ONDANSETRON HCL 4 MG/2ML IJ SOLN
4.0000 mg | Freq: Once | INTRAMUSCULAR | Status: AC
  Administered 2022-09-04: 4 mg via INTRAVENOUS
  Filled 2022-09-04: qty 2

## 2022-09-04 NOTE — ED Notes (Signed)
ED Provider at bedside. 

## 2022-09-04 NOTE — ED Triage Notes (Signed)
Pt reports vomiting and diarrhea x 3 days.  

## 2022-09-04 NOTE — ED Notes (Signed)
Patient transported to CT 

## 2022-09-04 NOTE — ED Notes (Signed)
Pt informed of need for urine sample. Stated she does not need to void at this time. Given urine collection cup and ob wipe.

## 2022-09-04 NOTE — Discharge Instructions (Addendum)
Make sure you are drinking plenty of fluids.  Continue using the Zofran as needed for nausea.  Continue to take the Imodium only if you continue to have diarrhea as this can make you quite constipated if you continue to take it beyond its need.

## 2022-09-04 NOTE — ED Provider Notes (Signed)
[facility] Provider Note   CSN: [id number] Arrival date & time: 09/04/22  1326     History  Chief Complaint  Patient presents with   Diarrhea   Emesis    Julie Case is a 32 y.o. female presenting with a 3-day history of subjective fever including chills and hot flashes, nausea and vomiting, reporting about 3 episodes of nonbloody emesis daily along with diarrhea, also nonbloody but too numerous to count.  She also endorses waxing and waning pain in her right lower quadrant which is worse prior to needing to have a bowel movement but also worse with movement and palpation.  She has had increased weakness and lightheadedness today and is concerned about being dehydrated.  She has taken Pepto-Bismol without symptom relief.  She also endorses a mild nonproductive cough, some nasal congestion.  Her significant other at the bedside had respiratory symptoms this past week, was seen here and had a respiratory panel which was negative.  She is not COVID or flu vaccinated.  Surgical history is significant for cholecystectomy and ectopic pregnancy procedure.  She doubts pregnancy, she just finished her last normal menses.  The history is provided by the patient.       Home Medications Prior to Admission medications   Medication Sig Start Date End Date Taking? Authorizing Provider  loperamide (IMODIUM) 2 MG capsule Take 1 capsule (2 mg total) by mouth 4 (four) times daily as needed for diarrhea or loose stools. 09/04/22  Yes Ketzaly Cardella, Almyra Free, PA-C  ondansetron (ZOFRAN) 4 MG tablet Take 1 tablet (4 mg total) by mouth every 6 (six) hours. 09/04/22  Yes Lashawna Poche, Almyra Free, PA-C  diclofenac Sodium (VOLTAREN) 1 % GEL Apply 2 g topically 4 (four) times daily. 08/06/22   Alvira Monday, FNP  norethindrone (MICRONOR) 0.35 MG tablet Take 1 tablet (0.35 mg total) by mouth daily. 02/12/22 05/13/22  Janyth Pupa, DO  oxyCODONE-acetaminophen (PERCOCET/ROXICET) 5-325 MG tablet Take  1-2 tablets by mouth every 6 (six) hours as needed. 06/25/22   Truett Mainland, DO  PARoxetine (PAXIL) 30 MG tablet Take 1 tablet (30 mg total) by mouth daily. 07/13/22   Alvira Monday, FNP  predniSONE (DELTASONE) 20 MG tablet Take 2 tablets (40 mg total) by mouth daily with breakfast. For the next four days 08/06/22   Alvira Monday, FNP  tiZANidine (ZANAFLEX) 4 MG tablet Take 1 tablet (4 mg total) by mouth every 6 (six) hours as needed for muscle spasms. 08/06/22   Alvira Monday, FNP      Allergies    Penicillins and Naproxen    Review of Systems   Review of Systems  Constitutional:  Positive for diaphoresis and fever.  HENT:  Positive for congestion. Negative for sore throat.   Eyes: Negative.   Respiratory:  Positive for cough. Negative for chest tightness and shortness of breath.   Cardiovascular:  Negative for chest pain.  Gastrointestinal:  Positive for abdominal pain, nausea and vomiting.  Genitourinary: Negative.  Negative for dysuria and vaginal discharge.  Musculoskeletal:  Negative for arthralgias, joint swelling and neck pain.  Skin: Negative.  Negative for rash and wound.  Neurological:  Positive for light-headedness. Negative for dizziness, weakness, numbness and headaches.  Psychiatric/Behavioral: Negative.      Physical Exam Updated Vital Signs BP 126/87 (BP Location: Right Arm)   Pulse 72   Temp 98 F (36.7 C) (Oral)   Resp (!) 21   Ht 5\' 5"  (1.651 m)   Wt  103.5 kg   LMP 08/24/2022 (Exact Date)   SpO2 98%   BMI 37.97 kg/m  Physical Exam Vitals and nursing note reviewed.  Constitutional:      Appearance: She is well-developed.  HENT:     Head: Normocephalic and atraumatic.  Eyes:     Conjunctiva/sclera: Conjunctivae normal.  Cardiovascular:     Rate and Rhythm: Normal rate and regular rhythm.     Heart sounds: Normal heart sounds.  Pulmonary:     Effort: Pulmonary effort is normal.     Breath sounds: Normal breath sounds. No wheezing.  Abdominal:      General: Bowel sounds are normal.     Palpations: Abdomen is soft.     Tenderness: There is abdominal tenderness in the right lower quadrant. There is no guarding or rebound. Positive signs include psoas sign.  Musculoskeletal:        General: Normal range of motion.     Cervical back: Normal range of motion.  Skin:    General: Skin is warm and dry.  Neurological:     Mental Status: She is alert.     ED Results / Procedures / Treatments   Labs (all labs ordered are listed, but only abnormal results are displayed) Labs Reviewed  COMPREHENSIVE METABOLIC PANEL - Abnormal; Notable for the following components:      Result Value   Glucose, Bld 103 (*)    All other components within normal limits  CBC - Abnormal; Notable for the following components:   RBC 5.28 (*)    Hemoglobin 15.6 (*)    All other components within normal limits  URINALYSIS, ROUTINE W REFLEX MICROSCOPIC - Abnormal; Notable for the following components:   APPearance HAZY (*)    All other components within normal limits  RESP PANEL BY RT-PCR (RSV, FLU A&B, COVID)  RVPGX2  LIPASE, BLOOD  POC URINE PREG, ED    EKG None  Radiology CT ABDOMEN PELVIS W CONTRAST  Result Date: 09/04/2022 CLINICAL DATA:  Right lower quadrant pain with nausea, vomiting and diarrhea. Appendicitis suspected. EXAM: CT ABDOMEN AND PELVIS WITH CONTRAST TECHNIQUE: Multidetector CT imaging of the abdomen and pelvis was performed using the standard protocol following bolus administration of intravenous contrast. RADIATION DOSE REDUCTION: This exam was performed according to the departmental dose-optimization program which includes automated exposure control, adjustment of the mA and/or kV according to patient size and/or use of iterative reconstruction technique. CONTRAST:  100 mL Omnipaque 300 COMPARISON:  None Available. FINDINGS: Lower chest: No acute abnormality. Small calcified subpleural granuloma along the central inferior left lower  lobe. This is considered benign and requires no follow-up. Hepatobiliary: No focal liver abnormality is seen. Status post cholecystectomy. Minimal biliary dilatation is likely postsurgical in nature. No evidence of radiopaque choledocholithiasis or pancreatic mass. Pancreas: Unremarkable. No pancreatic ductal dilatation or surrounding inflammatory changes. Spleen: Normal in size without focal abnormality. Adrenals/Urinary Tract: Adrenal glands are unremarkable. Kidneys are normal, without renal calculi, focal lesion, or hydronephrosis. Bladder is unremarkable. Stomach/Bowel: No evidence of obstruction or focal bowel wall thickening. Normal appendix in the right lower quadrant. The terminal ileum is unremarkable. Vascular/Lymphatic: No significant vascular findings are present. No enlarged abdominal or pelvic lymph nodes. Reproductive: Uterus and bilateral adnexa are unremarkable. Other: No abdominal wall hernia or abnormality. No abdominopelvic ascites. Musculoskeletal: No acute fracture or aggressive appearing lytic or blastic osseous lesion. Hemangioma present within the L3 vertebral body. IMPRESSION: 1. No acute abnormality within the abdomen or pelvis. Specifically, the appendix  is normal. 2. Postoperative changes of prior cholecystectomy with minimal biliary ductal dilatation which is likely expected. Electronically Signed   By: Jacqulynn Cadet M.D.   On: 09/04/2022 16:43    Procedures Procedures    Medications Ordered in ED Medications  loperamide (IMODIUM) capsule 4 mg (has no administration in time range)  sodium chloride 0.9 % bolus 1,000 mL (0 mLs Intravenous Stopped 09/04/22 1726)  ondansetron (ZOFRAN) injection 4 mg (4 mg Intravenous Given 09/04/22 1622)  morphine (PF) 4 MG/ML injection 4 mg (4 mg Intravenous Given 09/04/22 1622)  iohexol (OMNIPAQUE) 300 MG/ML solution 100 mL (100 mLs Intravenous Contrast Given 09/04/22 1635)    ED Course/ Medical Decision Making/ A&P                              Medical Decision Making Patient presenting with nausea vomiting and diarrhea with subjective fever, raising the concern for viral or bacterial gastroenteritis versus intra-abdominal infection, she does have pain that localizes to the right lower quadrant, concerning for possibility of acute appendicitis.  Other differential including diverticulitis, TOA although no risk factors for this and less likely given diarrhea symptom.  Labs are reassuring including a normal WBC count of 7.9, normal LFTs and lipase, normal urinalysis.  CT imaging is negative for acute intra-abdominal process, specifically no appendicitis, colitis.  She was given a p.o. challenge which she passed.  Home Zofran and Imodium, strict return precautions and/or follow-up with her PCP as needed persistent or worsening symptoms discussed.  Patient was stable at time of discharge.  Amount and/or Complexity of Data Reviewed Labs: ordered.    Details: Per above.  Labs are unremarkable.  She does have a hemoglobin of 15.6, I suspect this is secondary to dehydration.  Patient was given IV fluids while here. Radiology: ordered.  Risk Prescription drug management.           Final Clinical Impression(s) / ED Diagnoses Final diagnoses:  Viral gastroenteritis    Rx / DC Orders ED Discharge Orders          Ordered    ondansetron (ZOFRAN) 4 MG tablet  Every 6 hours        09/04/22 1812    loperamide (IMODIUM) 2 MG capsule  4 times daily PRN        09/04/22 1812              Evalee Jefferson, PA-C 09/04/22 1816    Hayden Rasmussen, MD 09/05/22 1017

## 2022-09-04 NOTE — ED Notes (Signed)
Pt given ice water and graham crackers ?

## 2022-09-05 ENCOUNTER — Telehealth: Payer: Self-pay

## 2022-09-05 NOTE — Telephone Encounter (Signed)
Transition Care Management Follow-up Telephone Call Date of discharge and from where: [facility] 09/04/2022 How have you been since you were released from the hospital? Stomach still hurts Any questions or concerns? No  Items Reviewed: Did the pt receive and understand the discharge instructions provided? Yes  Medications obtained and verified? Yes  Other? No  Any new allergies since your discharge? No  Dietary orders reviewed? Yes Do you have support at home? No   Home Care and Equipment/Supplies: Were home health services ordered? not applicable If so, what is the name of the agency? N/a  Has the agency set up a time to come to the patient's home? not applicable Were any new equipment or medical supplies ordered?  No What is the name of the medical supply agency? N/a Were you able to get the supplies/equipment? not applicable Do you have any questions related to the use of the equipment or supplies? No  Functional Questionnaire: (I = Independent and D = Dependent) ADLs: I  Bathing/Dressing- I  Meal Prep- I  Eating- I  Maintaining continence- I  Transferring/Ambulation- I  Managing Meds- I  Follow up appointments reviewed:  PCP Hospital f/u appt confirmed? No  no avail appt times. Sent message to staff to schedule Specialist Hospital f/u appt confirmed? No   Are transportation arrangements needed? No  If their condition worsens, is the pt aware to call PCP or go to the Emergency Dept.? Yes Was the patient provided with contact information for the PCP's office or ED? Yes Was to pt encouraged to call back with questions or concerns? Yes Juanda Crumble, LPN [facility] Direct Dial [phone]

## 2022-09-10 ENCOUNTER — Ambulatory Visit (INDEPENDENT_AMBULATORY_CARE_PROVIDER_SITE_OTHER): Payer: Medicaid Other | Admitting: Family Medicine

## 2022-09-10 ENCOUNTER — Encounter: Payer: Self-pay | Admitting: Family Medicine

## 2022-09-10 VITALS — BP 117/80 | HR 82 | Ht 66.0 in | Wt 227.1 lb

## 2022-09-10 DIAGNOSIS — A084 Viral intestinal infection, unspecified: Secondary | ICD-10-CM | POA: Insufficient documentation

## 2022-09-10 DIAGNOSIS — R059 Cough, unspecified: Secondary | ICD-10-CM | POA: Insufficient documentation

## 2022-09-10 MED ORDER — GUAIFENESIN 100 MG/5ML PO LIQD: 5.0000 mL | ORAL | 0 refills | Status: DC | PRN

## 2022-09-10 NOTE — Progress Notes (Addendum)
Established Patient Office Visit  Subjective:  Patient ID: Julie Case, female    DOB: 1991-07-26  Age: 32 y.o. MRN: [id number]  CC:  Chief Complaint  Patient presents with   Follow-up    ED f/u from 09/04/2022, pt reports feeling, a little better but stomach is still hurting a little, also reports anxiety flare from this has been having panic attacks for 3 days now. Since 09/07/22.    HPI Julie Case is a 32 y.o. female with past medical history of acute upper respiratory infection, diarrhea, and depression presents for ED on 09/04/22. For the details of today's visit, please refer to the assessment and plan.     Past Medical History:  Diagnosis Date   Depression    Epilepsy ([place])     Past Surgical History:  Procedure Laterality Date   CHOLECYSTECTOMY     ECTOPIC PREGNANCY SURGERY     with right tube removal    Family History  Problem Relation Age of Onset   Hypertension Other    Diabetes Other     Social History   Socioeconomic History   Marital status: Single    Spouse name: Not on file   Number of children: Not on file   Years of education: Not on file   Highest education level: Not on file  Occupational History   Not on file  Tobacco Use   Smoking status: Every Day    Packs/day: 0.50    Types: Cigarettes   Smokeless tobacco: Never   Tobacco comments:    1/2 pack a day  Vaping Use   Vaping Use: Never used  Substance and Sexual Activity   Alcohol use: Not Currently   Drug use: Not Currently   Sexual activity: Not Currently    Birth control/protection: None  Other Topics Concern   Not on file  Social History Narrative   Not on file   Social Determinants of Health   Financial Resource Strain: Not on file  Food Insecurity: Not on file  Transportation Needs: Not on file  Physical Activity: Not on file  Stress: Not on file  Social Connections: Not on file  Intimate Partner Violence: Not on file    Outpatient Medications Prior to Visit  Medication  Sig Dispense Refill   diclofenac Sodium (VOLTAREN) 1 % GEL Apply 2 g topically 4 (four) times daily. 2 g 0   ondansetron (ZOFRAN) 4 MG tablet Take 1 tablet (4 mg total) by mouth every 6 (six) hours. 12 tablet 0   PARoxetine (PAXIL) 30 MG tablet Take 1 tablet (30 mg total) by mouth daily. 30 tablet 2   predniSONE (DELTASONE) 20 MG tablet Take 2 tablets (40 mg total) by mouth daily with breakfast. For the next four days 8 tablet 0   loperamide (IMODIUM) 2 MG capsule Take 1 capsule (2 mg total) by mouth 4 (four) times daily as needed for diarrhea or loose stools. 12 capsule 0   tiZANidine (ZANAFLEX) 4 MG tablet Take 1 tablet (4 mg total) by mouth every 6 (six) hours as needed for muscle spasms. 30 tablet 0   norethindrone (MICRONOR) 0.35 MG tablet Take 1 tablet (0.35 mg total) by mouth daily. 90 tablet 4   oxyCODONE-acetaminophen (PERCOCET/ROXICET) 5-325 MG tablet Take 1-2 tablets by mouth every 6 (six) hours as needed. 30 tablet 0   No facility-administered medications prior to visit.    Allergies  Allergen Reactions   Penicillins Shortness Of Breath and Rash   Naproxen Nausea  And Vomiting    ROS Review of Systems  Constitutional:  Negative for chills and fever.  Eyes:  Negative for visual disturbance.  Respiratory:  Positive for cough. Negative for chest tightness and shortness of breath.   Neurological:  Negative for dizziness and headaches.  Psychiatric/Behavioral:  Negative for suicidal ideas.       Objective:    Physical Exam HENT:     Head: Normocephalic.     Mouth/Throat:     Mouth: Mucous membranes are moist.  Cardiovascular:     Rate and Rhythm: Normal rate.     Heart sounds: Normal heart sounds.  Pulmonary:     Effort: Pulmonary effort is normal.     Breath sounds: Normal breath sounds.  Neurological:     Mental Status: She is alert.     BP 117/80   Pulse 82   Ht 5' 6"$  (1.676 m)   Wt 227 lb 1.3 oz (103 kg)   LMP 08/24/2022 (Exact Date)   SpO2 93%    Breastfeeding No   BMI 36.65 kg/m  Wt Readings from Last 3 Encounters:  09/10/22 227 lb 1.3 oz (103 kg)  09/04/22 228 lb 2.8 oz (103.5 kg)  08/06/22 228 lb 1.3 oz (103.5 kg)    Lab Results  Component Value Date   TSH 3.760 07/17/2022   Lab Results  Component Value Date   WBC 7.9 09/04/2022   HGB 15.6 (H) 09/04/2022   HCT 45.3 09/04/2022   MCV 85.8 09/04/2022   PLT 268 09/04/2022   Lab Results  Component Value Date   NA 137 09/04/2022   K 3.9 09/04/2022   CO2 23 09/04/2022   GLUCOSE 103 (H) 09/04/2022   BUN 6 09/04/2022   CREATININE 0.57 09/04/2022   BILITOT 0.9 09/04/2022   ALKPHOS 87 09/04/2022   AST 17 09/04/2022   ALT 20 09/04/2022   PROT 7.6 09/04/2022   ALBUMIN 4.3 09/04/2022   CALCIUM 9.3 09/04/2022   ANIONGAP 10 09/04/2022   EGFR 119 07/17/2022   Lab Results  Component Value Date   CHOL 202 (H) 07/17/2022   Lab Results  Component Value Date   HDL 40 07/17/2022   Lab Results  Component Value Date   LDLCALC 137 (H) 07/17/2022   Lab Results  Component Value Date   TRIG 139 07/17/2022   Lab Results  Component Value Date   CHOLHDL 5.1 (H) 07/17/2022   Lab Results  Component Value Date   HGBA1C 5.1 07/17/2022      Assessment & Plan:  Viral gastroenteritis Assessment & Plan: Labs and imaging studies reviewed She reports feeling better since discharge from the ED She reports right lower quadrant abdominal pain that is relieved after having a bowel movement She denies fever, chills, and nausea She reports taking Imodium and Zofran as prescribed She reports having panic attacks the last 3 days Denies suicidal thoughts and ideation Declines benzodiazepines Encouraged to continue taking Paxil 30 mg daily as prescribed    Cough in adult Assessment & Plan: Robitussin ordered  Orders: -     guaiFENesin; Take 5 mLs by mouth every 4 (four) hours as needed for cough or to loosen phlegm.  Dispense: 120 mL; Refill: 0    Follow-up: Return in  about 1 month (around 10/12/2022).   Alvira Monday, FNP

## 2022-09-10 NOTE — Patient Instructions (Addendum)
I appreciate the opportunity to provide care to you today!    Follow up:  10/12/2022  Take medication as prescribed. Increase fluids and allow for plenty of rest. Recommend Tylenol or ibuprofen as needed for pain, fever, or general discomfort. Recommend using a humidifier at bedtime during sleep to help with cough and nasal congestion. Follow-up if your symptoms do not improve     Please continue to a heart-healthy diet and increase your physical activities. Try to exercise for 45mns at least five times a week.      It was a pleasure to see you and I look forward to continuing to work together on your health and well-being. Please do not hesitate to call the office if you need care or have questions about your care.   Have a wonderful day and week. With Gratitude, GAlvira MondayMSN, FNP-BC

## 2022-09-10 NOTE — Assessment & Plan Note (Signed)
Robitussin ordered

## 2022-09-10 NOTE — Assessment & Plan Note (Addendum)
Labs and imaging studies reviewed She reports feeling better since discharge from the ED She reports right lower quadrant abdominal pain that is relieved after having a bowel movement She denies fever, chills, and nausea She reports taking Imodium and Zofran as prescribed She reports having panic attacks the last 3 days Denies suicidal thoughts and ideation Declines benzodiazepines Encouraged to continue taking Paxil 30 mg daily as prescribed

## 2022-09-19 ENCOUNTER — Ambulatory Visit (HOSPITAL_COMMUNITY)
Admission: RE | Admit: 2022-09-19 | Discharge: 2022-09-19 | Disposition: A | Discharge status: Home / Self Care | Payer: Medicaid Other | Source: Ambulatory Visit | Attending: Internal Medicine | Admitting: Internal Medicine

## 2022-09-19 ENCOUNTER — Encounter: Payer: Self-pay | Admitting: Internal Medicine

## 2022-09-19 ENCOUNTER — Ambulatory Visit (INDEPENDENT_AMBULATORY_CARE_PROVIDER_SITE_OTHER): Payer: Medicaid Other | Admitting: Internal Medicine

## 2022-09-19 VITALS — BP 118/89 | HR 125 | Ht 65.0 in | Wt 227.0 lb

## 2022-09-19 DIAGNOSIS — S99911A Unspecified injury of right ankle, initial encounter: Secondary | ICD-10-CM

## 2022-09-19 DIAGNOSIS — W19XXXA Unspecified fall, initial encounter: Secondary | ICD-10-CM | POA: Diagnosis not present

## 2022-09-19 DIAGNOSIS — M25571 Pain in right ankle and joints of right foot: Secondary | ICD-10-CM | POA: Diagnosis not present

## 2022-09-19 MED ORDER — MELOXICAM 7.5 MG PO TABS: 7.5000 mg | ORAL_TABLET | Freq: Every day | ORAL | 0 refills | Status: DC

## 2022-09-19 NOTE — Assessment & Plan Note (Addendum)
Mechanical fall while coming down the stairs Has acute onset right ankle pain and swelling Check x-ray of right ankle and foot Meloxicam as needed for pain Advised to apply ice and ankle brace Leg elevation as tolerated Urgent referral to orthopedic surgery

## 2022-09-19 NOTE — Patient Instructions (Signed)
Please take Mobic as needed for pain.  Please get X-ray of ankle and foot done at [facility].  Please apply ice and perform leg elevation as tolerated.  Okay to apply ankle brace for swelling.

## 2022-09-19 NOTE — Progress Notes (Signed)
Acute Office Visit  Subjective:    Patient ID: Julie Case, female    DOB: Jun 01, 1991, 32 y.o.   MRN: [id number]  Chief Complaint  Patient presents with   Fall    Patient fell today, now complaining of ankle pain with swelling     HPI Patient is in today for evaluation of the right ankle and foot pain.  She had a mechanical fall at home while coming down the stairs, and fell from the last 2 stairs.  She states that she landed on her right ankle, but does not remember whether she had medial or lateral twisting of the ankle.  She has had acute onset ankle pain with lateral malleolar area swelling.  Her pain is constant, sharp, radiating towards the toes and worse upon walking.  Denies any head injury.  Denies LOC or shaking.  Denies any prodromal symptoms before the fall.  Past Medical History:  Diagnosis Date   Depression    Epilepsy ([place])     Past Surgical History:  Procedure Laterality Date   CHOLECYSTECTOMY     ECTOPIC PREGNANCY SURGERY     with right tube removal    Family History  Problem Relation Age of Onset   Hypertension Other    Diabetes Other     Social History   Socioeconomic History   Marital status: Single    Spouse name: Not on file   Number of children: Not on file   Years of education: Not on file   Highest education level: Not on file  Occupational History   Not on file  Tobacco Use   Smoking status: Every Day    Packs/day: 0.50    Types: Cigarettes   Smokeless tobacco: Never   Tobacco comments:    1/2 pack a day  Vaping Use   Vaping Use: Never used  Substance and Sexual Activity   Alcohol use: Not Currently   Drug use: Not Currently   Sexual activity: Not Currently    Birth control/protection: None  Other Topics Concern   Not on file  Social History Narrative   Not on file   Social Determinants of Health   Financial Resource Strain: Not on file  Food Insecurity: Not on file  Transportation Needs: Not on file  Physical Activity: Not  on file  Stress: Not on file  Social Connections: Not on file  Intimate Partner Violence: Not on file    Outpatient Medications Prior to Visit  Medication Sig Dispense Refill   diclofenac Sodium (VOLTAREN) 1 % GEL Apply 2 g topically 4 (four) times daily. 2 g 0   guaiFENesin (ROBITUSSIN) 100 MG/5ML liquid Take 5 mLs by mouth every 4 (four) hours as needed for cough or to loosen phlegm. 120 mL 0   norethindrone (MICRONOR) 0.35 MG tablet Take 1 tablet (0.35 mg total) by mouth daily. 90 tablet 4   ondansetron (ZOFRAN) 4 MG tablet Take 1 tablet (4 mg total) by mouth every 6 (six) hours. 12 tablet 0   PARoxetine (PAXIL) 30 MG tablet Take 1 tablet (30 mg total) by mouth daily. 30 tablet 2   predniSONE (DELTASONE) 20 MG tablet Take 2 tablets (40 mg total) by mouth daily with breakfast. For the next four days 8 tablet 0   No facility-administered medications prior to visit.    Allergies  Allergen Reactions   Penicillins Shortness Of Breath and Rash   Naproxen Nausea And Vomiting    Review of Systems  Constitutional:  Negative  for chills and fever.  Respiratory:  Negative for cough and shortness of breath.   Cardiovascular:  Negative for chest pain and palpitations.  Genitourinary:  Negative for dysuria and hematuria.  Musculoskeletal:  Positive for arthralgias (Right ankle and foot) and back pain.  Skin:  Negative for rash.  Neurological:  Negative for dizziness and weakness.  Psychiatric/Behavioral:  Negative for agitation and behavioral problems.        Objective:    Physical Exam Constitutional:      General: She is not in acute distress.    Appearance: She is obese. She is not diaphoretic.  HENT:     Head: Normocephalic and atraumatic.  Eyes:     General: No scleral icterus.    Extraocular Movements: Extraocular movements intact.  Cardiovascular:     Heart sounds: Normal heart sounds. No murmur heard. Pulmonary:     Breath sounds: Normal breath sounds. No wheezing or  rales.  Musculoskeletal:     Right ankle: Swelling present. Tenderness present over the lateral malleolus. Decreased range of motion. Normal pulse.  Neurological:     General: No focal deficit present.     Mental Status: She is alert and oriented to person, place, and time.  Psychiatric:        Mood and Affect: Mood normal.        Behavior: Behavior normal.     BP 118/89 (BP Location: Right Arm, Patient Position: Sitting, Cuff Size: Large)   Pulse (!) 125   Ht 5' 5"$  (1.651 m)   Wt 227 lb (103 kg)   LMP 08/24/2022 (Exact Date)   SpO2 96%   BMI 37.77 kg/m  Wt Readings from Last 3 Encounters:  09/19/22 227 lb (103 kg)  09/10/22 227 lb 1.3 oz (103 kg)  09/04/22 228 lb 2.8 oz (103.5 kg)        Assessment & Plan:   Problem List Items Addressed This Visit       Other   Fall - Primary    Mechanical fall while coming down the stairs Has acute onset right ankle pain and swelling Check x-ray of right ankle and foot Meloxicam as needed for pain Advised to apply ice and ankle brace Leg elevation as tolerated Urgent referral to orthopedic surgery      Relevant Medications   meloxicam (MOBIC) 7.5 MG tablet   Other Relevant Orders   DG Ankle Complete Right   DG Foot Complete Right   Ambulatory referral to Orthopedic Surgery   Other Visit Diagnoses     Injury of right ankle, initial encounter       Relevant Medications   meloxicam (MOBIC) 7.5 MG tablet   Other Relevant Orders   Ambulatory referral to Orthopedic Surgery        Meds ordered this encounter  Medications   meloxicam (MOBIC) 7.5 MG tablet    Sig: Take 1 tablet (7.5 mg total) by mouth daily.    Dispense:  30 tablet    Refill:  0     Sonjia Wilcoxson Keith Rake, MD

## 2022-09-20 ENCOUNTER — Other Ambulatory Visit: Payer: Self-pay | Admitting: Family Medicine

## 2022-09-20 ENCOUNTER — Other Ambulatory Visit: Payer: Self-pay

## 2022-09-20 ENCOUNTER — Telehealth: Payer: Self-pay | Admitting: Family Medicine

## 2022-09-20 ENCOUNTER — Ambulatory Visit (INDEPENDENT_AMBULATORY_CARE_PROVIDER_SITE_OTHER): Payer: Medicaid Other

## 2022-09-20 DIAGNOSIS — S99911A Unspecified injury of right ankle, initial encounter: Secondary | ICD-10-CM | POA: Diagnosis not present

## 2022-09-20 MED ORDER — KETOROLAC TROMETHAMINE 60 MG/2ML IM SOLN
60.0000 mg | Freq: Once | INTRAMUSCULAR | Status: AC
  Administered 2022-09-20: 60 mg via INTRAMUSCULAR

## 2022-09-20 NOTE — Telephone Encounter (Signed)
Spoke with patient.

## 2022-10-12 ENCOUNTER — Encounter: Payer: Self-pay | Admitting: Family Medicine

## 2022-10-12 ENCOUNTER — Ambulatory Visit: Payer: Medicaid Other | Admitting: Family Medicine

## 2022-10-16 ENCOUNTER — Other Ambulatory Visit: Payer: Self-pay | Admitting: Internal Medicine

## 2022-10-16 DIAGNOSIS — W19XXXA Unspecified fall, initial encounter: Secondary | ICD-10-CM

## 2022-10-16 DIAGNOSIS — S99911A Unspecified injury of right ankle, initial encounter: Secondary | ICD-10-CM

## 2022-10-19 ENCOUNTER — Encounter: Payer: Self-pay | Admitting: Family Medicine

## 2022-10-19 ENCOUNTER — Ambulatory Visit (INDEPENDENT_AMBULATORY_CARE_PROVIDER_SITE_OTHER): Payer: Medicaid Other | Admitting: Family Medicine

## 2022-10-19 VITALS — BP 118/72 | HR 98 | Ht 65.0 in | Wt 227.0 lb

## 2022-10-19 DIAGNOSIS — W19XXXD Unspecified fall, subsequent encounter: Secondary | ICD-10-CM

## 2022-10-19 DIAGNOSIS — R197 Diarrhea, unspecified: Secondary | ICD-10-CM

## 2022-10-19 DIAGNOSIS — E559 Vitamin D deficiency, unspecified: Secondary | ICD-10-CM | POA: Diagnosis not present

## 2022-10-19 DIAGNOSIS — E669 Obesity, unspecified: Secondary | ICD-10-CM | POA: Insufficient documentation

## 2022-10-19 DIAGNOSIS — R7301 Impaired fasting glucose: Secondary | ICD-10-CM

## 2022-10-19 DIAGNOSIS — E038 Other specified hypothyroidism: Secondary | ICD-10-CM

## 2022-10-19 DIAGNOSIS — R0981 Nasal congestion: Secondary | ICD-10-CM | POA: Insufficient documentation

## 2022-10-19 DIAGNOSIS — E7849 Other hyperlipidemia: Secondary | ICD-10-CM | POA: Diagnosis not present

## 2022-10-19 MED ORDER — FLUTICASONE PROPIONATE 50 MCG/ACT NA SUSP: 2.0000 | Freq: Every day | NASAL | 2 refills | Status: DC

## 2022-10-19 NOTE — Assessment & Plan Note (Signed)
Patient would like to discuss weight loss options today She reports that her weight fluctuates She has been walking and decreasing her intake of sodas and processed foods Encouraged the patient to implement lifestyle changes for 3 months Review my weight management plan with the patient Patient verbalized understanding Wt Readings from Last 3 Encounters:  10/19/22 227 lb 0.6 oz (103 kg)  09/19/22 227 lb (103 kg)  09/10/22 227 lb 1.3 oz (103 kg)

## 2022-10-19 NOTE — Assessment & Plan Note (Signed)
Complains of nasal congestion and postnasal drip Denies sore throat, fever, chills, shortness of breath, body aches, sinus pain and pressure Will treat with Flonase Encouraged to take over-the-counter Zyrtec

## 2022-10-19 NOTE — Progress Notes (Signed)
Established Patient Office Visit  Subjective:  Patient ID: Julie Case, female    DOB: 09/23/1990  Age: 32 y.o. MRN: [id number]  CC:  Chief Complaint  Patient presents with   Follow-up    F/u would like to discuss weight loss options, also reports sinus problem going on since 10/18/22.    HPI Julie Case is a 32 y.o. female with past medical history of diarrhea and fall presents for f/u.  Past Medical History:  Diagnosis Date   Depression    Epilepsy ([employer])     Past Surgical History:  Procedure Laterality Date   CHOLECYSTECTOMY     ECTOPIC PREGNANCY SURGERY     with right tube removal    Family History  Problem Relation Age of Onset   Hypertension Other    Diabetes Other     Social History   Socioeconomic History   Marital status: Single    Spouse name: Not on file   Number of children: Not on file   Years of education: Not on file   Highest education level: Not on file  Occupational History   Not on file  Tobacco Use   Smoking status: Every Day    Packs/day: .5    Types: Cigarettes   Smokeless tobacco: Never   Tobacco comments:    1/2 pack a day  Vaping Use   Vaping Use: Never used  Substance and Sexual Activity   Alcohol use: Not Currently   Drug use: Not Currently   Sexual activity: Not Currently    Birth control/protection: None  Other Topics Concern   Not on file  Social History Narrative   Not on file   Social Determinants of Health   Financial Resource Strain: Not on file  Food Insecurity: Not on file  Transportation Needs: Not on file  Physical Activity: Not on file  Stress: Not on file  Social Connections: Not on file  Intimate Partner Violence: Not on file    Outpatient Medications Prior to Visit  Medication Sig Dispense Refill   diclofenac Sodium (VOLTAREN) 1 % GEL Apply 2 g topically 4 (four) times daily. 2 g 0   ondansetron (ZOFRAN) 4 MG tablet Take 1 tablet (4 mg total) by mouth every 6 (six) hours. 12 tablet 0   PARoxetine  (PAXIL) 30 MG tablet Take 1 tablet (30 mg total) by mouth daily. 30 tablet 2   guaiFENesin (ROBITUSSIN) 100 MG/5ML liquid Take 5 mLs by mouth every 4 (four) hours as needed for cough or to loosen phlegm. 120 mL 0   meloxicam (MOBIC) 7.5 MG tablet TAKE 1 TABLET(7.5 MG) BY MOUTH DAILY (Patient not taking: Reported on 10/19/2022) 30 tablet 0   norethindrone (MICRONOR) 0.35 MG tablet Take 1 tablet (0.35 mg total) by mouth daily. 90 tablet 4   No facility-administered medications prior to visit.    Allergies  Allergen Reactions   Penicillins Shortness Of Breath and Rash   Naproxen Nausea And Vomiting    ROS Review of Systems  Constitutional:  Negative for chills and fever.  HENT:  Positive for congestion and rhinorrhea. Negative for sinus pressure, sinus pain, sneezing and sore throat.   Respiratory:  Negative for cough and shortness of breath.   Neurological:  Negative for dizziness and facial asymmetry.      Objective:    Physical Exam HENT:     Head: Normocephalic.     Mouth/Throat:     Mouth: Mucous membranes are moist.  Cardiovascular:  Rate and Rhythm: Normal rate.     Heart sounds: Normal heart sounds.  Pulmonary:     Effort: Pulmonary effort is normal.     Breath sounds: Normal breath sounds.  Neurological:     Mental Status: She is alert.     BP 118/72   Pulse 98   Ht 5\' 5"  (1.651 m)   Wt 227 lb 0.6 oz (103 kg)   SpO2 99%   BMI 37.78 kg/m  Wt Readings from Last 3 Encounters:  10/19/22 227 lb 0.6 oz (103 kg)  09/19/22 227 lb (103 kg)  09/10/22 227 lb 1.3 oz (103 kg)    Lab Results  Component Value Date   TSH 3.760 07/17/2022   Lab Results  Component Value Date   WBC 7.9 09/04/2022   HGB 15.6 (H) 09/04/2022   HCT 45.3 09/04/2022   MCV 85.8 09/04/2022   PLT 268 09/04/2022   Lab Results  Component Value Date   NA 137 09/04/2022   K 3.9 09/04/2022   CO2 23 09/04/2022   GLUCOSE 103 (H) 09/04/2022   BUN 6 09/04/2022   CREATININE 0.57 09/04/2022    BILITOT 0.9 09/04/2022   ALKPHOS 87 09/04/2022   AST 17 09/04/2022   ALT 20 09/04/2022   PROT 7.6 09/04/2022   ALBUMIN 4.3 09/04/2022   CALCIUM 9.3 09/04/2022   ANIONGAP 10 09/04/2022   EGFR 119 07/17/2022   Lab Results  Component Value Date   CHOL 202 (H) 07/17/2022   Lab Results  Component Value Date   HDL 40 07/17/2022   Lab Results  Component Value Date   LDLCALC 137 (H) 07/17/2022   Lab Results  Component Value Date   TRIG 139 07/17/2022   Lab Results  Component Value Date   CHOLHDL 5.1 (H) 07/17/2022   Lab Results  Component Value Date   HGBA1C 5.1 07/17/2022      Assessment & Plan:  Obesity (BMI 35.0-39.9 without comorbidity) Assessment & Plan: Patient would like to discuss weight loss options today She reports that her weight fluctuates She has been walking and decreasing her intake of sodas and processed foods Encouraged the patient to implement lifestyle changes for 3 months Review my weight management plan with the patient Patient verbalized understanding Wt Readings from Last 3 Encounters:  10/19/22 227 lb 0.6 oz (103 kg)  09/19/22 227 lb (103 kg)  09/10/22 227 lb 1.3 oz (103 kg)      Nasal congestion Assessment & Plan: Complains of nasal congestion and postnasal drip Denies sore throat, fever, chills, shortness of breath, body aches, sinus pain and pressure Will treat with Flonase Encouraged to take over-the-counter Zyrtec   Orders: -     Fluticasone Propionate; Place 2 sprays into both nostrils daily.  Dispense: 16 g; Refill: 2  Fall, subsequent encounter Assessment & Plan: She has not yet follow-up with orthopedics She reports not taking meloxicam, noting that she has allergies to NSAIDs Reports swelling with prolonged ambulation of the affected leg Encouraged avoiding activities that increased swelling Encouraged to take Tylenol as needed and use her ankle brace Encouraged to follow-up on the referral placed to orthopedic  surgery   IFG (impaired fasting glucose) -     Hemoglobin A1c  Diarrhea, unspecified type Assessment & Plan: Resolved   Vitamin D deficiency -     VITAMIN D 25 Hydroxy (Vit-D Deficiency, Fractures)  Other specified hypothyroidism -     TSH + free T4  Other hyperlipidemia -  Lipid panel    Follow-up: Return in about 1 month (around 11/19/2022).   Alvira Monday, FNP

## 2022-10-19 NOTE — Assessment & Plan Note (Signed)
Resolved

## 2022-10-19 NOTE — Assessment & Plan Note (Signed)
She has not yet follow-up with orthopedics She reports not taking meloxicam, noting that she has allergies to NSAIDs Reports swelling with prolonged ambulation of the affected leg Encouraged avoiding activities that increased swelling Encouraged to take Tylenol as needed and use her ankle brace Encouraged to follow-up on the referral placed to orthopedic surgery

## 2022-10-19 NOTE — Patient Instructions (Signed)
I appreciate the opportunity to provide care to you today!    Follow up:  1 month  Labs: please stop by the lab during the week to get your blood drawn (TSH, Lipid profile, HgA1c, Vit D)    Please continue to a heart-healthy diet and increase your physical activities. Try to exercise for 18mins at least five days a week.      It was a pleasure to see you and I look forward to continuing to work together on your health and well-being. Please do not hesitate to call the office if you need care or have questions about your care.   Have a wonderful day and week. With Gratitude, Alvira Monday MSN, FNP-BC

## 2022-10-23 DIAGNOSIS — R7301 Impaired fasting glucose: Secondary | ICD-10-CM | POA: Diagnosis not present

## 2022-10-23 DIAGNOSIS — E038 Other specified hypothyroidism: Secondary | ICD-10-CM | POA: Diagnosis not present

## 2022-10-23 DIAGNOSIS — E7849 Other hyperlipidemia: Secondary | ICD-10-CM | POA: Diagnosis not present

## 2022-10-23 DIAGNOSIS — E559 Vitamin D deficiency, unspecified: Secondary | ICD-10-CM | POA: Diagnosis not present

## 2022-10-24 LAB — LIPID PANEL
Chol/HDL Ratio: 5.7 ratio — ABNORMAL HIGH (ref 0.0–4.4)
Cholesterol, Total: 193 mg/dL (ref 100–199)
HDL: 34 mg/dL — ABNORMAL LOW (ref >39)
LDL Chol Calc (NIH): 136 mg/dL — ABNORMAL HIGH (ref 0–99)
Triglycerides: 126 mg/dL (ref 0–149)
VLDL Cholesterol Cal: 23 mg/dL (ref 5–40)

## 2022-10-24 LAB — TSH+FREE T4
Free T4: 1.07 ng/dL (ref 0.82–1.77)
TSH: 2 u[IU]/mL (ref 0.450–4.500)

## 2022-10-24 LAB — HEMOGLOBIN A1C
Est. average glucose Bld gHb Est-mCnc: 103 mg/dL
Hgb A1c MFr Bld: 5.2 % (ref 4.8–5.6)

## 2022-10-24 LAB — VITAMIN D 25 HYDROXY (VIT D DEFICIENCY, FRACTURES): Vit D, 25-Hydroxy: 17.5 ng/mL — ABNORMAL LOW (ref 30.0–100.0)

## 2022-10-29 ENCOUNTER — Other Ambulatory Visit: Payer: Self-pay | Admitting: Family Medicine

## 2022-10-29 DIAGNOSIS — E559 Vitamin D deficiency, unspecified: Secondary | ICD-10-CM

## 2022-10-29 MED ORDER — VITAMIN D (ERGOCALCIFEROL) 1.25 MG (50000 UNIT) PO CAPS: 50000.0000 [IU] | ORAL_CAPSULE | ORAL | 1 refills | Status: DC

## 2022-10-29 NOTE — Progress Notes (Unsigned)
The ASCVD Risk score (Arnett DK, et al., 2019) failed to calculate for the following reasons:    The 2019 ASCVD risk score is only valid for ages 40 to 79

## 2022-10-31 ENCOUNTER — Ambulatory Visit (INDEPENDENT_AMBULATORY_CARE_PROVIDER_SITE_OTHER): Payer: Medicaid Other | Admitting: Orthopedic Surgery

## 2022-10-31 ENCOUNTER — Encounter: Payer: Self-pay | Admitting: Orthopedic Surgery

## 2022-10-31 VITALS — BP 134/87 | HR 87 | Ht 65.0 in | Wt 227.0 lb

## 2022-10-31 DIAGNOSIS — M25571 Pain in right ankle and joints of right foot: Secondary | ICD-10-CM

## 2022-10-31 NOTE — Patient Instructions (Signed)
Instructions ° °1.  You have sustained an ankle sprain, or similar exercises that can be treated as an ankle sprain.  **These exercises can also be used as part of recovery from an ankle fracture.  °2.  I encourage you to stay on your feet and gradually remove your walking boot.   °3.  Below are some exercises that you can complete on your own to improve your symptoms.  °4.  As an alternative, you can search for ankle sprain exercises online, and can see some demonstrations on YouTube  °5.  If you are having difficulty with these exercises, we can also prescribe formal physical therapy ° °Ankle Exercises °Ask your health care provider which exercises are safe for you. Do exercises exactly as told by your health care provider and adjust them as directed. It is normal to feel mild stretching, pulling, tightness, or mild discomfort as you do these exercises. Stop right away if you feel sudden pain or your pain gets worse. Do not begin these exercises until told by your health care provider. ° °Stretching and range-of-motion exercises °These exercises warm up your muscles and joints and improve the movement and flexibility of your ankle. These exercises may also help to relieve pain. ° °Dorsiflexion/plantar flexion ° °Sit with your R knee straight or bent. Do not rest your foot on anything. °Flex your left ankle to tilt the top of your foot toward your shin. This is called dorsiflexion. °Hold this position for 5 seconds. °Point your toes downward to tilt the top of your foot away from your shin. This is called plantar flexion. °Hold this position for 5 seconds. °Repeat 10 times. Complete this exercise 2-3 times a day.  As tolerated ° °Ankle alphabet ° °Sit with your R foot supported at your lower leg. °Do not rest your foot on anything. °Make sure your foot has room to move freely. °Think of your R foot as a paintbrush: °Move your foot to trace each letter of the alphabet in the air. Keep your hip and knee still while  you trace the letters. Trace every letter from A to Z. °Make the letters as large as you can without causing or increasing any discomfort. ° °Repeat 2-3 times. Complete this exercise 2-3 times a day. ° ° °Strengthening exercises °These exercises build strength and endurance in your ankle. Endurance is the ability to use your muscles for a long time, even after they get tired. °Dorsiflexors °These are muscles that lift your foot up. °Secure a rubber exercise band or tube to an object, such as a table leg, that will stay still when the band is pulled. Secure the other end around your R foot. °Sit on the floor, facing the object with your R leg extended. The band or tube should be slightly tense when your foot is relaxed. °Slowly flex your R ankle and toes to bring your foot toward your shin. °Hold this position for 5 seconds. °Slowly return your foot to the starting position, controlling the band as you do that. °Repeat 10 times. Complete this exercise 2-3 times a day. ° °Plantar flexors °These are muscles that push your foot down. °Sit on the floor with your R leg extended. °Loop a rubber exercise band or tube around the ball of your R foot. The ball of your foot is on the walking surface, right under your toes. The band or tube should be slightly tense when your foot is relaxed. °Slowly point your toes downward, pushing them away from   you. °Hold this position for 5 seconds. °Slowly release the tension in the band or tube, controlling smoothly until your foot is back in the starting position. °Repeat 10 times. Complete this exercise 2-3 times a day. ° °Towel curls ° °Sit in a chair on a non-carpeted surface, and put your feet on the floor. °Place a towel in front of your feet. °Keeping your heel on the floor, put your R foot on the towel. °Pull the towel toward you by grabbing the towel with your toes and curling them under. Keep your heel on the floor. °Let your toes relax. °Grab the towel again. Keep pulling the  towel until it is completely underneath your foot. °Repeat 10 times. Complete this exercise 2-3 times a day. ° °Standing plantar flexion °This is an exercise in which you use your toes to lift your body's weight while standing. °Stand with your feet shoulder-width apart. °Keep your weight spread evenly over the width of your feet while you rise up on your toes. Use a wall or table to steady yourself if needed, but try not to use it for support. °If this exercise is too easy, try these options: °Shift your weight toward your R leg until you feel challenged. °If told by your health care provider, lift your uninjured leg off the floor. °Hold this position for 5 seconds. °Repeat 10 times. Complete this exercise 2-3 times a day. ° °Tandem walking °Stand with one foot directly in front of the other. °Slowly raise your back foot up, lifting your heel before your toes, and place it directly in front of your other foot. °Continue to walk in this heel-to-toe way. Have a countertop or wall nearby to use if needed to keep your balance, but try not to hold onto anything for support. ° °Repeat 10 times. Complete this exercise 2-3 times a day. ° ° °Document Revised: 04/12/2018 Document Reviewed: 04/14/2018 °Elsevier Patient Education © 2020 [employer] ° °

## 2022-11-02 NOTE — Progress Notes (Signed)
New Patient Visit  Assessment: Julie Case is a 32 y.o. female with the following: Right ankle pain  Plan: [name] fell, and hit her ankle approximately 6 weeks ago.  She continues to have pain and swelling.  Radiographs are negative.  No swelling on physical exam today.  Recommended she start working on exercises, to improve her range of motion and strengthen her ankle.  She states her understanding.  Provided her with home exercises for her to initiate.  Neck step would be to consider formal physical therapy.  She will follow-up as needed.  Follow-up: Return if symptoms worsen or fail to improve.  Subjective:  Chief Complaint  Patient presents with   Ankle Pain    R ankle pain since injury DOI 09/20/22. States still having pain and swelling, especially after prolonged walking    History of Present Illness: Julie Case is a 32 y.o. female who has been referred by  Yvonne Kendall, FNP for evaluation of right ankle pain.  Greater than 6 weeks ago, she states she fell down some stairs, and hit her right ankle.  Since then, she continues to have pain.  She takes medications occasionally.  She has not had a brace.  No cam boot.  She has not worked with therapy.  She has not done any exercises.  No medications.  Pain is on the lateral aspect of her ankle.   Review of Systems: No fevers or chills No numbness or tingling No chest pain No shortness of breath No bowel or bladder dysfunction No GI distress No headaches   Medical History:  Past Medical History:  Diagnosis Date   Depression    Epilepsy     Past Surgical History:  Procedure Laterality Date   CHOLECYSTECTOMY     ECTOPIC PREGNANCY SURGERY     with right tube removal    Family History  Problem Relation Age of Onset   Hypertension Other    Diabetes Other    Social History   Tobacco Use   Smoking status: Every Day    Packs/day: .5    Types: Cigarettes   Smokeless tobacco: Never   Tobacco comments:    1/2  pack a day  Vaping Use   Vaping Use: Never used  Substance Use Topics   Alcohol use: Not Currently   Drug use: Not Currently    Allergies  Allergen Reactions   Penicillins Shortness Of Breath and Rash   Naproxen Nausea And Vomiting    No outpatient medications have been marked as taking for the 10/31/22 encounter (Office Visit) with Oliver Barre, MD.    Objective: BP 134/87   Pulse 87   Ht 5\' 5"  (1.651 m)   Wt 227 lb (103 kg)   BMI 37.77 kg/m   Physical Exam:  General: Alert and oriented. and No acute distress. Gait: Right sided antalgic gait.  Evaluation of the right ankle demonstrates no bruising.  No redness.  No swelling on exam today.  Mild tenderness palpation over the lateral ankle.  She tolerates gentle range of motion.  No pain with inversion or eversion.  Dorsiflexion greater than 10 degrees.  Toes warm and well-perfused.  IMAGING: I personally reviewed images previously obtained in clinic  X-rays of the right ankle were previously obtained.  These are negative for fracture.   New Medications:  No orders of the defined types were placed in this encounter.     Oliver Barre, MD  11/02/2022 11:13 AM

## 2022-11-23 ENCOUNTER — Encounter: Payer: Self-pay | Admitting: Family Medicine

## 2022-11-23 ENCOUNTER — Ambulatory Visit (INDEPENDENT_AMBULATORY_CARE_PROVIDER_SITE_OTHER): Payer: Medicaid Other | Admitting: Family Medicine

## 2022-11-23 VITALS — BP 123/85 | HR 88 | Ht 65.0 in | Wt 232.1 lb

## 2022-11-23 DIAGNOSIS — Z6838 Body mass index (BMI) 38.0-38.9, adult: Secondary | ICD-10-CM

## 2022-11-23 DIAGNOSIS — E669 Obesity, unspecified: Secondary | ICD-10-CM | POA: Diagnosis not present

## 2022-11-23 NOTE — Progress Notes (Signed)
Established Patient Office Visit  Subjective:  Patient ID: Julie Case, female    DOB: 08-31-1990  Age: 32 y.o. MRN: [id number]  CC:  Chief Complaint  Patient presents with   Obesity    Pt reports cutting out sodas only doing water, has been excercising and having trouble losing weight.    HPI Julie Case is a 32 y.o. female presents for obesity follow-up. For the details of today's visit, please refer to the assessment and plan.       Past Medical History:  Diagnosis Date   Depression    Epilepsy (HCC)     Past Surgical History:  Procedure Laterality Date   CHOLECYSTECTOMY     ECTOPIC PREGNANCY SURGERY     with right tube removal    Family History  Problem Relation Age of Onset   Hypertension Other    Diabetes Other     Social History   Socioeconomic History   Marital status: Single    Spouse name: Not on file   Number of children: Not on file   Years of education: Not on file   Highest education level: 12th grade  Occupational History   Not on file  Tobacco Use   Smoking status: Every Day    Packs/day: .5    Types: Cigarettes   Smokeless tobacco: Never   Tobacco comments:    1/2 pack a day  Vaping Use   Vaping Use: Never used  Substance and Sexual Activity   Alcohol use: Not Currently   Drug use: Not Currently   Sexual activity: Not Currently    Birth control/protection: None  Other Topics Concern   Not on file  Social History Narrative   Not on file   Social Determinants of Health   Financial Resource Strain: Low Risk  (11/19/2022)   Overall Financial Resource Strain (CARDIA)    Difficulty of Paying Living Expenses: Not very hard  Food Insecurity: No Food Insecurity (11/19/2022)   Hunger Vital Sign    Worried About Running Out of Food in the Last Year: Never true    Ran Out of Food in the Last Year: Never true  Transportation Needs: No Transportation Needs (11/19/2022)   PRAPARE - Administrator, Civil Service (Medical): No     Lack of Transportation (Non-Medical): No  Physical Activity: Insufficiently Active (11/19/2022)   Exercise Vital Sign    Days of Exercise per Week: 3 days    Minutes of Exercise per Session: 30 min  Stress: Stress Concern Present (11/19/2022)   [employer] of Occupational Health - Occupational Stress Questionnaire    Feeling of Stress : Rather much  Social Connections: Moderately Integrated (11/19/2022)   Social Connection and Isolation Panel [NHANES]    Frequency of Communication with Friends and Family: Three times a week    Frequency of Social Gatherings with Friends and Family: Once a week    Attends Religious Services: 1 to 4 times per year    Active Member of [employer] or Organizations: No    Attends Engineer, structural: Not on file    Marital Status: Living with partner  Intimate Partner Violence: Not on file    Outpatient Medications Prior to Visit  Medication Sig Dispense Refill   diclofenac Sodium (VOLTAREN) 1 % GEL Apply 2 g topically 4 (four) times daily. 2 g 0   fluticasone (FLONASE) 50 MCG/ACT nasal spray Place 2 sprays into both nostrils daily. 16 g 2   meloxicam (MOBIC)  7.5 MG tablet TAKE 1 TABLET(7.5 MG) BY MOUTH DAILY 30 tablet 0   ondansetron (ZOFRAN) 4 MG tablet Take 1 tablet (4 mg total) by mouth every 6 (six) hours. 12 tablet 0   PARoxetine (PAXIL) 30 MG tablet Take 1 tablet (30 mg total) by mouth daily. 30 tablet 2   Vitamin D, Ergocalciferol, (DRISDOL) 1.25 MG (50000 UNIT) CAPS capsule Take 1 capsule (50,000 Units total) by mouth every 7 (seven) days. 10 capsule 1   norethindrone (MICRONOR) 0.35 MG tablet Take 1 tablet (0.35 mg total) by mouth daily. 90 tablet 4   No facility-administered medications prior to visit.    Allergies  Allergen Reactions   Penicillins Shortness Of Breath and Rash   Naproxen Nausea And Vomiting    ROS Review of Systems  Constitutional:  Negative for chills and fever.  Eyes:  Negative for visual disturbance.   Respiratory:  Negative for chest tightness and shortness of breath.   Neurological:  Negative for dizziness and headaches.      Objective:    Physical Exam HENT:     Head: Normocephalic.     Mouth/Throat:     Mouth: Mucous membranes are moist.  Cardiovascular:     Rate and Rhythm: Normal rate.     Heart sounds: Normal heart sounds.  Pulmonary:     Effort: Pulmonary effort is normal.     Breath sounds: Normal breath sounds.  Neurological:     Mental Status: She is alert.     BP 123/85   Pulse 88   Ht 5\' 5"  (1.651 m)   Wt 232 lb 1.3 oz (105.3 kg)   SpO2 95%   BMI 38.62 kg/m  Wt Readings from Last 3 Encounters:  11/23/22 232 lb 1.3 oz (105.3 kg)  10/31/22 227 lb (103 kg)  10/19/22 227 lb 0.6 oz (103 kg)    Lab Results  Component Value Date   TSH 2.000 10/23/2022   Lab Results  Component Value Date   WBC 7.9 09/04/2022   HGB 15.6 (H) 09/04/2022   HCT 45.3 09/04/2022   MCV 85.8 09/04/2022   PLT 268 09/04/2022   Lab Results  Component Value Date   NA 137 09/04/2022   K 3.9 09/04/2022   CO2 23 09/04/2022   GLUCOSE 103 (H) 09/04/2022   BUN 6 09/04/2022   CREATININE 0.57 09/04/2022   BILITOT 0.9 09/04/2022   ALKPHOS 87 09/04/2022   AST 17 09/04/2022   ALT 20 09/04/2022   PROT 7.6 09/04/2022   ALBUMIN 4.3 09/04/2022   CALCIUM 9.3 09/04/2022   ANIONGAP 10 09/04/2022   EGFR 119 07/17/2022   Lab Results  Component Value Date   CHOL 193 10/23/2022   Lab Results  Component Value Date   HDL 34 (L) 10/23/2022   Lab Results  Component Value Date   LDLCALC 136 (H) 10/23/2022   Lab Results  Component Value Date   TRIG 126 10/23/2022   Lab Results  Component Value Date   CHOLHDL 5.7 (H) 10/23/2022   Lab Results  Component Value Date   HGBA1C 5.2 10/23/2022      Assessment & Plan:  Obesity (BMI 35.0-39.9 without comorbidity) Assessment & Plan: She reports cutting out sodas and drinking only water She reports exercising at least 4 days a  week She has been accepted into the provider exercise program at the [facility] We will place a referral in today to a nutritionist Encouraged to follow-up in 4 weeks Encouraged to continue on  a heart healthy diet with increased disc activity Wt Readings from Last 3 Encounters:  11/23/22 232 lb 1.3 oz (105.3 kg)  10/31/22 227 lb (103 kg)  10/19/22 227 lb 0.6 oz (103 kg)     Orders: -     Amb ref to Medical Nutrition Therapy-MNT    Follow-up: Return in about 1 month (around 12/23/2022) for obesity.   Gilmore Laroche, FNP

## 2022-11-23 NOTE — Assessment & Plan Note (Signed)
She reports cutting out sodas and drinking only water She reports exercising at least 4 days a week She has been accepted into the provider exercise program at the [facility] We will place a referral in today to a nutritionist Encouraged to follow-up in 4 weeks Encouraged to continue on a heart healthy diet with increased disc activity Wt Readings from Last 3 Encounters:  11/23/22 232 lb 1.3 oz (105.3 kg)  10/31/22 227 lb (103 kg)  10/19/22 227 lb 0.6 oz (103 kg)

## 2022-11-23 NOTE — Patient Instructions (Signed)
I appreciate the opportunity to provide care to you today!    Follow up:  1 month   Physical activity helps: Lower your blood glucose, improve your heart health, lower your blood pressure and cholesterol, burn calories to help manage her weight, gave you energy, lower stress, and improve his sleep.  The American diabetes Association (ADA) recommends being active for 2-1/2 hours (150 minutes) or more week.  Exercise for 30 minutes, 5 days a week (150 minutes total)   Referrals today-nutritionist   Please continue to a heart-healthy diet and increase your physical activities.      It was a pleasure to see you and I look forward to continuing to work together on your health and well-being. Please do not hesitate to call the office if you need care or have questions about your care.   Have a wonderful day and week. With Gratitude, Gilmore Laroche MSN, FNP-BC

## 2022-12-25 ENCOUNTER — Encounter: Payer: Self-pay | Admitting: Family Medicine

## 2022-12-25 ENCOUNTER — Ambulatory Visit (INDEPENDENT_AMBULATORY_CARE_PROVIDER_SITE_OTHER): Payer: Medicaid Other | Admitting: Family Medicine

## 2022-12-25 VITALS — BP 116/80 | HR 84 | Wt 230.1 lb

## 2022-12-25 DIAGNOSIS — E669 Obesity, unspecified: Secondary | ICD-10-CM | POA: Diagnosis not present

## 2022-12-25 DIAGNOSIS — E559 Vitamin D deficiency, unspecified: Secondary | ICD-10-CM

## 2022-12-25 DIAGNOSIS — Z6838 Body mass index (BMI) 38.0-38.9, adult: Secondary | ICD-10-CM

## 2022-12-25 MED ORDER — VITAMIN D (ERGOCALCIFEROL) 1.25 MG (50000 UNIT) PO CAPS: 50000.0000 [IU] | ORAL_CAPSULE | ORAL | 1 refills | Status: DC

## 2022-12-25 NOTE — Patient Instructions (Signed)
I appreciate the opportunity to provide care to you today!    Follow up:  1 month   Keep up the good work!      Please continue to a heart-healthy diet and increase your physical activities. Try to exercise for at least five days a week.      It was a pleasure to see you and I look forward to continuing to work together on your health and well-being. Please do not hesitate to call the office if you need care or have questions about your care.   Have a wonderful day and week. With Gratitude, Gilmore Laroche MSN, FNP-BC

## 2022-12-25 NOTE — Assessment & Plan Note (Signed)
Patient has lost 2 pounds since her last visit She will be following up with the nutritionist on 12/27/2022 She reports implementing lifestyle modification with a heart healthy diet and increase physical activity Congratulated patient on her current weight loss Encouraged to continue on a heart healthy diet with increased physical activity Wt Readings from Last 3 Encounters:  12/25/22 230 lb 1.9 oz (104.4 kg)  11/23/22 232 lb 1.3 oz (105.3 kg)  10/31/22 227 lb (103 kg)

## 2022-12-25 NOTE — Progress Notes (Signed)
Established Patient Office Visit  Subjective:  Patient ID: Julie Case, female    DOB: 1990-12-05  Age: 32 y.o. MRN: [id number]  CC:  Chief Complaint  Patient presents with   Obesity    1 month f/u, pt reports eating better cutting out sweets, and walking as exercise.     HPI Julie Case is a 32 y.o. femalepresents for obesity follow-up. For the details of today's visit, please refer to the assessment and plan.     Past Medical History:  Diagnosis Date   Depression    Epilepsy (HCC)     Past Surgical History:  Procedure Laterality Date   CHOLECYSTECTOMY     ECTOPIC PREGNANCY SURGERY     with right tube removal    Family History  Problem Relation Age of Onset   Hypertension Other    Diabetes Other     Social History   Socioeconomic History   Marital status: Single    Spouse name: Not on file   Number of children: Not on file   Years of education: Not on file   Highest education level: 12th grade  Occupational History   Not on file  Tobacco Use   Smoking status: Every Day    Packs/day: .5    Types: Cigarettes   Smokeless tobacco: Never   Tobacco comments:    1/2 pack a day  Vaping Use   Vaping Use: Never used  Substance and Sexual Activity   Alcohol use: Not Currently   Drug use: Not Currently   Sexual activity: Not Currently    Birth control/protection: None  Other Topics Concern   Not on file  Social History Narrative   Not on file   Social Determinants of Health   Financial Resource Strain: Low Risk  (11/19/2022)   Overall Financial Resource Strain (CARDIA)    Difficulty of Paying Living Expenses: Not very hard  Food Insecurity: No Food Insecurity (11/19/2022)   Hunger Vital Sign    Worried About Running Out of Food in the Last Year: Never true    Ran Out of Food in the Last Year: Never true  Transportation Needs: No Transportation Needs (11/19/2022)   PRAPARE - Administrator, Civil Service (Medical): No    Lack of Transportation  (Non-Medical): No  Physical Activity: Insufficiently Active (11/19/2022)   Exercise Vital Sign    Days of Exercise per Week: 3 days    Minutes of Exercise per Session: 30 min  Stress: Stress Concern Present (11/19/2022)   [employer] of Occupational Health - Occupational Stress Questionnaire    Feeling of Stress : Rather much  Social Connections: Moderately Integrated (11/19/2022)   Social Connection and Isolation Panel [NHANES]    Frequency of Communication with Friends and Family: Three times a week    Frequency of Social Gatherings with Friends and Family: Once a week    Attends Religious Services: 1 to 4 times per year    Active Member of [employer] or Organizations: No    Attends Engineer, structural: Not on file    Marital Status: Living with partner  Intimate Partner Violence: Not on file    Outpatient Medications Prior to Visit  Medication Sig Dispense Refill   diclofenac Sodium (VOLTAREN) 1 % GEL Apply 2 g topically 4 (four) times daily. 2 g 0   fluticasone (FLONASE) 50 MCG/ACT nasal spray Place 2 sprays into both nostrils daily. 16 g 2   meloxicam (MOBIC) 7.5 MG tablet TAKE  1 TABLET(7.5 MG) BY MOUTH DAILY 30 tablet 0   ondansetron (ZOFRAN) 4 MG tablet Take 1 tablet (4 mg total) by mouth every 6 (six) hours. 12 tablet 0   PARoxetine (PAXIL) 30 MG tablet Take 1 tablet (30 mg total) by mouth daily. 30 tablet 2   Vitamin D, Ergocalciferol, (DRISDOL) 1.25 MG (50000 UNIT) CAPS capsule Take 1 capsule (50,000 Units total) by mouth every 7 (seven) days. 10 capsule 1   norethindrone (MICRONOR) 0.35 MG tablet Take 1 tablet (0.35 mg total) by mouth daily. 90 tablet 4   No facility-administered medications prior to visit.    Allergies  Allergen Reactions   Penicillins Shortness Of Breath and Rash   Naproxen Nausea And Vomiting    ROS Review of Systems  Constitutional:  Negative for chills and fever.  Eyes:  Negative for visual disturbance.  Respiratory:  Negative for  chest tightness and shortness of breath.   Neurological:  Negative for dizziness and headaches.      Objective:    Physical Exam Constitutional:      Appearance: She is obese.  HENT:     Head: Normocephalic.     Mouth/Throat:     Mouth: Mucous membranes are moist.  Cardiovascular:     Rate and Rhythm: Normal rate.     Heart sounds: Normal heart sounds.  Pulmonary:     Effort: Pulmonary effort is normal.     Breath sounds: Normal breath sounds.  Neurological:     Mental Status: She is alert.     BP 116/80   Pulse 84   Wt 230 lb 1.9 oz (104.4 kg)   SpO2 95%   BMI 38.29 kg/m  Wt Readings from Last 3 Encounters:  12/25/22 230 lb 1.9 oz (104.4 kg)  11/23/22 232 lb 1.3 oz (105.3 kg)  10/31/22 227 lb (103 kg)    Lab Results  Component Value Date   TSH 2.000 10/23/2022   Lab Results  Component Value Date   WBC 7.9 09/04/2022   HGB 15.6 (H) 09/04/2022   HCT 45.3 09/04/2022   MCV 85.8 09/04/2022   PLT 268 09/04/2022   Lab Results  Component Value Date   NA 137 09/04/2022   K 3.9 09/04/2022   CO2 23 09/04/2022   GLUCOSE 103 (H) 09/04/2022   BUN 6 09/04/2022   CREATININE 0.57 09/04/2022   BILITOT 0.9 09/04/2022   ALKPHOS 87 09/04/2022   AST 17 09/04/2022   ALT 20 09/04/2022   PROT 7.6 09/04/2022   ALBUMIN 4.3 09/04/2022   CALCIUM 9.3 09/04/2022   ANIONGAP 10 09/04/2022   EGFR 119 07/17/2022   Lab Results  Component Value Date   CHOL 193 10/23/2022   Lab Results  Component Value Date   HDL 34 (L) 10/23/2022   Lab Results  Component Value Date   LDLCALC 136 (H) 10/23/2022   Lab Results  Component Value Date   TRIG 126 10/23/2022   Lab Results  Component Value Date   CHOLHDL 5.7 (H) 10/23/2022   Lab Results  Component Value Date   HGBA1C 5.2 10/23/2022      Assessment & Plan:  Obesity (BMI 35.0-39.9 without comorbidity) Assessment & Plan: Patient has lost 2 pounds since her last visit She will be following up with the nutritionist on  12/27/2022 She reports implementing lifestyle modification with a heart healthy diet and increase physical activity Congratulated patient on her current weight loss Encouraged to continue on a heart healthy diet with increased  physical activity Wt Readings from Last 3 Encounters:  12/25/22 230 lb 1.9 oz (104.4 kg)  11/23/22 232 lb 1.3 oz (105.3 kg)  10/31/22 227 lb (103 kg)      Vitamin D deficiency -     Vitamin D (Ergocalciferol); Take 1 capsule (50,000 Units total) by mouth every 7 (seven) days.  Dispense: 20 capsule; Refill: 1    Follow-up: Return in about 1 month (around 01/25/2023).   Gilmore Laroche, FNP

## 2022-12-27 ENCOUNTER — Encounter: Payer: Medicaid Other | Attending: Family Medicine | Admitting: Nutrition

## 2022-12-27 ENCOUNTER — Encounter: Payer: Self-pay | Admitting: Nutrition

## 2022-12-27 VITALS — Ht 65.0 in | Wt 230.0 lb

## 2022-12-27 DIAGNOSIS — Z6838 Body mass index (BMI) 38.0-38.9, adult: Secondary | ICD-10-CM | POA: Insufficient documentation

## 2022-12-27 DIAGNOSIS — Z713 Dietary counseling and surveillance: Secondary | ICD-10-CM | POA: Insufficient documentation

## 2022-12-27 DIAGNOSIS — E669 Obesity, unspecified: Secondary | ICD-10-CM | POA: Insufficient documentation

## 2022-12-27 NOTE — Patient Instructions (Signed)
Goals  Cut out sodas completely. Cut out beef and fast foods Increase fruits, vegetables and whole grains. Keep exercising by walking for 10,000 steps per day B) 8  L)12-2 D) 5-7 Lose 1-2 lbs per week.

## 2022-12-27 NOTE — Progress Notes (Signed)
Medical Nutrition Therapy  Appointment Start time:  1415  Appointment End time:  1540  Primary concerns today: Obesity  Referral diagnosis: E66.9 Preferred learning style: No preference . Learning readiness: CHange in progress. (not ready, contemplating, ready, change in progress)   NUTRITION ASSESSMENT  41 y rold wfemale referred for weight loss and Hyperlipidemia.Marland Kitchen BMI 38. LDL 136 and HDL 34.She is not on any statins at this time.Willing to work on a whole plant based diet to improve her health and reduce lipid levels.  H/o of depression and Vit D Deficiency. She notes she hasn't take her Paxil in a few months. Called pharmacy and the refill is ready to be picked up. Talked about the dangers of stopping Paxil cold [place] and risks. She is not seeing a therapist but would like to be referred. PCP Gilmore Laroche. She reports losing 4 lbs in the last month or so after seeing her PCP. She notes she has cut down on  sodas and a lot of processed junk foods. Trying to eat healthier. Started walking recently. Typically eating 2 meals per day. Skips lunch. Sleeps in late.  Current diet is inconsistent and insuffient to meet her nutritional needs for health and healthy long term weight loss.   She is willing to work on a whole plant based lifestyle to improve her health, lose weight and prevent medical complications.  Anthropometrics  Wt Readings from Last 3 Encounters:  12/25/22 230 lb 1.9 oz (104.4 kg)  11/23/22 232 lb 1.3 oz (105.3 kg)  10/31/22 227 lb (103 kg)   Ht Readings from Last 3 Encounters:  11/23/22 5\' 5"  (1.651 m)  10/31/22 5\' 5"  (1.651 m)  10/19/22 5\' 5"  (1.651 m)   There is no height or weight on file to calculate BMI. @BMIFA @ Facility age limit for growth %iles is 20 years. Facility age limit for growth %iles is 20 years.    Clinical Medical Hx: See chart Medications: See chart Labs:     Latest Ref Rng & Units 09/04/2022    1:46 PM 07/17/2022   11:53 AM 05/13/2022     3:31 PM  CMP  Glucose 70 - 99 mg/dL 914  94  96   BUN 6 - 20 mg/dL 6  10  9    Creatinine 0.44 - 1.00 mg/dL 7.82  9.56  2.13   Sodium 135 - 145 mmol/L 137  139  140   Potassium 3.5 - 5.1 mmol/L 3.9  4.5  3.8   Chloride 98 - 111 mmol/L 104  103  109   CO2 22 - 32 mmol/L 23  20  24    Calcium 8.9 - 10.3 mg/dL 9.3  9.9  9.5   Total Protein 6.5 - 8.1 g/dL 7.6  6.9    Total Bilirubin 0.3 - 1.2 mg/dL 0.9  0.3    Alkaline Phos 38 - 126 U/L 87  107    AST 15 - 41 U/L 17  10    ALT 0 - 44 U/L 20  13    Lipid Panel     Component Value Date/Time   CHOL 193 10/23/2022 1137   TRIG 126 10/23/2022 1137   HDL 34 (L) 10/23/2022 1137   CHOLHDL 5.7 (H) 10/23/2022 1137   LDLCALC 136 (H) 10/23/2022 1137   LABVLDL 23 10/23/2022 1137    Notable Signs/Symptoms: None  Lifestyle & Dietary Hx LIves with boyfried She shops and cooks.  Estimated daily fluid intake: 60 oz Supplements: none Sleep: 8 hrs Stress /  self-care: none Current average weekly physical activity: Walking daily  30 minutes,  24-Hr Dietary Recall First Meal: Coffee- sugar and cream. Snack:  Second Meal:  Snack:  Third Meal: 4 pm Grilled chicken sandwich, Water Snack:  Beverages: water  Estimated Energy Needs Calories: 1200 Carbohydrate: 135g Protein: 90g Fat: 3.3g   NUTRITION DIAGNOSIS  NI-1.7 Predicted excessive energy intake As related to Diet recall and Obesity and Hyperlipidemia.  As evidenced by BMI 38, LDL 236 and HDL 34.Marland Kitchen   NUTRITION INTERVENTION  Nutrition education (E-1) on the following topics:  Lifestyle Medicine  - Whole Food, Plant Predominant Nutrition is highly recommended: Eat Plenty of vegetables, Mushrooms, fruits, Legumes, Whole Grains, Nuts, seeds in lieu of processed meats, processed snacks/pastries red meat, poultry, eggs.    -It is better to avoid simple carbohydrates including: Cakes, Sweet Desserts, Ice Cream, Soda (diet and regular), Sweet Tea, Candies, Chips, Cookies, Store Bought  Juices, Alcohol in Excess of  1-2 drinks a day, Lemonade,  Artificial Sweeteners, Doughnuts, Coffee Creamers, "Sugar-free" Products, etc, etc.  This is not a complete list.....  Exercise: If you are able: 30 -60 minutes a day ,4 days a week, or 150 minutes a week.  The longer the better.  Combine stretch, strength, and aerobic activities.  If you were told in the past that you have high risk for cardiovascular diseases, you may seek evaluation by your heart doctor prior to initiating moderate to intense exercise programs.  Dangers of stopping Paxil without medical supervision.   Handouts Provided Include  Lifestyle Medicine handouts   Learning Style & Readiness for Change Teaching method utilized: Visual & Auditory  Demonstrated degree of understanding via: Teach Back  Barriers to learning/adherence to lifestyle change: None  Goals Established by Pt Goals  Cut out sodas completely. Cut out beef and fast foods Increase fruits, vegetables and whole grains. Keep exercising by walking for 10,000 steps per day B) 8  L)12-2 D) 5-7 Lose 1-2 lbs per week.   MONITORING & EVALUATION Dietary intake, weekly physical activity, and weight in 1 month.  Next Steps  Patient is to work on meal planning and cutting out processed foods.Marland Kitchen

## 2023-01-20 ENCOUNTER — Other Ambulatory Visit: Payer: Self-pay

## 2023-01-20 ENCOUNTER — Emergency Department (HOSPITAL_COMMUNITY)
Admission: EM | Admit: 2023-01-20 | Discharge: 2023-01-20 | Disposition: A | Discharge status: Home / Self Care | Payer: Medicaid Other | Attending: Emergency Medicine | Admitting: Emergency Medicine

## 2023-01-20 ENCOUNTER — Encounter (HOSPITAL_COMMUNITY): Payer: Self-pay | Admitting: *Deleted

## 2023-01-20 DIAGNOSIS — R0981 Nasal congestion: Secondary | ICD-10-CM | POA: Diagnosis not present

## 2023-01-20 DIAGNOSIS — Z1152 Encounter for screening for COVID-19: Secondary | ICD-10-CM | POA: Diagnosis not present

## 2023-01-20 DIAGNOSIS — I951 Orthostatic hypotension: Secondary | ICD-10-CM | POA: Insufficient documentation

## 2023-01-20 DIAGNOSIS — R42 Dizziness and giddiness: Secondary | ICD-10-CM

## 2023-01-20 LAB — MAGNESIUM: Magnesium: 1.9 mg/dL (ref 1.7–2.4)

## 2023-01-20 LAB — BASIC METABOLIC PANEL
Anion gap: 9 (ref 5–15)
BUN: 7 mg/dL (ref 6–20)
CO2: 24 mmol/L (ref 22–32)
Calcium: 9 mg/dL (ref 8.9–10.3)
Chloride: 105 mmol/L (ref 98–111)
Creatinine, Ser: 0.58 mg/dL (ref 0.44–1.00)
GFR, Estimated: >60 mL/min (ref >60)
Glucose, Bld: 101 mg/dL — ABNORMAL HIGH (ref 70–99)
Potassium: 3.6 mmol/L (ref 3.5–5.1)
Sodium: 138 mmol/L (ref 135–145)

## 2023-01-20 LAB — CBC
HCT: 41.5 % (ref 36.0–46.0)
Hemoglobin: 14.4 g/dL (ref 12.0–15.0)
MCH: 29 pg (ref 26.0–34.0)
MCHC: 34.7 g/dL (ref 30.0–36.0)
MCV: 83.7 fL (ref 80.0–100.0)
Platelets: 255 10*3/uL (ref 150–400)
RBC: 4.96 MIL/uL (ref 3.87–5.11)
RDW: 12.5 % (ref 11.5–15.5)
WBC: 6 10*3/uL (ref 4.0–10.5)
nRBC: 0 % (ref 0.0–0.2)

## 2023-01-20 LAB — SARS CORONAVIRUS 2 BY RT PCR: SARS Coronavirus 2 by RT PCR: NEGATIVE

## 2023-01-20 LAB — PREGNANCY, URINE: Preg Test, Ur: NEGATIVE

## 2023-01-20 MED ORDER — DOXYCYCLINE HYCLATE 100 MG PO CAPS: 100.0000 mg | ORAL_CAPSULE | Freq: Two times a day (BID) | ORAL | 0 refills | Status: AC

## 2023-01-20 MED ORDER — ACETAMINOPHEN 500 MG PO TABS
1000.0000 mg | ORAL_TABLET | Freq: Once | ORAL | Status: AC
  Administered 2023-01-20: 1000 mg via ORAL
  Filled 2023-01-20: qty 2

## 2023-01-20 MED ORDER — LACTATED RINGERS IV BOLUS
1000.0000 mL | Freq: Once | INTRAVENOUS | Status: AC
  Administered 2023-01-20: 1000 mL via INTRAVENOUS

## 2023-01-20 NOTE — ED Provider Notes (Signed)
[place] EMERGENCY DEPARTMENT AT [facility] Provider Note   CSN: [id number] Arrival date & time: 01/20/23  1338     History  Chief Complaint  Patient presents with   Dizziness    Julie Case is a 32 y.o. female.  32 year old female with history of depression and epilepsy presents emergency department with headache, congestion, and dizziness.  States that yesterday started having some congestion and dizziness.  Says that she was walking to her room and felt that she was going to pass out.  Boyfriend had to catch her.  Says that she has been feeling lightheaded with standing but occasionally will have a room spinning sensation as well.  No hearing loss or tinnitus.  Today started developing a right-sided headache with photophobia.  No fevers or neck stiffness.  No vomiting.  Says it was gradual in onset and consistent with prior headaches but more severe.  No known sick contacts.  No cough or shortness of breath.       Home Medications Prior to Admission medications   Medication Sig Start Date End Date Taking? Authorizing Provider  doxycycline (VIBRAMYCIN) 100 MG capsule Take 1 capsule (100 mg total) by mouth 2 (two) times daily for 7 days. 01/20/23 01/27/23 Yes Rondel Baton, MD  diclofenac Sodium (VOLTAREN) 1 % GEL Apply 2 g topically 4 (four) times daily. Patient not taking: Reported on 12/27/2022 08/06/22   Gilmore Laroche, FNP  fluticasone [facility]) 50 MCG/ACT nasal spray Place 2 sprays into both nostrils daily. 10/19/22   Gilmore Laroche, FNP  meloxicam (MOBIC) 7.5 MG tablet TAKE 1 TABLET(7.5 MG) BY MOUTH DAILY Patient not taking: Reported on 12/27/2022 10/16/22   Gilmore Laroche, FNP  norethindrone (MICRONOR) 0.35 MG tablet Take 1 tablet (0.35 mg total) by mouth daily. 02/12/22 05/13/22  Myna Hidalgo, DO  ondansetron (ZOFRAN) 4 MG tablet Take 1 tablet (4 mg total) by mouth every 6 (six) hours. 09/04/22   Burgess Amor, PA-C  PARoxetine (PAXIL) 30 MG tablet Take 1 tablet  (30 mg total) by mouth daily. Patient not taking: Reported on 12/27/2022 07/13/22   Gilmore Laroche, FNP  Vitamin D, Ergocalciferol, (DRISDOL) 1.25 MG (50000 UNIT) CAPS capsule Take 1 capsule (50,000 Units total) by mouth every 7 (seven) days. 12/25/22   Gilmore Laroche, FNP      Allergies    Penicillins and Naproxen    Review of Systems   Review of Systems  Physical Exam Updated Vital Signs BP 123/81   Pulse 78   Temp 98.1 F (36.7 C) (Oral)   Resp 16   Ht 5\' 5"  (1.651 m)   Wt 99.8 kg   LMP 01/15/2023   SpO2 97%   BMI 36.61 kg/m  Physical Exam Vitals and nursing note reviewed.  Constitutional:      General: She is not in acute distress.    Appearance: She is well-developed.  HENT:     Head: Normocephalic and atraumatic.     Right Ear: Tympanic membrane, ear canal and external ear normal.     Left Ear: Tympanic membrane, ear canal and external ear normal.     Nose: Nose normal.  Eyes:     Extraocular Movements: Extraocular movements intact.     Conjunctiva/sclera: Conjunctivae normal.     Pupils: Pupils are equal, round, and reactive to light.  Cardiovascular:     Rate and Rhythm: Normal rate and regular rhythm.     Heart sounds: No murmur heard. Pulmonary:     Effort: Pulmonary  effort is normal. No respiratory distress.     Breath sounds: Normal breath sounds.  Musculoskeletal:     Cervical back: Normal range of motion and neck supple.     Right lower leg: No edema.     Left lower leg: No edema.  Skin:    General: Skin is warm and dry.  Neurological:     Mental Status: She is alert and oriented to person, place, and time. Mental status is at baseline.     Comments: MENTAL STATUS: AAOx3 CRANIAL NERVES: II: Pupils equal and reactive 4 mm BL, no RAPD, no VF deficits III, IV, VI: EOM intact, no gaze preference or deviation, no nystagmus. V: normal sensation to light touch in V1, V2, and V3 segments bilaterally VII: no facial weakness or asymmetry, no nasolabial  fold flattening VIII: normal hearing to speech and finger friction IX, X: normal palatal elevation, no uvular deviation XI: 5/5 head turn and 5/5 shoulder shrug bilaterally XII: midline tongue protrusion MOTOR: 5/5 strength in R shoulder flexion, elbow flexion and extension, and grip strength. 5/5 strength in L shoulder flexion, elbow flexion and extension, and grip strength.  5/5 strength in R hip and knee flexion, knee extension, ankle plantar and dorsiflexion. 5/5 strength in L hip and knee flexion, knee extension, ankle plantar and dorsiflexion. SENSORY: Normal sensation to light touch in all extremities COORD: Normal finger to nose and heel to shin, no tremor, no dysmetria STATION: normal stance, no truncal ataxia GAIT: Normal   Psychiatric:        Mood and Affect: Mood normal.     ED Results / Procedures / Treatments   Labs (all labs ordered are listed, but only abnormal results are displayed) Labs Reviewed  BASIC METABOLIC PANEL - Abnormal; Notable for the following components:      Result Value   Glucose, Bld 101 (*)    All other components within normal limits  SARS CORONAVIRUS 2 BY RT PCR  CBC  MAGNESIUM  PREGNANCY, URINE    EKG EKG Interpretation  Date/Time:  Sunday January 20 2023 15:52:57 EDT Ventricular Rate:  71 PR Interval:  137 QRS Duration: 98 QT Interval:  396 QTC Calculation: 431 R Axis:   29 Text Interpretation: Sinus rhythm Low voltage, precordial leads Confirmed by Vonita Moss [id number]) on 01/20/2023 4:01:51 PM  Radiology No results found.  Procedures Procedures    Medications Ordered in ED Medications  lactated ringers bolus 1,000 mL (0 mLs Intravenous Stopped 01/20/23 1709)  acetaminophen (TYLENOL) tablet 1,000 mg (1,000 mg Oral Given 01/20/23 1609)    ED Course/ Medical Decision Making/ A&P                             Medical Decision Making Amount and/or Complexity of Data Reviewed Labs: ordered.  Risk OTC drugs. Prescription  drug management.   Julie Case is a 32 y.o. female with comorbidities that complicate the patient evaluation including depression epilepsy who presents emergency department with headache, congestion, and dizziness  Initial Ddx:  Sinusitis, ear infection, central vertigo, peripheral vertigo, orthostasis, dehydration  MDM/Course:  The symptoms appear most consistent with orthostasis and dehydration.  Feel this may be in the setting of a URI or sinusitis.  Patient had a COVID swab that were sent that were unremarkable.  Did receive IV fluids and was feeling better upon reevaluation.  With her nonfocal neurologic exam do not feel that there is any indication for advanced  imaging to rule out central vertigo.  EKG without any evidence of concerning arrhythmias.  No arrhythmias noted on telemetry while in the emergency department.  Patient discharged home with instructions to follow-up with her primary doctor in 2 to 3 days.  Was also told that if her symptoms do not improve she may need antibiotics in 1 week to treat her for sinusitis.  This patient presents to the ED for concern of complaints listed in HPI, this involves an extensive number of treatment options, and is a complaint that carries with it a high risk of complications and morbidity. Disposition including potential need for admission considered.   Dispo: DC Home. Return precautions discussed including, but not limited to, those listed in the AVS. Allowed pt time to ask questions which were answered fully prior to dc.  Additional history obtained from significant other Records reviewed Outpatient Clinic Notes The following labs were independently interpreted: Chemistry and show no acute abnormality I personally reviewed and interpreted cardiac monitoring: normal sinus rhythm  I personally reviewed and interpreted the pt's EKG: see above for interpretation  I have reviewed the patients home medications and made adjustments as  needed        Final Clinical Impression(s) / ED Diagnoses Final diagnoses:  Dizziness  Orthostasis  Sinus congestion    Rx / DC Orders ED Discharge Orders          Ordered    doxycycline (VIBRAMYCIN) 100 MG capsule  2 times daily        01/20/23 1703              Rondel Baton, MD 01/20/23 2114

## 2023-01-20 NOTE — Discharge Instructions (Addendum)
You were seen for your dizziness and congestion in the emergency department.  You likely have a sinus infection.  At home, please take Tylenol and ibuprofen for any headaches that you have.  You may use over-the-counter decongestions for your nasal congestion.  Consider sinus rinses as well.  If your symptoms do not improve after 1 week fill antibiotic we have prescribed you (doxycycline) and take it for a sinus infection.    Check your MyChart online for the results of any tests that had not resulted by the time you left the emergency department.   Follow-up with your primary doctor in 2-3 days regarding your visit.    Return immediately to the emergency department if you experience any of the following: Worsening pain, loss of consciousness, or any other concerning symptoms.    Thank you for visiting our Emergency Department. It was a pleasure taking care of you today.

## 2023-01-20 NOTE — ED Triage Notes (Signed)
Pt with dizziness since last night.  Pt with HA with waking up today.  States dizziness comes and goes.  + nasal congestion. Denies any sick contacts.

## 2023-01-20 NOTE — ED Notes (Signed)
Pt informed of need for urine sample.

## 2023-01-28 ENCOUNTER — Ambulatory Visit (INDEPENDENT_AMBULATORY_CARE_PROVIDER_SITE_OTHER): Payer: Medicaid Other | Admitting: Family Medicine

## 2023-01-28 ENCOUNTER — Encounter: Payer: Self-pay | Admitting: Family Medicine

## 2023-01-28 VITALS — BP 122/78 | HR 98 | Ht 65.0 in | Wt 230.0 lb

## 2023-01-28 DIAGNOSIS — E669 Obesity, unspecified: Secondary | ICD-10-CM

## 2023-01-28 DIAGNOSIS — Z6838 Body mass index (BMI) 38.0-38.9, adult: Secondary | ICD-10-CM | POA: Diagnosis not present

## 2023-01-28 MED ORDER — PHENTERMINE HCL 15 MG PO CAPS
15.0000 mg | ORAL_CAPSULE | ORAL | 0 refills | Status: DC
Start: 2023-01-28 — End: 2023-02-20

## 2023-01-28 NOTE — Assessment & Plan Note (Signed)
The patient has follow-up with a licensed nutritionist and reports implementing lifestyle changes She is interested in pharmacological treatment  We will start the patient on phentermine 15 mg daily She does not have any contraindications The patient is not pregnant no history of hypothyroidism glycol Mia or CVA Encouraged to continue to implement lifestyle changes with increased physical activity and a heart healthy diet We will follow-up in 1 month to assess heart rate and respiration Wt Readings from Last 3 Encounters:  01/28/23 230 lb 0.6 oz (104.3 kg)  01/20/23 220 lb (99.8 kg)  12/27/22 230 lb (104.3 kg)

## 2023-01-28 NOTE — Progress Notes (Signed)
Established Patient Office Visit  Subjective:  Patient ID: Julie Case, female    DOB: 11/24/1990  Age: 32 y.o. MRN: [id number]  CC:  Chief Complaint  Patient presents with   Obesity    1 month f/u for weight management.    HPI Julie Case is a 32 y.o. female presents for obesity follow-up. For the details of today's visit, please refer to the assessment and plan.       Past Medical History:  Diagnosis Date   Depression    Epilepsy (HCC)     Past Surgical History:  Procedure Laterality Date   CHOLECYSTECTOMY     ECTOPIC PREGNANCY SURGERY     with right tube removal    Family History  Problem Relation Age of Onset   Hypertension Other    Diabetes Other     Social History   Socioeconomic History   Marital status: Single    Spouse name: Not on file   Number of children: Not on file   Years of education: Not on file   Highest education level: 12th grade  Occupational History   Not on file  Tobacco Use   Smoking status: Every Day    Packs/day: .5    Types: Cigarettes   Smokeless tobacco: Never   Tobacco comments:    1/2 pack a day  Vaping Use   Vaping Use: Never used  Substance and Sexual Activity   Alcohol use: Not Currently   Drug use: Not Currently   Sexual activity: Not Currently    Birth control/protection: None  Other Topics Concern   Not on file  Social History Narrative   Not on file   Social Determinants of Health   Financial Resource Strain: Low Risk  (11/19/2022)   Overall Financial Resource Strain (CARDIA)    Difficulty of Paying Living Expenses: Not very hard  Food Insecurity: No Food Insecurity (11/19/2022)   Hunger Vital Sign    Worried About Running Out of Food in the Last Year: Never true    Ran Out of Food in the Last Year: Never true  Transportation Needs: No Transportation Needs (11/19/2022)   PRAPARE - Administrator, Civil Service (Medical): No    Lack of Transportation (Non-Medical): No  Physical Activity:  Insufficiently Active (11/19/2022)   Exercise Vital Sign    Days of Exercise per Week: 3 days    Minutes of Exercise per Session: 30 min  Stress: Stress Concern Present (11/19/2022)   [employer] of Occupational Health - Occupational Stress Questionnaire    Feeling of Stress : Rather much  Social Connections: Moderately Integrated (11/19/2022)   Social Connection and Isolation Panel [NHANES]    Frequency of Communication with Friends and Family: Three times a week    Frequency of Social Gatherings with Friends and Family: Once a week    Attends Religious Services: 1 to 4 times per year    Active Member of [employer] or Organizations: No    Attends Engineer, structural: Not on file    Marital Status: Living with partner  Intimate Partner Violence: Not on file    Outpatient Medications Prior to Visit  Medication Sig Dispense Refill   diclofenac Sodium (VOLTAREN) 1 % GEL Apply 2 g topically 4 (four) times daily. (Patient not taking: Reported on 12/27/2022) 2 g 0   fluticasone (FLONASE) 50 MCG/ACT nasal spray Place 2 sprays into both nostrils daily. 16 g 2   meloxicam (MOBIC) 7.5 MG tablet TAKE  1 TABLET(7.5 MG) BY MOUTH DAILY (Patient not taking: Reported on 12/27/2022) 30 tablet 0   norethindrone (MICRONOR) 0.35 MG tablet Take 1 tablet (0.35 mg total) by mouth daily. 90 tablet 4   ondansetron (ZOFRAN) 4 MG tablet Take 1 tablet (4 mg total) by mouth every 6 (six) hours. 12 tablet 0   PARoxetine (PAXIL) 30 MG tablet Take 1 tablet (30 mg total) by mouth daily. (Patient not taking: Reported on 12/27/2022) 30 tablet 2   Vitamin D, Ergocalciferol, (DRISDOL) 1.25 MG (50000 UNIT) CAPS capsule Take 1 capsule (50,000 Units total) by mouth every 7 (seven) days. 20 capsule 1   No facility-administered medications prior to visit.    Allergies  Allergen Reactions   Penicillins Shortness Of Breath and Rash   Naproxen Nausea And Vomiting    ROS Review of Systems  Constitutional:  Negative for  chills and fever.  Eyes:  Negative for visual disturbance.  Respiratory:  Negative for chest tightness and shortness of breath.   Neurological:  Negative for dizziness and headaches.      Objective:    Physical Exam HENT:     Head: Normocephalic.     Mouth/Throat:     Mouth: Mucous membranes are moist.  Cardiovascular:     Rate and Rhythm: Normal rate.     Heart sounds: Normal heart sounds.  Pulmonary:     Effort: Pulmonary effort is normal.     Breath sounds: Normal breath sounds.  Neurological:     Mental Status: She is alert.     BP 122/78   Pulse 98   Ht 5\' 5"  (1.651 m)   Wt 230 lb 0.6 oz (104.3 kg)   LMP 01/15/2023   SpO2 99%   BMI 38.28 kg/m  Wt Readings from Last 3 Encounters:  01/28/23 230 lb 0.6 oz (104.3 kg)  01/20/23 220 lb (99.8 kg)  12/27/22 230 lb (104.3 kg)    Lab Results  Component Value Date   TSH 2.000 10/23/2022   Lab Results  Component Value Date   WBC 6.0 01/20/2023   HGB 14.4 01/20/2023   HCT 41.5 01/20/2023   MCV 83.7 01/20/2023   PLT 255 01/20/2023   Lab Results  Component Value Date   NA 138 01/20/2023   K 3.6 01/20/2023   CO2 24 01/20/2023   GLUCOSE 101 (H) 01/20/2023   BUN 7 01/20/2023   CREATININE 0.58 01/20/2023   BILITOT 0.9 09/04/2022   ALKPHOS 87 09/04/2022   AST 17 09/04/2022   ALT 20 09/04/2022   PROT 7.6 09/04/2022   ALBUMIN 4.3 09/04/2022   CALCIUM 9.0 01/20/2023   ANIONGAP 9 01/20/2023   EGFR 119 07/17/2022   Lab Results  Component Value Date   CHOL 193 10/23/2022   Lab Results  Component Value Date   HDL 34 (L) 10/23/2022   Lab Results  Component Value Date   LDLCALC 136 (H) 10/23/2022   Lab Results  Component Value Date   TRIG 126 10/23/2022   Lab Results  Component Value Date   CHOLHDL 5.7 (H) 10/23/2022   Lab Results  Component Value Date   HGBA1C 5.2 10/23/2022      Assessment & Plan:  Obesity (BMI 35.0-39.9 without comorbidity) Assessment & Plan: The patient has follow-up with  a licensed nutritionist and reports implementing lifestyle changes She is interested in pharmacological treatment  We will start the patient on phentermine 15 mg daily She does not have any contraindications The patient is not pregnant  no history of hypothyroidism glycol Mia or CVA Encouraged to continue to implement lifestyle changes with increased physical activity and a heart healthy diet We will follow-up in 1 month to assess heart rate and respiration Wt Readings from Last 3 Encounters:  01/28/23 230 lb 0.6 oz (104.3 kg)  01/20/23 220 lb (99.8 kg)  12/27/22 230 lb (104.3 kg)     Orders: -     Phentermine HCl; Take 1 capsule (15 mg total) by mouth every morning.  Dispense: 30 capsule; Refill: 0  Note: This chart has been completed using Engineer, civil (consulting) software, and while attempts have been made to ensure accuracy, certain words and phrases may not be transcribed as intended.    Follow-up: Return in about 1 month (around 02/28/2023).   Gilmore Laroche, FNP

## 2023-01-28 NOTE — Patient Instructions (Addendum)
I appreciate the opportunity to provide care to you today!    Follow up:  1 month     Please continue to a heart-healthy diet and increase your physical activities. Try to exercise for 30mins at least five days a week.      It was a pleasure to see you and I look forward to continuing to work together on your health and well-being. Please do not hesitate to call the office if you need care or have questions about your care.   Have a wonderful day and week. With Gratitude, Cheston Coury MSN, FNP-BC  

## 2023-02-20 ENCOUNTER — Ambulatory Visit (INDEPENDENT_AMBULATORY_CARE_PROVIDER_SITE_OTHER): Payer: Medicaid Other | Admitting: *Deleted

## 2023-02-20 ENCOUNTER — Other Ambulatory Visit: Payer: Self-pay | Admitting: Adult Health

## 2023-02-20 VITALS — BP 133/90 | HR 105 | Ht 67.0 in | Wt 234.0 lb

## 2023-02-20 DIAGNOSIS — N926 Irregular menstruation, unspecified: Secondary | ICD-10-CM

## 2023-02-20 DIAGNOSIS — Z3201 Encounter for pregnancy test, result positive: Secondary | ICD-10-CM

## 2023-02-20 LAB — POCT URINE PREGNANCY: Preg Test, Ur: POSITIVE — AB

## 2023-02-20 MED ORDER — PROMETHAZINE HCL 12.5 MG PO TABS: 12.5000 mg | ORAL_TABLET | Freq: Four times a day (QID) | ORAL | 1 refills | Status: DC | PRN

## 2023-02-20 NOTE — Progress Notes (Signed)
   NURSE VISIT- PREGNANCY CONFIRMATION   SUBJECTIVE:  Julie Case is a 32 y.o. [phone] female at [redacted]w[redacted]d by certain LMP of Patient's last menstrual period was 01/13/2023. Here for pregnancy confirmation.  Home pregnancy test: positive x 2   She reports nausea.  She is taking prenatal vitamins.    OBJECTIVE:  BP (!) 144/95 (BP Location: Left Arm, Patient Position: Sitting, Cuff Size: Normal)   Pulse (!) 105   Ht 5\' 7"  (1.702 m)   Wt 234 lb (106.1 kg)   LMP 01/13/2023   BMI 36.65 kg/m   Appears well, in no apparent distress  Results for orders placed or performed in visit on 02/20/23 (from the past 24 hour(s))  POCT urine pregnancy   Collection Time: 02/20/23  3:12 PM  Result Value Ref Range   Preg Test, Ur Positive (A) Negative    ASSESSMENT: Positive pregnancy test, [redacted]w[redacted]d by LMP    PLAN: Schedule for dating ultrasound in 2-3 weeks Prenatal vitamins: continue   Nausea medicines: requested-note routed to Cyril Mourning, NP to send prescription   OB packet given: Yes  Jobe Marker  02/20/2023 3:13 PM

## 2023-02-20 NOTE — Progress Notes (Signed)
Rx sent for phenergan.

## 2023-03-04 ENCOUNTER — Encounter: Payer: Self-pay | Admitting: Nutrition

## 2023-03-04 ENCOUNTER — Encounter: Payer: Medicaid Other | Attending: Family Medicine | Admitting: Nutrition

## 2023-03-04 DIAGNOSIS — Z6838 Body mass index (BMI) 38.0-38.9, adult: Secondary | ICD-10-CM | POA: Diagnosis present

## 2023-03-04 NOTE — Progress Notes (Unsigned)
Medical Nutrition Therapy  Appointment Start time:  1520 Appointment End time:  1600  Primary concerns today: Obesity  Referral diagnosis: E66.9 Preferred learning style: No preference . Learning readiness: CHange in progress.  NUTRITION ASSESSMENT Follow up Obesity "I just found out I am pregnant."  Here with her husband. [redacted] weeks pregnant. Goals previously set:  Cut out sodas completely.-Done Cut out beef and fast foods-done well. Increase fruits, vegetables and whole grains.-  has increased them  Keep exercising by walking for 10,000 steps per day-walking 30 minutes a day B) 8  L)12-2 D) 5-7--work in progress.   Pregnant now and has morning sickness Lose 1-2 lbs per week.- no weight loss; [redacted] weeks pregnant.   She has cut out coffee and drinking only water. Eating food at home and not eating out much anymore. Has appt with PCP tomorrow and OBGYN this week.  Has made slow progress with better choices with her eating habits. Wiling to work on exercising more. Still needs to work on more whole plant based foods and cutting out processed foods.   Anthropometrics  Wt Readings from Last 3 Encounters:  02/20/23 234 lb (106.1 kg)  01/28/23 230 lb 0.6 oz (104.3 kg)  01/20/23 220 lb (99.8 kg)   Ht Readings from Last 3 Encounters:  02/20/23 5\' 7"  (1.702 m)  01/28/23 5\' 5"  (1.651 m)  01/20/23 5\' 5"  (1.651 m)   There is no height or weight on file to calculate BMI. @BMIFA @ Facility age limit for growth %iles is 20 years. Facility age limit for growth %iles is 20 years.    Clinical Medical Hx: See chart Medications: See chart Labs:     Latest Ref Rng & Units 01/20/2023    4:09 PM 09/04/2022    1:46 PM 07/17/2022   11:53 AM  CMP  Glucose 70 - 99 mg/dL 518  841  94   BUN 6 - 20 mg/dL 7  6  10    Creatinine 0.44 - 1.00 mg/dL 6.60  6.30  1.60   Sodium 135 - 145 mmol/L 138  137  139   Potassium 3.5 - 5.1 mmol/L 3.6  3.9  4.5   Chloride 98 - 111 mmol/L 105  104  103   CO2 22 -  32 mmol/L 24  23  20    Calcium 8.9 - 10.3 mg/dL 9.0  9.3  9.9   Total Protein 6.5 - 8.1 g/dL  7.6  6.9   Total Bilirubin 0.3 - 1.2 mg/dL  0.9  0.3   Alkaline Phos 38 - 126 U/L  87  107   AST 15 - 41 U/L  17  10   ALT 0 - 44 U/L  20  13   Lipid Panel     Component Value Date/Time   CHOL 193 10/23/2022 1137   TRIG 126 10/23/2022 1137   HDL 34 (L) 10/23/2022 1137   CHOLHDL 5.7 (H) 10/23/2022 1137   LDLCALC 136 (H) 10/23/2022 1137   LABVLDL 23 10/23/2022 1137    Notable Signs/Symptoms: None  Lifestyle & Dietary Hx LIves with boyfried She shops and cooks.  Estimated daily fluid intake: 60 oz Supplements: none Sleep: 8 hrs Stress / self-care: none Current average weekly physical activity: Walking daily  30 minutes,  24-Hr Dietary Recall First Meal: Gravy and biscuit, tomato, Snack:  Second Meal: PB sandwich on white bread. Snack:  Third Meal: mac/cheese,    Pinto beans, cornbread,  corn, green beans, water Snack:  Beverages: water  Estimated Energy Needs Calories: 1200 Carbohydrate: 135g Protein: 90g Fat: 3.3g   NUTRITION DIAGNOSIS  NI-1.7 Predicted excessive energy intake As related to Diet recall and Obesity and Hyperlipidemia.  As evidenced by BMI 38, LDL 236 and HDL 34.Marland Kitchen   NUTRITION INTERVENTION  Nutrition education (E-1) on the following topics:  Lifestyle Medicine  - Whole Food, Plant Predominant Nutrition is highly recommended: Eat Plenty of vegetables, Mushrooms, fruits, Legumes, Whole Grains, Nuts, seeds in lieu of processed meats, processed snacks/pastries red meat, poultry, eggs.    -It is better to avoid simple carbohydrates including: Cakes, Sweet Desserts, Ice Cream, Soda (diet and regular), Sweet Tea, Candies, Chips, Cookies, Store Bought Juices, Alcohol in Excess of  1-2 drinks a day, Lemonade,  Artificial Sweeteners, Doughnuts, Coffee Creamers, "Sugar-free" Products, etc, etc.  This is not a complete list.....  Exercise: If you are able: 30 -60  minutes a day ,4 days a week, or 150 minutes a week.  The longer the better.  Combine stretch, strength, and aerobic activities.  If you were told in the past that you have high risk for cardiovascular diseases, you may seek evaluation by your heart doctor prior to initiating moderate to intense exercise programs.  Dangers of stopping Paxil without medical supervision.   Handouts Provided Include  Lifestyle Medicine handouts   Learning Style & Readiness for Change Teaching method utilized: Visual & Auditory  Demonstrated degree of understanding via: Teach Back  Barriers to learning/adherence to lifestyle change: None  Goals Established by Pt Goals  Drink 2 glasses of almond milk per day Walk 30 minutes or more each day Increase fruits and vegetables Avoid processed foods-mac/cheese and ramen noodles Keep drinking water- a gallon per day. MONITORING & EVALUATION Dietary intake, weekly physical activity, and weight in PRN.  Next Steps  Patient is to work on meal planning and cutting out processed foods.Marland Kitchen

## 2023-03-04 NOTE — Patient Instructions (Signed)
Goals  Drink 2 glasses of almond milk per day Walk 30 minutes or more each day Increase fruits and vegetables Avoid processed foods-mac/cheese and ramen noodles Keep drinking water- a gallon per day.

## 2023-03-05 ENCOUNTER — Encounter: Payer: Self-pay | Admitting: Family Medicine

## 2023-03-05 ENCOUNTER — Ambulatory Visit (INDEPENDENT_AMBULATORY_CARE_PROVIDER_SITE_OTHER): Payer: Medicaid Other | Admitting: Family Medicine

## 2023-03-05 ENCOUNTER — Other Ambulatory Visit: Payer: Self-pay | Admitting: Obstetrics & Gynecology

## 2023-03-05 ENCOUNTER — Encounter: Payer: Self-pay | Admitting: Nutrition

## 2023-03-05 VITALS — BP 121/88 | HR 122 | Ht 65.0 in | Wt 228.1 lb

## 2023-03-05 DIAGNOSIS — R7301 Impaired fasting glucose: Secondary | ICD-10-CM

## 2023-03-05 DIAGNOSIS — Z3A01 Less than 8 weeks gestation of pregnancy: Secondary | ICD-10-CM

## 2023-03-05 DIAGNOSIS — E559 Vitamin D deficiency, unspecified: Secondary | ICD-10-CM | POA: Diagnosis not present

## 2023-03-05 DIAGNOSIS — E038 Other specified hypothyroidism: Secondary | ICD-10-CM

## 2023-03-05 DIAGNOSIS — E7849 Other hyperlipidemia: Secondary | ICD-10-CM

## 2023-03-05 DIAGNOSIS — O219 Vomiting of pregnancy, unspecified: Secondary | ICD-10-CM

## 2023-03-05 DIAGNOSIS — O3680X0 Pregnancy with inconclusive fetal viability, not applicable or unspecified: Secondary | ICD-10-CM

## 2023-03-05 NOTE — Progress Notes (Signed)
Established Patient Office Visit  Subjective:  Patient ID: Julie Case, female    DOB: [birth year]  Age: 32 y.o. MRN: [id number]  CC:  Chief Complaint  Patient presents with   Care Management    Follow up appt    HPI Julie Case is a 32 y.o. female with past medical history of obesity,  presents for f/u of  chronic medical conditions.  The patient had a positive pregnancy test on 02/20/2023.  She complains of increased nausea i currently n her first trimester.  Past Medical History:  Diagnosis Date   Depression    Epilepsy (HCC)     Past Surgical History:  Procedure Laterality Date   CHOLECYSTECTOMY     ECTOPIC PREGNANCY SURGERY     with right tube removal    Family History  Problem Relation Age of Onset   Diabetes Maternal Grandmother    Diabetes Maternal Grandfather    Hypertension Father    Hypercholesterolemia Father    Hypertension Mother    Hypercholesterolemia Mother    Schizophrenia Brother    Hypertension Other    Diabetes Other     Social History   Socioeconomic History   Marital status: Single    Spouse name: Not on file   Number of children: Not on file   Years of education: Not on file   Highest education level: 12th grade  Occupational History   Not on file  Tobacco Use   Smoking status: Every Day    Current packs/day: 0.50    Types: Cigarettes   Smokeless tobacco: Never   Tobacco comments:    1/2 pack a day  Vaping Use   Vaping status: Former  Substance and Sexual Activity   Alcohol use: Not Currently   Drug use: Not Currently   Sexual activity: Yes    Birth control/protection: None  Other Topics Concern   Not on file  Social History Narrative   Not on file   Social Determinants of Health   Financial Resource Strain: Low Risk  (11/19/2022)   Overall Financial Resource Strain (CARDIA)    Difficulty of Paying Living Expenses: Not very hard  Food Insecurity: No Food Insecurity (11/19/2022)   Hunger Vital Sign    Worried About Running  Out of Food in the Last Year: Never true    Ran Out of Food in the Last Year: Never true  Transportation Needs: No Transportation Needs (11/19/2022)   PRAPARE - Administrator, Civil Service (Medical): No    Lack of Transportation (Non-Medical): No  Physical Activity: Insufficiently Active (11/19/2022)   Exercise Vital Sign    Days of Exercise per Week: 3 days    Minutes of Exercise per Session: 30 min  Stress: Stress Concern Present (11/19/2022)   [employer] of Occupational Health - Occupational Stress Questionnaire    Feeling of Stress : Rather much  Social Connections: Moderately Integrated (11/19/2022)   Social Connection and Isolation Panel [NHANES]    Frequency of Communication with Friends and Family: Three times a week    Frequency of Social Gatherings with Friends and Family: Once a week    Attends Religious Services: 1 to 4 times per year    Active Member of [employer] or Organizations: No    Attends Engineer, structural: Not on file    Marital Status: Living with partner  Intimate Partner Violence: Not on file    Outpatient Medications Prior to Visit  Medication Sig Dispense Refill  fluticasone (FLONASE) 50 MCG/ACT nasal spray Place 2 sprays into both nostrils daily. 16 g 2   ondansetron (ZOFRAN) 4 MG tablet Take 1 tablet (4 mg total) by mouth every 6 (six) hours. 12 tablet 0   Prenatal Vit-Fe Fumarate-FA (PRENATAL VITAMIN PO) Take by mouth.     Vitamin D, Ergocalciferol, (DRISDOL) 1.25 MG (50000 UNIT) CAPS capsule Take 1 capsule (50,000 Units total) by mouth every 7 (seven) days. 20 capsule 1   promethazine (PHENERGAN) 12.5 MG tablet Take 1 tablet (12.5 mg total) by mouth every 6 (six) hours as needed. 30 tablet 1   No facility-administered medications prior to visit.    Allergies  Allergen Reactions   Penicillins Shortness Of Breath and Rash   Naproxen Nausea And Vomiting    ROS Review of Systems  Constitutional:  Negative for chills and  fever.  Eyes:  Negative for visual disturbance.  Respiratory:  Negative for chest tightness and shortness of breath.   Gastrointestinal:  Positive for nausea.  Neurological:  Negative for dizziness and headaches.      Objective:    Physical Exam HENT:     Head: Normocephalic.     Mouth/Throat:     Mouth: Mucous membranes are moist.  Cardiovascular:     Rate and Rhythm: Normal rate.     Heart sounds: Normal heart sounds.  Pulmonary:     Effort: Pulmonary effort is normal.     Breath sounds: Normal breath sounds.  Neurological:     Mental Status: She is alert.     BP 121/88   Pulse (!) 122   Ht 5\' 5"  (1.651 m)   Wt 228 lb 1.9 oz (103.5 kg)   LMP 01/13/2023   SpO2 97%   BMI 37.96 kg/m  Wt Readings from Last 3 Encounters:  03/05/23 228 lb 1.9 oz (103.5 kg)  03/04/23 232 lb (105.2 kg)  02/20/23 234 lb (106.1 kg)    Lab Results  Component Value Date   TSH 2.000 10/23/2022   Lab Results  Component Value Date   WBC 6.0 01/20/2023   HGB 14.4 01/20/2023   HCT 41.5 01/20/2023   MCV 83.7 01/20/2023   PLT 255 01/20/2023   Lab Results  Component Value Date   NA 138 01/20/2023   K 3.6 01/20/2023   CO2 24 01/20/2023   GLUCOSE 101 (H) 01/20/2023   BUN 7 01/20/2023   CREATININE 0.58 01/20/2023   BILITOT 0.9 09/04/2022   ALKPHOS 87 09/04/2022   AST 17 09/04/2022   ALT 20 09/04/2022   PROT 7.6 09/04/2022   ALBUMIN 4.3 09/04/2022   CALCIUM 9.0 01/20/2023   ANIONGAP 9 01/20/2023   EGFR 119 07/17/2022   Lab Results  Component Value Date   CHOL 193 10/23/2022   Lab Results  Component Value Date   HDL 34 (L) 10/23/2022   Lab Results  Component Value Date   LDLCALC 136 (H) 10/23/2022   Lab Results  Component Value Date   TRIG 126 10/23/2022   Lab Results  Component Value Date   CHOLHDL 5.7 (H) 10/23/2022   Lab Results  Component Value Date   HGBA1C 5.2 10/23/2022      Assessment & Plan:  Less than [redacted] weeks gestation of pregnancy Assessment &  Plan: Positive pregnancy test on 02/20/23 Encouraged to continue following up gyn   Nausea and vomiting in pregnancy Assessment & Plan: Non-pharmacological interventions for managing nausea during pregnancy, often referred to as "morning sickness," focus on dietary adjustments,  lifestyle changes, and natural remedies. Here are some effective strategies:  Dietary Adjustments: Small, Frequent Meals: Eating small meals throughout the day rather than large ones can help keep the stomach from becoming too full, which can exacerbate nausea. Avoid Trigger Foods: Identify and avoid foods that trigger nausea. Common triggers include spicy, fatty, or greasy foods. Eat Bland, Dry Foods: Foods like crackers, toast, or dry cereals can help absorb stomach acid and are easier to tolerate. Stay Hydrated: Sip water, herbal teas, or clear broths throughout the day. Some women find cold, carbonated drinks like ginger ale helpful, as long as they are caffeine-free. Ginger: Ginger is a natural remedy often recommended for nausea. You can try ginger tea, ginger ale (with real ginger), ginger candies, or ginger supplements. Eat Protein-Rich Snacks: Keeping protein in your diet, like nuts or cheese, can help stabilize blood sugar levels, which may reduce nausea. 2. Timing and Positioning: Eat Before Getting Out of Bed: Keeping some crackers or dry toast by your bed and eating a small amount before getting up can help prevent morning nausea. Avoid Lying Down After Eating: Sit upright or stand for a while after eating to help food settle and reduce the risk of acid reflux, which can contribute to nausea. Sleep with Your Head Elevated: Elevating your head while sleeping can help prevent acid reflux, which may exacerbate nausea. 3. Environmental Adjustments Avoid Strong Smells: Pregnant women often have heightened sensitivity to smells, which can trigger nausea. Avoiding strong odors, such as cooking smells, perfumes, or  smoke, may help. Keep Your Environment Cool: A cool, well-ventilated environment can reduce the likelihood of feeling overheated, which can worsen nausea.    IFG (impaired fasting glucose) -     Hemoglobin A1c  Vitamin D deficiency -     VITAMIN D 25 Hydroxy (Vit-D Deficiency, Fractures)  Other specified hypothyroidism -     TSH + free T4  Other hyperlipidemia -     Lipid panel -     CMP14+EGFR -     CBC with Differential/Platelet  Note: This chart has been completed using Engineer, civil (consulting) software, and while attempts have been made to ensure accuracy, certain words and phrases may not be transcribed as intended.    Follow-up: Return in about 5 months (around 08/05/2023).   Gilmore Laroche, FNP

## 2023-03-05 NOTE — Assessment & Plan Note (Signed)
Non-pharmacological interventions for managing nausea during pregnancy, often referred to as "morning sickness," focus on dietary adjustments, lifestyle changes, and natural remedies. Here are some effective strategies:  Dietary Adjustments: Small, Frequent Meals: Eating small meals throughout the day rather than large ones can help keep the stomach from becoming too full, which can exacerbate nausea. Avoid Trigger Foods: Identify and avoid foods that trigger nausea. Common triggers include spicy, fatty, or greasy foods. Eat Bland, Dry Foods: Foods like crackers, toast, or dry cereals can help absorb stomach acid and are easier to tolerate. Stay Hydrated: Sip water, herbal teas, or clear broths throughout the day. Some women find cold, carbonated drinks like ginger ale helpful, as long as they are caffeine-free. Ginger: Ginger is a natural remedy often recommended for nausea. You can try ginger tea, ginger ale (with real ginger), ginger candies, or ginger supplements. Eat Protein-Rich Snacks: Keeping protein in your diet, like nuts or cheese, can help stabilize blood sugar levels, which may reduce nausea. 2. Timing and Positioning: Eat Before Getting Out of Bed: Keeping some crackers or dry toast by your bed and eating a small amount before getting up can help prevent morning nausea. Avoid Lying Down After Eating: Sit upright or stand for a while after eating to help food settle and reduce the risk of acid reflux, which can contribute to nausea. Sleep with Your Head Elevated: Elevating your head while sleeping can help prevent acid reflux, which may exacerbate nausea. 3. Environmental Adjustments Avoid Strong Smells: Pregnant women often have heightened sensitivity to smells, which can trigger nausea. Avoiding strong odors, such as cooking smells, perfumes, or smoke, may help. Keep Your Environment Cool: A cool, well-ventilated environment can reduce the likelihood of feeling overheated, which can  worsen nausea.

## 2023-03-05 NOTE — Assessment & Plan Note (Signed)
Positive pregnancy test on 02/20/23 Encouraged to continue following up gyn

## 2023-03-05 NOTE — Patient Instructions (Addendum)
I appreciate the opportunity to provide care to you today!    Follow up:  5 months  Labs: please stop by the lab during the week to get your blood drawn (CBC, CMP, TSH, Lipid profile, HgA1c, Vit D)  Non-pharmacological interventions for managing nausea during pregnancy, often referred to as "morning sickness," focus on dietary adjustments, lifestyle changes, and natural remedies. Here are some effective strategies:  Dietary Adjustments: Small, Frequent Meals: Eating small meals throughout the day rather than large ones can help keep the stomach from becoming too full, which can exacerbate nausea. Avoid Trigger Foods: Identify and avoid foods that trigger nausea. Common triggers include spicy, fatty, or greasy foods. Eat Bland, Dry Foods: Foods like crackers, toast, or dry cereals can help absorb stomach acid and are easier to tolerate. Stay Hydrated: Sip water, herbal teas, or clear broths throughout the day. Some women find cold, carbonated drinks like ginger ale helpful, as long as they are caffeine-free. Ginger: Ginger is a natural remedy often recommended for nausea. You can try ginger tea, ginger ale (with real ginger), ginger candies, or ginger supplements. Eat Protein-Rich Snacks: Keeping protein in your diet, like nuts or cheese, can help stabilize blood sugar levels, which may reduce nausea. 2. Timing and Positioning: Eat Before Getting Out of Bed: Keeping some crackers or dry toast by your bed and eating a small amount before getting up can help prevent morning nausea. Avoid Lying Down After Eating: Sit upright or stand for a while after eating to help food settle and reduce the risk of acid reflux, which can contribute to nausea. Sleep with Your Head Elevated: Elevating your head while sleeping can help prevent acid reflux, which may exacerbate nausea. 3. Environmental Adjustments Avoid Strong Smells: Pregnant women often have heightened sensitivity to smells, which can trigger  nausea. Avoiding strong odors, such as cooking smells, perfumes, or smoke, may help. Keep Your Environment Cool: A cool, well-ventilated environment can reduce the likelihood of feeling overheated, which can worsen nausea.    Please continue to a heart-healthy diet and increase your physical activities. Try to exercise for at least five days a week.    It was a pleasure to see you and I look forward to continuing to work together on your health and well-being. Please do not hesitate to call the office if you need care or have questions about your care.  In case of emergency, please visit the Emergency Department for urgent care, or contact our clinic at [phone] to schedule an appointment. We're here to help you!   Have a wonderful day and week. With Gratitude, Gilmore Laroche MSN, FNP-BC

## 2023-03-06 ENCOUNTER — Ambulatory Visit (INDEPENDENT_AMBULATORY_CARE_PROVIDER_SITE_OTHER): Payer: Medicaid Other

## 2023-03-06 DIAGNOSIS — Z3A01 Less than 8 weeks gestation of pregnancy: Secondary | ICD-10-CM | POA: Diagnosis not present

## 2023-03-06 DIAGNOSIS — Z3481 Encounter for supervision of other normal pregnancy, first trimester: Secondary | ICD-10-CM

## 2023-03-06 DIAGNOSIS — O3680X0 Pregnancy with inconclusive fetal viability, not applicable or unspecified: Secondary | ICD-10-CM

## 2023-03-06 NOTE — Progress Notes (Signed)
[place] 7+1 wks,single IUP with yolk sac,normal right ovary,simple left ovarian cyst 4 x 3.7 x 4.1 cm,CRL 10.96 mm,FHR 140 bpm

## 2023-03-14 DIAGNOSIS — E038 Other specified hypothyroidism: Secondary | ICD-10-CM | POA: Diagnosis not present

## 2023-03-14 DIAGNOSIS — E559 Vitamin D deficiency, unspecified: Secondary | ICD-10-CM | POA: Diagnosis not present

## 2023-03-14 DIAGNOSIS — E7849 Other hyperlipidemia: Secondary | ICD-10-CM | POA: Diagnosis not present

## 2023-03-14 DIAGNOSIS — R7301 Impaired fasting glucose: Secondary | ICD-10-CM | POA: Diagnosis not present

## 2023-03-15 LAB — CBC WITH DIFFERENTIAL/PLATELET
Lymphocytes Absolute: 1.7 10*3/uL (ref 0.7–3.1)
Neutrophils Absolute: 6 10*3/uL (ref 1.4–7.0)
RDW: 13 % (ref 11.7–15.4)

## 2023-04-03 ENCOUNTER — Other Ambulatory Visit: Payer: Self-pay

## 2023-04-03 ENCOUNTER — Inpatient Hospital Stay (HOSPITAL_COMMUNITY): Payer: Medicaid Other

## 2023-04-03 ENCOUNTER — Emergency Department (HOSPITAL_COMMUNITY)
Admission: EM | Admit: 2023-04-03 | Discharge: 2023-04-03 | Disposition: A | Discharge status: Home / Self Care | Payer: Medicaid Other | Attending: Emergency Medicine | Admitting: Emergency Medicine

## 2023-04-03 ENCOUNTER — Encounter (HOSPITAL_COMMUNITY): Payer: Self-pay

## 2023-04-03 ENCOUNTER — Encounter (HOSPITAL_COMMUNITY): Payer: Self-pay | Admitting: Obstetrics and Gynecology

## 2023-04-03 ENCOUNTER — Inpatient Hospital Stay (HOSPITAL_COMMUNITY)
Admission: AD | Admit: 2023-04-03 | Discharge: 2023-04-03 | Disposition: A | Discharge status: Home / Self Care | Payer: Medicaid Other | Attending: Obstetrics and Gynecology | Admitting: Obstetrics and Gynecology

## 2023-04-03 DIAGNOSIS — O208 Other hemorrhage in early pregnancy: Secondary | ICD-10-CM | POA: Insufficient documentation

## 2023-04-03 DIAGNOSIS — R1030 Lower abdominal pain, unspecified: Secondary | ICD-10-CM | POA: Diagnosis present

## 2023-04-03 DIAGNOSIS — F17210 Nicotine dependence, cigarettes, uncomplicated: Secondary | ICD-10-CM | POA: Insufficient documentation

## 2023-04-03 DIAGNOSIS — O99351 Diseases of the nervous system complicating pregnancy, first trimester: Secondary | ICD-10-CM | POA: Insufficient documentation

## 2023-04-03 DIAGNOSIS — Z5321 Procedure and treatment not carried out due to patient leaving prior to being seen by health care provider: Secondary | ICD-10-CM | POA: Insufficient documentation

## 2023-04-03 DIAGNOSIS — Z3A11 11 weeks gestation of pregnancy: Secondary | ICD-10-CM | POA: Insufficient documentation

## 2023-04-03 DIAGNOSIS — G43809 Other migraine, not intractable, without status migrainosus: Secondary | ICD-10-CM | POA: Insufficient documentation

## 2023-04-03 DIAGNOSIS — O99331 Smoking (tobacco) complicating pregnancy, first trimester: Secondary | ICD-10-CM | POA: Insufficient documentation

## 2023-04-03 DIAGNOSIS — N938 Other specified abnormal uterine and vaginal bleeding: Secondary | ICD-10-CM | POA: Diagnosis not present

## 2023-04-03 DIAGNOSIS — O209 Hemorrhage in early pregnancy, unspecified: Secondary | ICD-10-CM

## 2023-04-03 LAB — CBC
HCT: 36.4 % (ref 36.0–46.0)
Hemoglobin: 13 g/dL (ref 12.0–15.0)
MCH: 29.3 pg (ref 26.0–34.0)
MCHC: 35.7 g/dL (ref 30.0–36.0)
MCV: 82 fL (ref 80.0–100.0)
Platelets: 230 10*3/uL (ref 150–400)
RBC: 4.44 MIL/uL (ref 3.87–5.11)
RDW: 12.7 % (ref 11.5–15.5)
WBC: 10.7 10*3/uL — ABNORMAL HIGH (ref 4.0–10.5)
nRBC: 0 % (ref 0.0–0.2)

## 2023-04-03 LAB — URINALYSIS, ROUTINE W REFLEX MICROSCOPIC
Bilirubin Urine: NEGATIVE
Glucose, UA: NEGATIVE mg/dL
Ketones, ur: NEGATIVE mg/dL
Leukocytes,Ua: NEGATIVE
Nitrite: NEGATIVE
Protein, ur: NEGATIVE mg/dL
RBC / HPF: >50 RBC/hpf (ref 0–5)
Specific Gravity, Urine: 1.012 (ref 1.005–1.030)
pH: 6 (ref 5.0–8.0)

## 2023-04-03 LAB — WET PREP, GENITAL
Sperm: NONE SEEN
Trich, Wet Prep: NONE SEEN
WBC, Wet Prep HPF POC: <10 (ref <10)
Yeast Wet Prep HPF POC: NONE SEEN

## 2023-04-03 MED ORDER — ACETAMINOPHEN-CAFFEINE 500-65 MG PO TABS: 2.0000 | ORAL_TABLET | Freq: Once | ORAL | Status: DC

## 2023-04-03 MED ORDER — ACETAMINOPHEN-CAFFEINE 500-65 MG PO TABS
2.0000 | ORAL_TABLET | Freq: Once | ORAL | Status: AC
  Administered 2023-04-03: 2 via ORAL
  Filled 2023-04-03: qty 2

## 2023-04-03 NOTE — MAU Note (Signed)
..  Julie Case is a 32 y.o. at [redacted]w[redacted]d here in MAU reporting: pelvic and vaginal pain that began around 4 pm and has intensified. Reports vaginal bleeding that began around the same time the pain did. Reports she has had to change her pad twice since it began.   Pain score: 10/10 Vitals:   04/03/23 2019  BP: (!) 111/58  Pulse: 89  Resp: 20  Temp: 98.7 F (37.1 C)  SpO2: 100%     FHT: unable to doppler in triage Lab orders placed from triage:  already in

## 2023-04-03 NOTE — ED Notes (Signed)
[facility] reports patient is leaving. Advised patient to stay and patient refuses

## 2023-04-03 NOTE — MAU Provider Note (Incomplete)
Chief Complaint: Vaginal Bleeding and Pelvic Pain   Event Date/Time   First Provider Initiated Contact with Patient 04/03/23 2153        SUBJECTIVE HPI: Julie Case is a 32 y.o. [id number] at [redacted]w[redacted]d by LMP who presents to maternity admissions reporting vaginal bleeding and pelvic pain.  Started at 4pm.  Has gotten worse since then. Also has a headache. She denies urinary symptoms, h/a, dizziness, n/v, or fever/chills.    Vaginal Bleeding The patient's primary symptoms include pelvic pain and vaginal bleeding. The patient's pertinent negatives include no genital itching or genital odor. The current episode started today. She is pregnant. Associated symptoms include abdominal pain. Pertinent negatives include no chills, dysuria or fever. The vaginal discharge was bloody. She has not been passing clots. She has not been passing tissue. Nothing aggravates the symptoms. She has tried nothing for the symptoms.  Pelvic Pain The patient's primary symptoms include pelvic pain and vaginal bleeding. The patient's pertinent negatives include no genital itching or genital odor. The current episode started today. She is pregnant. Associated symptoms include abdominal pain. Pertinent negatives include no chills, dysuria or fever. The vaginal discharge was bloody. She has not been passing clots. She has not been passing tissue. Nothing aggravates the symptoms. She has tried nothing for the symptoms.   RN Note: .Julie Case is a 32 y.o. at [redacted]w[redacted]d here in MAU reporting: pelvic and vaginal pain that began around 4 pm and has intensified. Reports vaginal bleeding that began around the same time the pain did. Reports she has had to change her pad twice since it began.   Pain score: 10/10  Past Medical History:  Diagnosis Date   Depression    Epilepsy (HCC)    Past Surgical History:  Procedure Laterality Date   CHOLECYSTECTOMY     ECTOPIC PREGNANCY SURGERY     with right tube removal   Social History    Socioeconomic History   Marital status: Single    Spouse name: Not on file   Number of children: Not on file   Years of education: Not on file   Highest education level: 12th grade  Occupational History   Not on file  Tobacco Use   Smoking status: Every Day    Current packs/day: 0.50    Types: Cigarettes   Smokeless tobacco: Never   Tobacco comments:    1/2 pack a day  Vaping Use   Vaping status: Former  Substance and Sexual Activity   Alcohol use: Not Currently   Drug use: Not Currently   Sexual activity: Yes    Birth control/protection: None  Other Topics Concern   Not on file  Social History Narrative   Not on file   Social Determinants of Health   Financial Resource Strain: Low Risk  (11/19/2022)   Overall Financial Resource Strain (CARDIA)    Difficulty of Paying Living Expenses: Not very hard  Food Insecurity: No Food Insecurity (11/19/2022)   Hunger Vital Sign    Worried About Running Out of Food in the Last Year: Never true    Ran Out of Food in the Last Year: Never true  Transportation Needs: No Transportation Needs (11/19/2022)   PRAPARE - Administrator, Civil Service (Medical): No    Lack of Transportation (Non-Medical): No  Physical Activity: Insufficiently Active (11/19/2022)   Exercise Vital Sign    Days of Exercise per Week: 3 days    Minutes of Exercise per Session: 30 min  Stress:  Stress Concern Present (11/19/2022)   [employer] of Occupational Health - Occupational Stress Questionnaire    Feeling of Stress : Rather much  Social Connections: Moderately Integrated (11/19/2022)   Social Connection and Isolation Panel [NHANES]    Frequency of Communication with Friends and Family: Three times a week    Frequency of Social Gatherings with Friends and Family: Once a week    Attends Religious Services: 1 to 4 times per year    Active Member of [employer] or Organizations: No    Attends Engineer, structural: Not on file    Marital  Status: Living with partner  Intimate Partner Violence: Not on file   No current facility-administered medications on file prior to encounter.   Current Outpatient Medications on File Prior to Encounter  Medication Sig Dispense Refill   fluticasone (FLONASE) 50 MCG/ACT nasal spray Place 2 sprays into both nostrils daily. 16 g 2   ondansetron (ZOFRAN) 4 MG tablet Take 1 tablet (4 mg total) by mouth every 6 (six) hours. 12 tablet 0   Prenatal Vit-Fe Fumarate-FA (PRENATAL VITAMIN PO) Take by mouth.     Vitamin D, Ergocalciferol, (DRISDOL) 1.25 MG (50000 UNIT) CAPS capsule Take 1 capsule (50,000 Units total) by mouth every 7 (seven) days. 20 capsule 1   Allergies  Allergen Reactions   Penicillins Shortness Of Breath and Rash   Naproxen Nausea And Vomiting    I have reviewed patient's Past Medical Hx, Surgical Hx, Family Hx, Social Hx, medications and allergies.   ROS:  Review of Systems  Constitutional:  Negative for chills and fever.  Gastrointestinal:  Positive for abdominal pain.  Genitourinary:  Positive for pelvic pain and vaginal bleeding. Negative for dysuria.   Review of Systems  Other systems negative   Physical Exam  Physical Exam Patient Vitals for the past 24 hrs:  BP Temp Temp src Pulse Resp SpO2 Height Weight  04/03/23 2019 (!) 111/58 98.7 F (37.1 C) Oral 89 20 100 % -- --  04/03/23 2013 -- -- -- -- -- -- 5\' 5"  (1.651 m) 98.9 kg   Constitutional: Well-developed, well-nourished female in no acute distress.  Cardiovascular: normal rate Respiratory: normal effort GI: Abd soft, non-tender.  MS: Extremities nontender, no edema, normal ROM Neurologic: Alert and oriented x 4.  GU: Neg CVAT.  PELVIC EXAM: deferred in lieu of ultrasound  LAB RESULTS Results for orders placed or performed during the hospital encounter of 04/03/23 (from the past 24 hour(s))  Wet prep, genital     Status: Abnormal   Collection Time: 04/03/23  6:21 PM   Specimen: PATH Cytology  Cervicovaginal Ancillary Only  Result Value Ref Range   Yeast Wet Prep HPF POC NONE SEEN NONE SEEN   Trich, Wet Prep NONE SEEN NONE SEEN   Clue Cells Wet Prep HPF POC PRESENT (A) NONE SEEN   WBC, Wet Prep HPF POC <10 <10   Sperm NONE SEEN   Urinalysis, Routine w reflex microscopic -Urine, Clean Catch     Status: Abnormal   Collection Time: 04/03/23  6:28 PM  Result Value Ref Range   Color, Urine YELLOW YELLOW   APPearance HAZY (A) CLEAR   Specific Gravity, Urine 1.012 1.005 - 1.030   pH 6.0 5.0 - 8.0   Glucose, UA NEGATIVE NEGATIVE mg/dL   Hgb urine dipstick LARGE (A) NEGATIVE   Bilirubin Urine NEGATIVE NEGATIVE   Ketones, ur NEGATIVE NEGATIVE mg/dL   Protein, ur NEGATIVE NEGATIVE mg/dL   Nitrite NEGATIVE  NEGATIVE   Leukocytes,Ua NEGATIVE NEGATIVE   RBC / HPF >50 0 - 5 RBC/hpf   WBC, UA 0-5 0 - 5 WBC/hpf   Bacteria, UA RARE (A) NONE SEEN   Squamous Epithelial / HPF 0-5 0 - 5 /HPF   Mucus PRESENT   CBC     Status: Abnormal   Collection Time: 04/03/23  7:44 PM  Result Value Ref Range   WBC 10.7 (H) 4.0 - 10.5 K/uL   RBC 4.44 3.87 - 5.11 MIL/uL   Hemoglobin 13.0 12.0 - 15.0 g/dL   HCT 08.6 57.8 - 46.9 %   MCV 82.0 80.0 - 100.0 fL   MCH 29.3 26.0 - 34.0 pg   MCHC 35.7 30.0 - 36.0 g/dL   RDW 62.9 52.8 - 41.3 %   Platelets 230 150 - 400 K/uL   nRBC 0.0 0.0 - 0.2 %    --/--/O POS (11/22 1048)  IMAGING US OB Comp Less 14 Wks  Result Date: 04/03/2023 CLINICAL DATA:  244010 Bleeding in early pregnancy [id number] EXAM: OBSTETRIC <14 WK ULTRASOUND TECHNIQUE: Transabdominal ultrasound was performed for evaluation of the gestation as well as the maternal uterus and adnexal regions. COMPARISON:  None Available. FINDINGS: Intrauterine gestational sac: Single Yolk sac:  Visualized. Embryo:  Visualized. Cardiac Activity: Visualized. Heart Rate: 167 bpm CRL: 46.5  mm   11 w 3 d                  [place] EDC: 10/20/23 Subchorionic hemorrhage:  No definite subchorionic hemorrhage seen. Maternal  uterus/adnexae: Note is made of a 3.3 x 3.5 x 4.0 cm simple cyst in the left ovary. IMPRESSION: *Limited transabdominal exam. *Single live intrauterine pregnancy with crown-rump length of 11 weeks 3 days. Electronically Signed   By: Jules Schick M.D.   On: 04/03/2023 20:50     MAU Management/MDM: I have reviewed the triage vital signs and the nursing notes.   Pertinent labs & imaging results that were available during my care of the patient were reviewed by me and considered in my medical decision making (see chart for details).      I have reviewed her medical records including past results, notes and treatments. Medical, Surgical, and family history were reviewed.  Medications and recent lab tests were reviewed  Ordered Ultrasound to rule out SAB.   Discussed findings of [place].  No definite SCH seen by Radiologist, but discussed there may be one we cannot visualize given type of bleeding she had.  The good news is that the baby is still alive and hopefully the bleeding will resolve over time.  Excedrin Tension given.  ASSESSMENT Single IUP at [redacted]w[redacted]d Vaginal bleeding Probable Subchorionic hemorrhage.   PLAN Discharge home Pelvic rest Bleeding precautions Followup with prenatal care as scheduled  Pt stable at time of discharge. Encouraged to return here if she develops worsening of symptoms, increase in pain, fever, or other concerning symptoms.    Wynelle Bourgeois CNM, MSN Certified Nurse-Midwife 04/03/2023  9:53 PM

## 2023-04-03 NOTE — ED Triage Notes (Signed)
C/o lower abd pain with vaginal bleeding.  Pt reports dizziness and lightheaded upon standing.  Currently [redacted]W[redacted]D  [id number]

## 2023-04-04 LAB — GC/CHLAMYDIA PROBE AMP (~~LOC~~) NOT AT ARMC
Chlamydia: NEGATIVE
Comment: NEGATIVE
Comment: NORMAL
Neisseria Gonorrhea: NEGATIVE

## 2023-04-16 ENCOUNTER — Other Ambulatory Visit: Payer: Self-pay | Admitting: Obstetrics & Gynecology

## 2023-04-16 DIAGNOSIS — Z3682 Encounter for antenatal screening for nuchal translucency: Secondary | ICD-10-CM

## 2023-04-17 ENCOUNTER — Ambulatory Visit (INDEPENDENT_AMBULATORY_CARE_PROVIDER_SITE_OTHER): Payer: Medicaid Other | Admitting: Advanced Practice Midwife

## 2023-04-17 ENCOUNTER — Encounter: Payer: Self-pay | Admitting: Advanced Practice Midwife

## 2023-04-17 ENCOUNTER — Ambulatory Visit (INDEPENDENT_AMBULATORY_CARE_PROVIDER_SITE_OTHER): Payer: Medicaid Other

## 2023-04-17 ENCOUNTER — Encounter: Payer: Medicaid Other | Admitting: *Deleted

## 2023-04-17 VITALS — BP 131/88 | HR 99 | Wt 225.0 lb

## 2023-04-17 DIAGNOSIS — Z3490 Encounter for supervision of normal pregnancy, unspecified, unspecified trimester: Secondary | ICD-10-CM | POA: Insufficient documentation

## 2023-04-17 DIAGNOSIS — Z3A13 13 weeks gestation of pregnancy: Secondary | ICD-10-CM

## 2023-04-17 DIAGNOSIS — Z8669 Personal history of other diseases of the nervous system and sense organs: Secondary | ICD-10-CM

## 2023-04-17 DIAGNOSIS — Z3682 Encounter for antenatal screening for nuchal translucency: Secondary | ICD-10-CM | POA: Diagnosis not present

## 2023-04-17 DIAGNOSIS — Z3481 Encounter for supervision of other normal pregnancy, first trimester: Secondary | ICD-10-CM | POA: Diagnosis not present

## 2023-04-17 DIAGNOSIS — Z363 Encounter for antenatal screening for malformations: Secondary | ICD-10-CM

## 2023-04-17 DIAGNOSIS — Z3480 Encounter for supervision of other normal pregnancy, unspecified trimester: Secondary | ICD-10-CM

## 2023-04-17 MED ORDER — BLOOD PRESSURE MONITOR MISC
0 refills | Status: DC
Start: 2023-04-17 — End: 2023-12-19

## 2023-04-17 NOTE — Progress Notes (Signed)
INITIAL OBSTETRICAL VISIT Patient name: Georgeanne Frome MRN [id number]  Date of birth: 1991-06-25 Chief Complaint:   Initial Prenatal Visit  History of Present Illness:   Haizlee Dauphinee is a 32 y.o. [id number] Caucasian female at [redacted]w[redacted]d by LMP c/w u/s at 7.1 weeks with an Estimated Date of Delivery: 10/22/23 being seen today for her initial obstetrical visit.   Patient's last menstrual period was 01/15/2023 (exact date). Her obstetrical history is significant for  term SVD x 5; ectopic 2022; SAB x 2 in 2023 .   Today she reports no complaints.  Last pap July 2023. Results were: NILM w/ HRHPV negative     04/17/2023    2:01 PM 03/05/2023    2:16 PM 01/28/2023    2:40 PM 01/28/2023    2:24 PM 12/27/2022    2:25 PM  Depression screen PHQ 2/9  Decreased Interest 0 0 0 0 0  Down, Depressed, Hopeless 1 0 0 0 0  PHQ - 2 Score 1 0 0 0 0  Altered sleeping 1 0 0 0   Tired, decreased energy 1 0 2 0   Change in appetite 1 0 1 0   Feeling bad or failure about yourself  0 0 0 0   Trouble concentrating 0 0 0 0   Moving slowly or fidgety/restless 0 0 0 0   Suicidal thoughts 0 0 0 0   PHQ-9 Score 4 0 3 0   Difficult doing work/chores  Not difficult at all Not difficult at all Not difficult at all         04/17/2023    2:01 PM 03/05/2023    2:16 PM 01/28/2023    2:40 PM 01/28/2023    2:24 PM  GAD 7 : Generalized Anxiety Score  Nervous, Anxious, on Edge 0 0 0 0  Control/stop worrying 0 0 1 0  Worry too much - different things 0 0 0 0  Trouble relaxing 0 0 1 0  Restless 0 0 0 0  Easily annoyed or irritable 0 0 0 0  Afraid - awful might happen 0 0 0 0  Total GAD 7 Score 0 0 2 0  Anxiety Difficulty  Not difficult at all Somewhat difficult Not difficult at all     Review of Systems:   Pertinent items are noted in HPI Denies cramping/contractions, leakage of fluid, vaginal bleeding, abnormal vaginal discharge w/ itching/odor/irritation, headaches, visual changes, shortness of breath, chest pain, abdominal pain,  severe nausea/vomiting, or problems with urination or bowel movements unless otherwise stated above.  Pertinent History Reviewed:  Reviewed past medical,surgical, social, obstetrical and family history.  Reviewed problem list, medications and allergies. OB History  Gravida Para Term Preterm AB Living  9 5 5  0 3 5  SAB IAB Ectopic Multiple Live Births  2 0 1 0 5    # Outcome Date GA Lbr Len/2nd Weight Sex Type Anes PTL Lv  9 Current           8 SAB 07/12/22          7 SAB 01/21/22          6 Ectopic 2022     ECTOPIC     5 Term 05/21/17 [redacted]w[redacted]d  6 lb 5 oz (2.863 kg) F Vag-Spont EPI N LIV  4 Term 04/10/16 [redacted]w[redacted]d  7 lb 3 oz (3.26 kg) F Vag-Spont EPI N LIV  3 Term 09/21/11 [redacted]w[redacted]d  7 lb 3 oz (3.26 kg) F Vag-Spont EPI N  LIV  2 Term 11/22/09 [redacted]w[redacted]d  7 lb (3.175 kg) F Vag-Spont EPI N LIV  1 Term 07/26/08 [redacted]w[redacted]d  6 lb 8 oz (2.948 kg) F Vag-Spont EPI N LIV   Physical Assessment:   Vitals:   04/17/23 1409  BP: 131/88  Pulse: 99  Weight: 225 lb (102.1 kg)  Body mass index is 37.44 kg/m.       Physical Examination:  General appearance - well appearing, and in no distress  Mental status - alert, oriented to person, place, and time  Psych:  She has a normal mood and affect  Skin - warm and dry, normal color, no suspicious lesions noted  Chest - effort normal, all lung fields clear to auscultation bilaterally  Heart - normal rate and regular rhythm  Abdomen - soft, nontender  Extremities:  No swelling or varicosities noted  Pelvic - not indicated  Thin prep pap is not done    TODAY'S NT [place] 13+1 wks,measurements c/w dates,FHR 166 bpm,posterior placenta,anterior subchorionic hemorrhage 9.8 x 1.6 x 5.2 cm,normal right ovary,simple left corpus luteal cyst 3.9 x 3.9 x 4.3 cm,NB present,NT 1.9 mm   No results found for this or any previous visit (from the past 24 hour(s)).  Assessment & Plan:  1) Low-Risk Pregnancy [id number] at [redacted]w[redacted]d with an Estimated Date of Delivery: 10/22/23   2) Initial OB  visit  3) SCH, no recent bleeding  4) Remote hx seizures, last one 7 years ago; no meds x 4 years; this happened when she lived in [place], which is where her neurologist is  5) Smoker, down to 1/2 ppd, encouraged her and recommended to continue cutting back  Meds:  Meds ordered this encounter  Medications   Blood Pressure Monitor MISC    Sig: For regular home bp monitoring during pregnancy    Dispense:  1 each    Refill:  0    Z34.81 Please mail to patient    Initial labs obtained Continue prenatal vitamins Reviewed n/v relief measures and warning s/s to report Reviewed recommended weight gain based on pre-gravid BMI Encouraged well-balanced diet Genetic & carrier screening discussed: requests Panorama and NT/IT, requests Horizon  Ultrasound discussed; fetal survey: requested CCNC completed> form faxed if has or is planning to apply for medicaid The nature of [place] [facility] [employer] with multiple MDs and other Advanced Practice Providers was explained to patient; also emphasized that fellows, residents, and students are part of our team. Does not have home bp cuff. Office bp cuff given: no. Rx sent: yes. Check bp weekly, let us know if consistently >140/90.   No indications for ASA therapy or early HgbA1c (per uptodate)   Follow-up: Return for 4wk LROB/2nd IT: 7wk LROB/anatomy u/s.   Orders Placed This Encounter  Procedures   Urine Culture   GC/Chlamydia Probe Amp   US OB Comp + 14 Wk   Integrated 1   CBC/D/Plt+RPR+Rh+ABO+RubIgG...   PANORAMA PRENATAL TEST   HORIZON CUSTOM    Arabella Merles [facility] 04/17/2023 2:43 PM

## 2023-04-17 NOTE — Progress Notes (Signed)
[place] 13+1 wks,measurements c/w dates,FHR 166 bpm,posterior placenta,subchorionic hemorrhage 9.8 x 1.6 x 5.2 cm,normal right ovary,simple left corpus luteal cyst 3.9 x 3.9 x 4.3 cm,NB present,NT 1.9 mm

## 2023-04-18 LAB — INTEGRATED 1

## 2023-04-19 LAB — CBC/D/PLT+RPR+RH+ABO+RUBIGG...
Antibody Screen: NEGATIVE
Basophils Absolute: 0 10*3/uL (ref 0.0–0.2)
Basos: 0 %
EOS (ABSOLUTE): 0.2 10*3/uL (ref 0.0–0.4)
Eos: 2 %
HCV Ab: NONREACTIVE
HIV Screen 4th Generation wRfx: NONREACTIVE
Hematocrit: 38.6 % (ref 34.0–46.6)
Hemoglobin: 12.8 g/dL (ref 11.1–15.9)
Hepatitis B Surface Ag: NEGATIVE
Immature Grans (Abs): 0 10*3/uL (ref 0.0–0.1)
Immature Granulocytes: 0 %
Lymphocytes Absolute: 1.5 10*3/uL (ref 0.7–3.1)
Lymphs: 17 %
MCH: 29.3 pg (ref 26.6–33.0)
MCHC: 33.2 g/dL (ref 31.5–35.7)
MCV: 88 fL (ref 79–97)
Monocytes Absolute: 0.5 10*3/uL (ref 0.1–0.9)
Monocytes: 6 %
Neutrophils Absolute: 6.9 10*3/uL (ref 1.4–7.0)
Neutrophils: 75 %
Platelets: 211 10*3/uL (ref 150–450)
RBC: 4.37 x10E6/uL (ref 3.77–5.28)
RDW: 13.4 % (ref 11.7–15.4)
RPR Ser Ql: NONREACTIVE
Rh Factor: POSITIVE
Rubella Antibodies, IGG: 5.25 index (ref >0.99)
WBC: 9.1 10*3/uL (ref 3.4–10.8)

## 2023-04-19 LAB — URINE CULTURE

## 2023-04-19 LAB — INTEGRATED 1
Crown Rump Length: 74.7 mm
Gest. Age on Collection Date: 13.3 weeks
Maternal Age at EDD: 34.7 yr
Nuchal Translucency (NT): 1.9 mm
Number of Fetuses: 1
PAPP-A Value: 408.9 ng/mL
Sonographer ID#: 309760
Weight: 225 [lb_av]

## 2023-04-19 LAB — HCV INTERPRETATION

## 2023-04-21 LAB — GC/CHLAMYDIA PROBE AMP
Chlamydia trachomatis, NAA: NEGATIVE
Neisseria Gonorrhoeae by PCR: NEGATIVE

## 2023-04-25 LAB — PANORAMA PRENATAL TEST FULL PANEL:PANORAMA TEST PLUS 5 ADDITIONAL MICRODELETIONS: FETAL FRACTION: 3.6

## 2023-04-29 ENCOUNTER — Telehealth: Payer: Self-pay | Admitting: *Deleted

## 2023-04-29 LAB — HORIZON CUSTOM: REPORT SUMMARY: NEGATIVE

## 2023-04-29 NOTE — Telephone Encounter (Signed)
Pt is spotting brown. Started this am. Pt had a subchorionic hemorrhage show up on [place]. Pt was advised it's not uncommon to have spotting off and on when you have this. Pt was advised to take it easy and not lift over 25 lbs. Pt voiced understanding. JSY

## 2023-05-02 ENCOUNTER — Encounter (HOSPITAL_COMMUNITY): Payer: Self-pay

## 2023-05-02 ENCOUNTER — Other Ambulatory Visit: Payer: Self-pay

## 2023-05-02 ENCOUNTER — Emergency Department (HOSPITAL_COMMUNITY)
Admission: EM | Admit: 2023-05-02 | Discharge: 2023-05-02 | Disposition: A | Discharge status: Home / Self Care | Payer: Medicaid Other | Attending: Emergency Medicine | Admitting: Emergency Medicine

## 2023-05-02 ENCOUNTER — Other Ambulatory Visit (HOSPITAL_COMMUNITY): Admission: RE | Admit: 2023-05-02 | Payer: Medicaid Other | Source: Ambulatory Visit

## 2023-05-02 DIAGNOSIS — Z3A15 15 weeks gestation of pregnancy: Secondary | ICD-10-CM | POA: Insufficient documentation

## 2023-05-02 DIAGNOSIS — B9689 Other specified bacterial agents as the cause of diseases classified elsewhere: Secondary | ICD-10-CM | POA: Insufficient documentation

## 2023-05-02 DIAGNOSIS — N760 Acute vaginitis: Secondary | ICD-10-CM | POA: Diagnosis not present

## 2023-05-02 DIAGNOSIS — O23592 Infection of other part of genital tract in pregnancy, second trimester: Secondary | ICD-10-CM | POA: Insufficient documentation

## 2023-05-02 DIAGNOSIS — O209 Hemorrhage in early pregnancy, unspecified: Secondary | ICD-10-CM | POA: Insufficient documentation

## 2023-05-02 DIAGNOSIS — O4690 Antepartum hemorrhage, unspecified, unspecified trimester: Secondary | ICD-10-CM

## 2023-05-02 LAB — WET PREP, GENITAL
Sperm: NONE SEEN
Trich, Wet Prep: NONE SEEN
WBC, Wet Prep HPF POC: <10 (ref <10)
Yeast Wet Prep HPF POC: NONE SEEN

## 2023-05-02 LAB — COMPREHENSIVE METABOLIC PANEL
ALT: 12 U/L (ref 0–44)
AST: 11 U/L — ABNORMAL LOW (ref 15–41)
Albumin: 3.5 g/dL (ref 3.5–5.0)
Alkaline Phosphatase: 62 U/L (ref 38–126)
Anion gap: 11 (ref 5–15)
BUN: 5 mg/dL — ABNORMAL LOW (ref 6–20)
CO2: 21 mmol/L — ABNORMAL LOW (ref 22–32)
Calcium: 9.1 mg/dL (ref 8.9–10.3)
Chloride: 102 mmol/L (ref 98–111)
Creatinine, Ser: 0.5 mg/dL (ref 0.44–1.00)
GFR, Estimated: >60 mL/min (ref >60)
Glucose, Bld: 86 mg/dL (ref 70–99)
Potassium: 3.3 mmol/L — ABNORMAL LOW (ref 3.5–5.1)
Sodium: 134 mmol/L — ABNORMAL LOW (ref 135–145)
Total Bilirubin: 0.4 mg/dL (ref 0.3–1.2)
Total Protein: 7 g/dL (ref 6.5–8.1)

## 2023-05-02 LAB — CBC WITH DIFFERENTIAL/PLATELET
Abs Immature Granulocytes: 0.06 10*3/uL (ref 0.00–0.07)
Basophils Absolute: 0 10*3/uL (ref 0.0–0.1)
Basophils Relative: 0 %
Eosinophils Absolute: 0.2 10*3/uL (ref 0.0–0.5)
Eosinophils Relative: 2 %
HCT: 37.3 % (ref 36.0–46.0)
Hemoglobin: 12.7 g/dL (ref 12.0–15.0)
Immature Granulocytes: 1 %
Lymphocytes Relative: 18 %
Lymphs Abs: 1.6 10*3/uL (ref 0.7–4.0)
MCH: 29.3 pg (ref 26.0–34.0)
MCHC: 34 g/dL (ref 30.0–36.0)
MCV: 86.1 fL (ref 80.0–100.0)
Monocytes Absolute: 0.5 10*3/uL (ref 0.1–1.0)
Monocytes Relative: 6 %
Neutro Abs: 6.2 10*3/uL (ref 1.7–7.7)
Neutrophils Relative %: 73 %
Platelets: 192 10*3/uL (ref 150–400)
RBC: 4.33 MIL/uL (ref 3.87–5.11)
RDW: 13.4 % (ref 11.5–15.5)
WBC: 8.6 10*3/uL (ref 4.0–10.5)
nRBC: 0 % (ref 0.0–0.2)

## 2023-05-02 LAB — URINALYSIS, ROUTINE W REFLEX MICROSCOPIC
Bilirubin Urine: NEGATIVE
Glucose, UA: NEGATIVE mg/dL
Ketones, ur: 5 mg/dL — AB
Leukocytes,Ua: NEGATIVE
Nitrite: NEGATIVE
Protein, ur: 30 mg/dL — AB
Specific Gravity, Urine: 1.023 (ref 1.005–1.030)
pH: 5 (ref 5.0–8.0)

## 2023-05-02 MED ORDER — ACETAMINOPHEN 500 MG PO TABS
1000.0000 mg | ORAL_TABLET | Freq: Once | ORAL | Status: AC
  Administered 2023-05-02: 1000 mg via ORAL
  Filled 2023-05-02: qty 2

## 2023-05-02 MED ORDER — METRONIDAZOLE 500 MG PO TABS
500.0000 mg | ORAL_TABLET | Freq: Once | ORAL | Status: AC
  Administered 2023-05-02: 500 mg via ORAL
  Filled 2023-05-02: qty 1

## 2023-05-02 NOTE — ED Provider Notes (Signed)
[place] EMERGENCY DEPARTMENT AT [facility] Provider Note   CSN: [id number] Arrival date & time: 05/02/23  1540     History  Chief Complaint  Patient presents with   Vaginal Bleeding    Julie Case is a 32 y.o. female.  Pt is a 32 yo female with pmhx significant for epilepsy and depression.  She is a G9P5 [redacted] week pregnant patient.  She did have an [place] on 9/20 which did show a small SCH.  She had a little more spotting today with some lower pelvic pain.  No vaginal d/c.  No fevers.       Home Medications Prior to Admission medications   Medication Sig Start Date End Date Taking? Authorizing Provider  Blood Pressure Monitor MISC For regular home bp monitoring during pregnancy 04/17/23   Arabella Merles, CNM  fluticasone [facility]) 50 MCG/ACT nasal spray Place 2 sprays into both nostrils daily. 10/19/22   Gilmore Laroche, FNP  ondansetron (ZOFRAN) 4 MG tablet Take 1 tablet (4 mg total) by mouth every 6 (six) hours. 09/04/22   Burgess Amor, PA-C  Prenatal Vit-Fe Fumarate-FA (PRENATAL VITAMIN PO) Take by mouth.    [provider]  Vitamin D, Ergocalciferol, (DRISDOL) 1.25 MG (50000 UNIT) CAPS capsule Take 1 capsule (50,000 Units total) by mouth every 7 (seven) days. 12/25/22   Gilmore Laroche, FNP      Allergies    Penicillins and Naproxen    Review of Systems   Review of Systems  Genitourinary:  Positive for vaginal bleeding.  All other systems reviewed and are negative.   Physical Exam Updated Vital Signs BP 124/74   Pulse 83   Temp 98.2 F (36.8 C) (Oral)   Resp 16   Wt 99.8 kg   LMP 01/15/2023 (Exact Date)   SpO2 99%   BMI 36.61 kg/m  Physical Exam Vitals and nursing note reviewed. Exam conducted with a chaperone present.  Constitutional:      Appearance: Normal appearance.  HENT:     Head: Normocephalic and atraumatic.     Right Ear: External ear normal.     Left Ear: External ear normal.     Nose: Nose normal.     Mouth/Throat:     Mouth:  Mucous membranes are moist.     Pharynx: Oropharynx is clear.  Eyes:     Extraocular Movements: Extraocular movements intact.     Conjunctiva/sclera: Conjunctivae normal.     Pupils: Pupils are equal, round, and reactive to light.  Cardiovascular:     Rate and Rhythm: Normal rate and regular rhythm.     Pulses: Normal pulses.     Heart sounds: Normal heart sounds.  Pulmonary:     Effort: Pulmonary effort is normal.     Breath sounds: Normal breath sounds.  Abdominal:     General: Abdomen is flat. Bowel sounds are normal.     Palpations: Abdomen is soft.  Genitourinary:    Exam position: Lithotomy position.     Comments: Pt has a small amt of brown blood in vault.  No new, bright red blood. Musculoskeletal:        General: Normal range of motion.     Cervical back: Normal range of motion and neck supple.  Skin:    General: Skin is warm.     Capillary Refill: Capillary refill takes less than 2 seconds.  Neurological:     General: No focal deficit present.     Mental Status: She is  alert and oriented to person, place, and time.  Psychiatric:        Mood and Affect: Mood normal.        Behavior: Behavior normal.        Thought Content: Thought content normal.        Judgment: Judgment normal.     ED Results / Procedures / Treatments   Labs (all labs ordered are listed, but only abnormal results are displayed) Labs Reviewed  WET PREP, GENITAL - Abnormal; Notable for the following components:      Result Value   Clue Cells Wet Prep HPF POC PRESENT (*)    All other components within normal limits  COMPREHENSIVE METABOLIC PANEL - Abnormal; Notable for the following components:   Sodium 134 (*)    Potassium 3.3 (*)    CO2 21 (*)    BUN 5 (*)    AST 11 (*)    All other components within normal limits  URINALYSIS, ROUTINE W REFLEX MICROSCOPIC - Abnormal; Notable for the following components:   Color, Urine AMBER (*)    APPearance CLOUDY (*)    Hgb urine dipstick LARGE (*)     Ketones, ur 5 (*)    Protein, ur 30 (*)    Bacteria, UA RARE (*)    All other components within normal limits  CBC WITH DIFFERENTIAL/PLATELET  GC/CHLAMYDIA PROBE AMP ([place]) NOT AT [facility]    EKG None  Radiology No results found.  Procedures Procedures    Medications Ordered in ED Medications  metroNIDAZOLE (FLAGYL) tablet 500 mg (has no administration in time range)  acetaminophen (TYLENOL) tablet 1,000 mg (1,000 mg Oral Given 05/02/23 2052)    ED Course/ Medical Decision Making/ A&P                                 Medical Decision Making Amount and/or Complexity of Data Reviewed Labs: ordered.  Risk OTC drugs. Prescription drug management.   This patient presents to the ED for concern of vaginal bleeding, this involves an extensive number of treatment options, and is a complaint that carries with it a high risk of complications and morbidity.  The differential diagnosis includes miscarriage, sch   Co morbidities that complicate the patient evaluation  Epilepsy, depression   Additional history obtained:  Additional history obtained from epic chart review External records from outside source obtained and reviewed including husband   Lab Tests:  I Ordered, and personally interpreted labs.  The pertinent results include:  cbc nl, cmp nl, ua nl other than some blood; blood type O+; vaginal swab + BV   Medicines ordered and prescription drug management:  I ordered medication including tylenol  for pain  Reevaluation of the patient after these medicines showed that the patient improved I have reviewed the patients home medicines and have made adjustments as needed   Test Considered:  [place]:  bedside US done which showed good fetal mvmt and hr   Critical Interventions:  [place]   Problem List / ED Course:  2nd trimester vaginal bleeding:  likely due to passing [facility] blood.  Baby looks good on [place].  Pt is to f/u with obgyn. BV:  pt tx'd with  flagyl   Reevaluation:  After the interventions noted above, I reevaluated the patient and found that they have :improved   Social Determinants of Health:  Lives at home   Dispostion:  After consideration of  the diagnostic results and the patients response to treatment, I feel that the patent would benefit from discharge with outpatient f/u.          Final Clinical Impression(s) / ED Diagnoses Final diagnoses:  Vaginal bleeding in pregnancy  Bacterial vaginosis    Rx / DC Orders ED Discharge Orders     None         Jacalyn Lefevre, MD 05/02/23 2217

## 2023-05-02 NOTE — ED Notes (Signed)
Pt tolerating fluids without difficulty.

## 2023-05-02 NOTE — ED Triage Notes (Signed)
Left lower quadrant abd pain with vaginal bleeding described as spotting and brown that started 9/30.  Pt reports subchorionic hemorrhage on [place] on 04/17/23.  [redacted] weeks pregnant G9P5

## 2023-05-02 NOTE — ED Notes (Signed)
Discharge instructions reviewed with medications discussed.   Opportunity for questions and concerns provided.   Ambulatory. No signs of distress noted prior to departure.   Declined discharge vital sign recheck.

## 2023-05-02 NOTE — ED Notes (Signed)
ED Provider at bedside. 

## 2023-05-03 ENCOUNTER — Other Ambulatory Visit: Payer: Medicaid Other

## 2023-05-03 ENCOUNTER — Other Ambulatory Visit: Payer: Self-pay | Admitting: Obstetrics & Gynecology

## 2023-05-03 DIAGNOSIS — B9689 Other specified bacterial agents as the cause of diseases classified elsewhere: Secondary | ICD-10-CM

## 2023-05-03 MED ORDER — METRONIDAZOLE 500 MG PO TABS
500.0000 mg | ORAL_TABLET | Freq: Two times a day (BID) | ORAL | 0 refills | Status: AC
Start: 2023-05-03 — End: 2023-05-10

## 2023-05-03 NOTE — Progress Notes (Signed)
Treatment for BV sent in  Myna Hidalgo, DO Attending Obstetrician & Gynecologist, [facility] for [facility], [facility]

## 2023-05-06 LAB — GC/CHLAMYDIA PROBE AMP (~~LOC~~) NOT AT ARMC
Chlamydia: NEGATIVE
Comment: NEGATIVE
Comment: NORMAL
Neisseria Gonorrhea: NEGATIVE

## 2023-05-15 ENCOUNTER — Encounter: Payer: Self-pay | Admitting: Women's Health

## 2023-05-15 ENCOUNTER — Ambulatory Visit (INDEPENDENT_AMBULATORY_CARE_PROVIDER_SITE_OTHER): Payer: Medicaid Other | Admitting: Women's Health

## 2023-05-15 ENCOUNTER — Other Ambulatory Visit (HOSPITAL_COMMUNITY)
Admission: RE | Admit: 2023-05-15 | Discharge: 2023-05-15 | Disposition: A | Discharge status: Home / Self Care | Payer: Medicaid Other | Source: Ambulatory Visit | Attending: Women's Health | Admitting: Women's Health

## 2023-05-15 VITALS — BP 114/81 | HR 94 | Wt 221.0 lb

## 2023-05-15 DIAGNOSIS — Z1379 Encounter for other screening for genetic and chromosomal anomalies: Secondary | ICD-10-CM | POA: Diagnosis not present

## 2023-05-15 DIAGNOSIS — Z3480 Encounter for supervision of other normal pregnancy, unspecified trimester: Secondary | ICD-10-CM

## 2023-05-15 DIAGNOSIS — Z3482 Encounter for supervision of other normal pregnancy, second trimester: Secondary | ICD-10-CM | POA: Diagnosis present

## 2023-05-15 DIAGNOSIS — L292 Pruritus vulvae: Secondary | ICD-10-CM | POA: Diagnosis present

## 2023-05-15 DIAGNOSIS — Z363 Encounter for antenatal screening for malformations: Secondary | ICD-10-CM

## 2023-05-15 NOTE — Progress Notes (Signed)
LOW-RISK PREGNANCY VISIT Patient name: Julie Case MRN [id number]  Date of birth: 1991/07/26 Chief Complaint:   Routine Prenatal Visit (2nd IT)  History of Present Illness:   Julie Case is a 32 y.o. [id number] female at [redacted]w[redacted]d with an Estimated Date of Delivery: 10/22/23 being seen today for ongoing management of a low-risk pregnancy.   Today she reports  vulvovaginal itching, would like to be rechecked for BV . Contractions: Not present.  .  Movement: Absent. denies leaking of fluid.     04/17/2023    2:01 PM 03/05/2023    2:16 PM 01/28/2023    2:40 PM 01/28/2023    2:24 PM 12/27/2022    2:25 PM  Depression screen PHQ 2/9  Decreased Interest 0 0 0 0 0  Down, Depressed, Hopeless 1 0 0 0 0  PHQ - 2 Score 1 0 0 0 0  Altered sleeping 1 0 0 0   Tired, decreased energy 1 0 2 0   Change in appetite 1 0 1 0   Feeling bad or failure about yourself  0 0 0 0   Trouble concentrating 0 0 0 0   Moving slowly or fidgety/restless 0 0 0 0   Suicidal thoughts 0 0 0 0   PHQ-9 Score 4 0 3 0   Difficult doing work/chores  Not difficult at all Not difficult at all Not difficult at all         04/17/2023    2:01 PM 03/05/2023    2:16 PM 01/28/2023    2:40 PM 01/28/2023    2:24 PM  GAD 7 : Generalized Anxiety Score  Nervous, Anxious, on Edge 0 0 0 0  Control/stop worrying 0 0 1 0  Worry too much - different things 0 0 0 0  Trouble relaxing 0 0 1 0  Restless 0 0 0 0  Easily annoyed or irritable 0 0 0 0  Afraid - awful might happen 0 0 0 0  Total GAD 7 Score 0 0 2 0  Anxiety Difficulty  Not difficult at all Somewhat difficult Not difficult at all      Review of Systems:   Pertinent items are noted in HPI Denies abnormal vaginal discharge w/ itching/odor/irritation, headaches, visual changes, shortness of breath, chest pain, abdominal pain, severe nausea/vomiting, or problems with urination or bowel movements unless otherwise stated above. Pertinent History Reviewed:  Reviewed past medical,surgical,  social, obstetrical and family history.  Reviewed problem list, medications and allergies. Physical Assessment:   Vitals:   05/15/23 1325  BP: 114/81  Pulse: 94  Weight: 221 lb (100.2 kg)  Body mass index is 36.78 kg/m.        Physical Examination:   General appearance: Well appearing, and in no distress  Mental status: Alert, oriented to person, place, and time  Skin: Warm & dry  Cardiovascular: Normal heart rate noted  Respiratory: Normal respiratory effort, no distress  Abdomen: Soft, gravid, nontender  Pelvic: CV swab collected by Everlena Cooper, SNM        Extremities: Edema: None  Fetal Status: Fetal Heart Rate (bpm): 158   Movement: Absent    Chaperone: Peggy Dones   No results found for this or any previous visit (from the past 24 hour(s)).  Assessment & Plan:  1) Low-risk pregnancy [id number] at [redacted]w[redacted]d with an Estimated Date of Delivery: 10/22/23   2) Vulvovaginal itching, CV swab   Meds: No orders of the defined types were placed in this  encounter.  Labs/procedures today: CV swab and 2nd IT  Plan:  Continue routine obstetrical care  Next visit: prefers will be in person for u/s     Reviewed: Preterm labor symptoms and general obstetric precautions including but not limited to vaginal bleeding, contractions, leaking of fluid and fetal movement were reviewed in detail with the patient.  All questions were answered. Does have home bp cuff. Office bp cuff given: not applicable. Check bp weekly, let us know if consistently >140 and/or >90.  Follow-up: Return for As scheduled.  Future Appointments  Date Time Provider Department Center  06/05/2023  1:30 PM [facility] [place] [facility] None  06/05/2023  2:30 PM Arabella Merles, CNM [facility]  08/05/2023 10:40 AM Gilmore Laroche, FNP [facility]    Orders Placed This Encounter  Procedures   INTEGRATED 2   Cheral Marker CNM, [facility] 05/15/2023 2:15 PM

## 2023-05-15 NOTE — Patient Instructions (Signed)
Julie Case, thank you for choosing our office today! We appreciate the opportunity to meet your healthcare needs. You may receive a short survey by mail, e-mail, or through [employer]. If you are happy with your care we would appreciate if you could take just a few minutes to complete the survey questions. We read all of your comments and take your feedback very seriously. Thank you again for choosing our office.  Center for [employer] Team at [facility] at [facility] ([address] [place], [place] [address]) Entrance C, located off of [address] Free 24/7 valet parking  Go to [email or web address] to register for FREE online childbirth classes  Call the office [phone]) or go to [facility] if: You begin to severe cramping Your water breaks.  Sometimes it is a big gush of fluid, sometimes it is just a trickle that keeps getting your panties wet or running down your legs You have vaginal bleeding.  It is normal to have a small amount of spotting if your cervix was checked.   [facility] Pediatricians/Family Doctors [facility]): [address] Colette Ribas, [phone]           [facility]: [address], [phone]                [facility]): [address], [phone] (call to ask if accepting patients) [facility] Department: [address], [place], [phone]    [facility] Pediatricians/Family Doctors [facility]): [phone] [address], [phone] [facility]: [address] [place], [phone] [facility]: [address], [phone]  [facility] Doctors  [facility]): [phone] [facility]: [address] [phone]   [facility] Doctors [facility]: [address] [phone]  [facility] Doctors  [facility]: [phone], [phone]  Home Blood Pressure Monitoring for Patients   Your provider has recommended that you check your blood pressure (BP) at least once a week at home. If you do not have a blood pressure cuff at home, one will be provided for you. Contact your provider if you have not received your monitor within 1 week.   Helpful Tips for Accurate Home Blood Pressure Checks  Don't smoke, exercise, or drink caffeine 30 minutes before checking your BP Use the restroom before checking your BP (a full bladder can raise your pressure) Relax in a comfortable upright chair Feet on the ground Left arm resting comfortably on a flat surface at the level of your heart Legs uncrossed Back supported Sit quietly and don't talk Place the cuff on your bare arm Adjust snuggly, so that only two fingertips can fit between your skin and the top of the cuff Check 2 readings separated by at least one minute Keep a log of your BP readings For a visual, please reference this diagram: http://ccnc.care/bpdiagram  Provider Name: [facility]     Phone: [phone]  Zone 1: ALL CLEAR  Continue to monitor your symptoms:  BP reading is less than 140 (top number) or less than 90 (bottom number)  No right upper stomach pain No headaches or seeing spots No feeling nauseated or throwing up No swelling in face and hands  Zone 2: CAUTION Call your doctor's office for any of the following:  BP reading is greater than 140 (top number) or greater than  90 (bottom number)  Stomach pain under your ribs in the middle or right side Headaches or seeing spots Feeling nauseated or throwing up Swelling in face and hands  Zone 3: EMERGENCY  Seek immediate medical care if you have any of the following:  BP reading is greater than160 (top number) or greater than 110 (bottom number) Severe headaches not improving with Tylenol Serious difficulty catching your breath Any worsening symptoms from  Zone 2     Second Trimester of Pregnancy The second trimester is from week 14 through week 27 (months 4 through 6). The second trimester is often a time when you feel your best. Your body has adjusted to being pregnant, and you begin to feel better physically. Usually, morning sickness has lessened or quit completely, you may have more energy, and you may have an increase in appetite. The second trimester is also a time when the fetus is growing rapidly. At the end of the sixth month, the fetus is about 9 inches long and weighs about 1 pounds. You will likely begin to feel the baby move (quickening) between 16 and 20 weeks of pregnancy. Body changes during your second trimester Your body continues to go through many changes during your second trimester. The changes vary from woman to woman. Your weight will continue to increase. You will notice your lower abdomen bulging out. You may begin to get stretch marks on your hips, abdomen, and breasts. You may develop headaches that can be relieved by medicines. The medicines should be approved by your health care provider. You may urinate more often because the fetus is pressing on your bladder. You may develop or continue to have heartburn as a result of your pregnancy. You may develop constipation because certain hormones are causing the muscles that push waste through your intestines to slow down. You may develop hemorrhoids or swollen, bulging veins (varicose veins). You may have back pain. This is caused by: Weight gain. Pregnancy hormones that are relaxing the joints in your pelvis. A shift in weight and the muscles that support your balance. Your breasts will continue to grow and they will continue to become tender. Your gums may bleed and may be sensitive to brushing and flossing. Dark spots or blotches (chloasma, mask of pregnancy) may develop on your face. This will likely fade after the baby is born. A dark line from your belly button to  the pubic area (linea nigra) may appear. This will likely fade after the baby is born. You may have changes in your hair. These can include thickening of your hair, rapid growth, and changes in texture. Some women also have hair loss during or after pregnancy, or hair that feels dry or thin. Your hair will most likely return to normal after your baby is born.  What to expect at prenatal visits During a routine prenatal visit: You will be weighed to make sure you and the fetus are growing normally. Your blood pressure will be taken. Your abdomen will be measured to track your baby's growth. The fetal heartbeat will be listened to. Any test results from the previous visit will be discussed.  Your health care provider may ask you: How you are feeling. If you are feeling the baby move. If you have had any abnormal symptoms, such as leaking fluid, bleeding, severe headaches, or abdominal cramping. If you are using any tobacco products, including cigarettes, chewing tobacco, and electronic cigarettes. If you have any questions.  Other tests that may be performed during  your second trimester include: Blood tests that check for: Low iron levels (anemia). High blood sugar that affects pregnant women (gestational diabetes) between 84 and 28 weeks. Rh antibodies. This is to check for a protein on red blood cells (Rh factor). Urine tests to check for infections, diabetes, or protein in the urine. An ultrasound to confirm the proper growth and development of the baby. An amniocentesis to check for possible genetic problems. Fetal screens for spina bifida and Down syndrome. HIV (human immunodeficiency virus) testing. Routine prenatal testing includes screening for HIV, unless you choose not to have this test.  Follow these instructions at home: Medicines Follow your health care provider's instructions regarding medicine use. Specific medicines may be either safe or unsafe to take during  pregnancy. Take a prenatal vitamin that contains at least 600 micrograms (mcg) of folic acid. If you develop constipation, try taking a stool softener if your health care provider approves. Eating and drinking Eat a balanced diet that includes fresh fruits and vegetables, whole grains, good sources of protein such as meat, eggs, or tofu, and low-fat dairy. Your health care provider will help you determine the amount of weight gain that is right for you. Avoid raw meat and uncooked cheese. These carry germs that can cause birth defects in the baby. If you have low calcium intake from food, talk to your health care provider about whether you should take a daily calcium supplement. Limit foods that are high in fat and processed sugars, such as fried and sweet foods. To prevent constipation: Drink enough fluid to keep your urine clear or pale yellow. Eat foods that are high in fiber, such as fresh fruits and vegetables, whole grains, and beans. Activity Exercise only as directed by your health care provider. Most women can continue their usual exercise routine during pregnancy. Try to exercise for 30 minutes at least 5 days a week. Stop exercising if you experience uterine contractions. Avoid heavy lifting, wear low heel shoes, and practice good posture. A sexual relationship may be continued unless your health care provider directs you otherwise. Relieving pain and discomfort Wear a good support bra to prevent discomfort from breast tenderness. Take warm sitz baths to soothe any pain or discomfort caused by hemorrhoids. Use hemorrhoid cream if your health care provider approves. Rest with your legs elevated if you have leg cramps or low back pain. If you develop varicose veins, wear support hose. Elevate your feet for 15 minutes, 3-4 times a day. Limit salt in your diet. Prenatal Care Write down your questions. Take them to your prenatal visits. Keep all your prenatal visits as told by your health  care provider. This is important. Safety Wear your seat belt at all times when driving. Make a list of emergency phone numbers, including numbers for family, friends, the hospital, and police and fire departments. General instructions Ask your health care provider for a referral to a local prenatal education class. Begin classes no later than the beginning of month 6 of your pregnancy. Ask for help if you have counseling or nutritional needs during pregnancy. Your health care provider can offer advice or refer you to specialists for help with various needs. Do not use hot tubs, steam rooms, or saunas. Do not douche or use tampons or scented sanitary pads. Do not cross your legs for long periods of time. Avoid cat litter boxes and soil used by cats. These carry germs that can cause birth defects in the baby and possibly loss of the  fetus by miscarriage or stillbirth. Avoid all smoking, herbs, alcohol, and unprescribed drugs. Chemicals in these products can affect the formation and growth of the baby. Do not use any products that contain nicotine or tobacco, such as cigarettes and e-cigarettes. If you need help quitting, ask your health care provider. Visit your dentist if you have not gone yet during your pregnancy. Use a soft toothbrush to brush your teeth and be gentle when you floss. Contact a health care provider if: You have dizziness. You have mild pelvic cramps, pelvic pressure, or nagging pain in the abdominal area. You have persistent nausea, vomiting, or diarrhea. You have a bad smelling vaginal discharge. You have pain when you urinate. Get help right away if: You have a fever. You are leaking fluid from your vagina. You have spotting or bleeding from your vagina. You have severe abdominal cramping or pain. You have rapid weight gain or weight loss. You have shortness of breath with chest pain. You notice sudden or extreme swelling of your face, hands, ankles, feet, or legs. You  have not felt your baby move in over an hour. You have severe headaches that do not go away when you take medicine. You have vision changes. Summary The second trimester is from week 14 through week 27 (months 4 through 6). It is also a time when the fetus is growing rapidly. Your body goes through many changes during pregnancy. The changes vary from woman to woman. Avoid all smoking, herbs, alcohol, and unprescribed drugs. These chemicals affect the formation and growth your baby. Do not use any tobacco products, such as cigarettes, chewing tobacco, and e-cigarettes. If you need help quitting, ask your health care provider. Contact your health care provider if you have any questions. Keep all prenatal visits as told by your health care provider. This is important. This information is not intended to replace advice given to you by your health care provider. Make sure you discuss any questions you have with your health care provider. Document Released: 07/10/2001 Document Revised: 12/22/2015 Document Reviewed: 09/16/2012 Elsevier Interactive Patient Education  2017 [employer].

## 2023-05-17 LAB — INTEGRATED 2
AFP MoM: 1.24
Alpha-Fetoprotein: 33.2 ng/mL
Crown Rump Length: 74.7 mm
DIA MoM: 2.56
DIA Value: 326.2 pg/mL
Estriol, Unconjugated: 0.87 ng/mL
Gest. Age on Collection Date: 13.3 wk
Gestational Age: 17.3 wk
Maternal Age at EDD: 34.7 a
Nuchal Translucency (NT): 1.9 mm
Nuchal Translucency MoM: 1.23
Number of Fetuses: 1
PAPP-A MoM: 0.52
PAPP-A Value: 408.9 ng/mL
Sonographer ID#: 309760
Test Results:: NEGATIVE
Weight: 225 [lb_av]
Weight: 225 [lb_av]
hCG MoM: 1.77
hCG Value: 37.2 [IU]/mL
uE3 MoM: 0.88

## 2023-05-17 LAB — CERVICOVAGINAL ANCILLARY ONLY
Bacterial Vaginitis (gardnerella): NEGATIVE
Candida Glabrata: NEGATIVE
Candida Vaginitis: NEGATIVE
Chlamydia: NEGATIVE
Comment: NEGATIVE
Comment: NEGATIVE
Comment: NEGATIVE
Comment: NEGATIVE
Comment: NEGATIVE
Comment: NORMAL
Neisseria Gonorrhea: NEGATIVE
Trichomonas: NEGATIVE

## 2023-06-05 ENCOUNTER — Encounter: Payer: Self-pay | Admitting: Advanced Practice Midwife

## 2023-06-05 ENCOUNTER — Ambulatory Visit: Payer: Medicaid Other

## 2023-06-05 ENCOUNTER — Ambulatory Visit (INDEPENDENT_AMBULATORY_CARE_PROVIDER_SITE_OTHER): Payer: Medicaid Other | Admitting: Advanced Practice Midwife

## 2023-06-05 VITALS — BP 118/68 | HR 92 | Wt 222.0 lb

## 2023-06-05 DIAGNOSIS — Z363 Encounter for antenatal screening for malformations: Secondary | ICD-10-CM

## 2023-06-05 DIAGNOSIS — Z3A20 20 weeks gestation of pregnancy: Secondary | ICD-10-CM

## 2023-06-05 DIAGNOSIS — Z3480 Encounter for supervision of other normal pregnancy, unspecified trimester: Secondary | ICD-10-CM

## 2023-06-05 DIAGNOSIS — G2581 Restless legs syndrome: Secondary | ICD-10-CM | POA: Insufficient documentation

## 2023-06-05 MED ORDER — MAGNESIUM GLYCINATE 120 MG PO CAPS
3.0000 | ORAL_CAPSULE | Freq: Every day | ORAL | 5 refills | Status: DC
Start: 2023-06-05 — End: 2023-11-27

## 2023-06-05 MED ORDER — MAGNESIUM 200 MG PO TABS: 400.0000 mg | ORAL_TABLET | Freq: Every day | ORAL | 6 refills | Status: DC

## 2023-06-05 NOTE — Progress Notes (Signed)
   LOW-RISK PREGNANCY VISIT Patient name: Julie Case MRN [id number]  Date of birth: 31-Jul-1990 Chief Complaint:   Routine Prenatal Visit (Anatomy scan)  History of Present Illness:   Julie Case is a 32 y.o. [id number] female at [redacted]w[redacted]d with an Estimated Date of Delivery: 10/22/23 being seen today for ongoing management of a low-risk pregnancy.  Today she reports  RLS making sleep difficult (had w prev preg and took magnesium which helped) . Contractions: Not present. Vag. Bleeding: None.  Movement: Present. denies leaking of fluid. Review of Systems:   Pertinent items are noted in HPI Denies abnormal vaginal discharge w/ itching/odor/irritation, headaches, visual changes, shortness of breath, chest pain, abdominal pain, severe nausea/vomiting, or problems with urination or bowel movements unless otherwise stated above. Pertinent History Reviewed:  Reviewed past medical,surgical, social, obstetrical and family history.  Reviewed problem list, medications and allergies. Physical Assessment:   Vitals:   06/05/23 1417  BP: 118/68  Pulse: 92  Weight: 222 lb (100.7 kg)  Body mass index is 36.94 kg/m.        Physical Examination:   General appearance: Well appearing, and in no distress  Mental status: Alert, oriented to person, place, and time  Skin: Warm & dry  Cardiovascular: Normal heart rate noted  Respiratory: Normal respiratory effort, no distress  Abdomen: Soft, gravid, nontender  Pelvic: Cervical exam deferred         Extremities: Edema: None  Fetal Status: Fetal Heart Rate (bpm): 164 u/s   Movement: Present    Anatomy u/s: [place] 20+1 wks,cephalic,cx 3.8 cm,posterior placenta gr 0,normal right ovary,simple left corpus luteal cyst 2.8 x 2.6 x 2.7 cm,SVP of fluid 4.4 cm,FHR 164 bpm,left renal pelvic dilatation 4.7 mm,RK 3.5 mm (wnl),EFW 372 g 76%,anatomy complete   No results found for this or any previous visit (from the past 24 hour(s)).  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    Dispense:  30 tablet    Refill:  6    Order Specific Question:   Supervising Provider    Answer:   Myna Hidalgo [[id number]]   Magnesium Glycinate 120 MG CAPS    Sig: Take 3 tablets by mouth daily.    Dispense:  90 capsule    Refill:  5    Order Specific Question:   Supervising Provider    Answer:   Myna Hidalgo [[id number]]   Labs/procedures today: anatomy u/s  Plan:  Continue routine obstetrical care   Reviewed: Preterm labor symptoms and general obstetric precautions including but not limited to vaginal bleeding, contractions, leaking of fluid and fetal movement were reviewed in detail with the patient.  All questions were answered. Has home bp cuff.  Check bp weekly, let us know if >140/90.   Follow-up: Return in about 4 weeks (around 07/03/2023) for LROB.  No orders of the defined types were placed in this encounter.  Arabella Merles CNM 06/05/2023 2:42 PM

## 2023-06-05 NOTE — Progress Notes (Signed)
[place] 20+1 wks,cephalic,cx 3.8 cm,posterior placenta gr 0,normal right ovary,simple left corpus luteal cyst 2.8 x 2.6 x 2.7 cm,SVP of fluid 4.4 cm,FHR 164 bpm,left renal pelvic dilatation 4.7 mm,RK 3.5 mm (wnl),EFW 372 g 76%,anatomy complete

## 2023-06-05 NOTE — Patient Instructions (Signed)
Quinn, thank you for choosing our office today! We appreciate the opportunity to meet your healthcare needs. You may receive a short survey by mail, e-mail, or through MyChart. If you are happy with your care we would appreciate if you could take just a few minutes to complete the survey questions. We read all of your comments and take your feedback very seriously. Thank you again for choosing our office.  [facility] Team at [facility] ([address] [place], [place] [address]) Entrance C, located off of [address] Free 24/7 valet parking  Go to [email or web address] to register for FREE online childbirth classes  Call the office ([phone]) or go to [facility] if: You begin to severe cramping Your water breaks.  Sometimes it is a big gush of fluid, sometimes it is just a trickle that keeps getting your panties wet or running down your legs You have vaginal bleeding.  It is normal to have a small amount of spotting if your cervix was checked.   [place] Pediatric[facility] Doctors [facility] ([facility]): [address], [phone]           [facility]: [address], [phone]                [facility] ([facility]): [address], [phone] (call to ask if accepting patients) [facility]: [address], [place], [phone]    [place] [facility]/Family Doctors [facility] ([facility]): [address], [phone] [facility]: [address], [phone] [facility]: [address], [phone]  [place] [facility]  [facility] ([facility]): [phone] [facility]: [address], [phone]   [place] [facility]: [address], [phone]  [place] [facility]  [facility]: [address], [phone]  Home Blood Pressure Monitoring for Patients   Your provider has recommended that you check your blood pressure (BP) at least once a week at home. If you do not have a blood pressure cuff at home, one will be provided for you. Contact your provider if you have not received your monitor within 1 week.   Helpful Tips for Accurate Home Blood Pressure Checks  Don't smoke, exercise, or drink caffeine 30 minutes before checking your BP Use the restroom before checking your BP (a full bladder can raise your pressure) Relax in a comfortable upright chair Feet on the ground Left arm resting comfortably on a flat surface at the level of your heart Legs uncrossed Back supported Sit quietly and don't talk Place the cuff on your bare arm Adjust snuggly, so that only two fingertips can fit between your skin and the top of the cuff Check 2 readings separated by at least one minute Keep a log of your BP readings For a visual, please reference this diagram: http://[facility].care/bpdiagram  Provider Name: [facility]     Phone: [phone]  Zone 1: ALL CLEAR  Continue to monitor your symptoms:  BP reading is less than 140 (top number) or less than 90 (bottom number)  No right upper stomach pain No headaches or seeing spots No feeling nauseated or throwing up No swelling in face and hands  Zone 2: CAUTION Call your doctor's office for any of the following:  BP reading is greater than 140 (top number) or greater than   90 (bottom number)  Stomach pain under your ribs in the middle or right side Headaches or seeing spots Feeling nauseated or throwing up Swelling in face and hands  Zone 3: EMERGENCY  Seek immediate medical care if you have any of the following:  BP reading is greater than160 (top number) or greater than 110 (bottom number) Severe headaches not improving with Tylenol Serious difficulty catching your breath Any worsening symptoms from  Zone 2     Second Trimester of Pregnancy The second trimester is from week 14 through week 27 (months 4 through 6). The second trimester is often a time when you feel your best. Your body has adjusted to being pregnant, and you begin to feel better physically. Usually, morning sickness has lessened or quit completely, you may have more energy, and you may have an increase in appetite. The second trimester is also a time when the fetus is growing rapidly. At the end of the sixth month, the fetus is about 9 inches long and weighs about 1 pounds. You will likely begin to feel the baby move (quickening) between 16 and 20 weeks of pregnancy. Body changes during your second trimester Your body continues to go through many changes during your second trimester. The changes vary from woman to woman. Your weight will continue to increase. You will notice your lower abdomen bulging out. You may begin to get stretch marks on your hips, abdomen, and breasts. You may develop headaches that can be relieved by medicines. The medicines should be approved by your health care provider. You may urinate more often because the fetus is pressing on your bladder. You may develop or continue to have heartburn as a result of your pregnancy. You may develop constipation because certain hormones are causing the muscles that push waste through your intestines to slow down. You may develop hemorrhoids or swollen, bulging veins (varicose veins). You may have back pain. This is caused by: Weight gain. Pregnancy hormones that are relaxing the joints in your pelvis. A shift in weight and the muscles that support your balance. Your breasts will continue to grow and they will continue to become tender. Your gums may bleed and may be sensitive to brushing and flossing. Dark spots or blotches (chloasma, mask of pregnancy) may develop on your face. This will likely fade after the baby is born. A dark line from your belly button to  the pubic area (linea nigra) may appear. This will likely fade after the baby is born. You may have changes in your hair. These can include thickening of your hair, rapid growth, and changes in texture. Some women also have hair loss during or after pregnancy, or hair that feels dry or thin. Your hair will most likely return to normal after your baby is born.  What to expect at prenatal visits During a routine prenatal visit: You will be weighed to make sure you and the fetus are growing normally. Your blood pressure will be taken. Your abdomen will be measured to track your baby's growth. The fetal heartbeat will be listened to. Any test results from the previous visit will be discussed.  Your health care provider may ask you: How you are feeling. If you are feeling the baby move. If you have had any abnormal symptoms, such as leaking fluid, bleeding, severe headaches, or abdominal cramping. If you are using any tobacco products, including cigarettes, chewing tobacco, and electronic cigarettes. If you have any questions.  Other tests that may be performed during   your second trimester include: Blood tests that check for: Low iron levels (anemia). High blood sugar that affects pregnant women (gestational diabetes) between 24 and 28 weeks. Rh antibodies. This is to check for a protein on red blood cells (Rh factor). Urine tests to check for infections, diabetes, or protein in the urine. An ultrasound to confirm the proper growth and development of the baby. An amniocentesis to check for possible genetic problems. Fetal screens for spina bifida and Down syndrome. HIV (human immunodeficiency virus) testing. Routine prenatal testing includes screening for HIV, unless you choose not to have this test.  Follow these instructions at home: Medicines Follow your health care provider's instructions regarding medicine use. Specific medicines may be either safe or unsafe to take during  pregnancy. Take a prenatal vitamin that contains at least 600 micrograms (mcg) of folic acid. If you develop constipation, try taking a stool softener if your health care provider approves. Eating and drinking Eat a balanced diet that includes fresh fruits and vegetables, whole grains, good sources of protein such as meat, eggs, or tofu, and low-fat dairy. Your health care provider will help you determine the amount of weight gain that is right for you. Avoid raw meat and uncooked cheese. These carry germs that can cause birth defects in the baby. If you have low calcium intake from food, talk to your health care provider about whether you should take a daily calcium supplement. Limit foods that are high in fat and processed sugars, such as fried and sweet foods. To prevent constipation: Drink enough fluid to keep your urine clear or pale yellow. Eat foods that are high in fiber, such as fresh fruits and vegetables, whole grains, and beans. Activity Exercise only as directed by your health care provider. Most women can continue their usual exercise routine during pregnancy. Try to exercise for 30 minutes at least 5 days a week. Stop exercising if you experience uterine contractions. Avoid heavy lifting, wear low heel shoes, and practice good posture. A sexual relationship may be continued unless your health care provider directs you otherwise. Relieving pain and discomfort Wear a good support bra to prevent discomfort from breast tenderness. Take warm sitz baths to soothe any pain or discomfort caused by hemorrhoids. Use hemorrhoid cream if your health care provider approves. Rest with your legs elevated if you have leg cramps or low back pain. If you develop varicose veins, wear support hose. Elevate your feet for 15 minutes, 3-4 times a day. Limit salt in your diet. Prenatal Care Write down your questions. Take them to your prenatal visits. Keep all your prenatal visits as told by your health  care provider. This is important. Safety Wear your seat belt at all times when driving. Make a list of emergency phone numbers, including numbers for family, friends, the hospital, and police and fire departments. General instructions Ask your health care provider for a referral to a local prenatal education class. Begin classes no later than the beginning of month 6 of your pregnancy. Ask for help if you have counseling or nutritional needs during pregnancy. Your health care provider can offer advice or refer you to specialists for help with various needs. Do not use hot tubs, steam rooms, or saunas. Do not douche or use tampons or scented sanitary pads. Do not cross your legs for long periods of time. Avoid cat litter boxes and soil used by cats. These carry germs that can cause birth defects in the baby and possibly loss of the   fetus by miscarriage or stillbirth. Avoid all smoking, herbs, alcohol, and unprescribed drugs. Chemicals in these products can affect the formation and growth of the baby. Do not use any products that contain nicotine or tobacco, such as cigarettes and e-cigarettes. If you need help quitting, ask your health care provider. Visit your dentist if you have not gone yet during your pregnancy. Use a soft toothbrush to brush your teeth and be gentle when you floss. Contact a health care provider if: You have dizziness. You have mild pelvic cramps, pelvic pressure, or nagging pain in the abdominal area. You have persistent nausea, vomiting, or diarrhea. You have a bad smelling vaginal discharge. You have pain when you urinate. Get help right away if: You have a fever. You are leaking fluid from your vagina. You have spotting or bleeding from your vagina. You have severe abdominal cramping or pain. You have rapid weight gain or weight loss. You have shortness of breath with chest pain. You notice sudden or extreme swelling of your face, hands, ankles, feet, or legs. You  have not felt your baby move in over an hour. You have severe headaches that do not go away when you take medicine. You have vision changes. Summary The second trimester is from week 14 through week 27 (months 4 through 6). It is also a time when the fetus is growing rapidly. Your body goes through many changes during pregnancy. The changes vary from woman to woman. Avoid all smoking, herbs, alcohol, and unprescribed drugs. These chemicals affect the formation and growth your baby. Do not use any tobacco products, such as cigarettes, chewing tobacco, and e-cigarettes. If you need help quitting, ask your health care provider. Contact your health care provider if you have any questions. Keep all prenatal visits as told by your health care provider. This is important. This information is not intended to replace advice given to you by your health care provider. Make sure you discuss any questions you have with your health care provider. Document Released: 07/10/2001 Document Revised: 12/22/2015 Document Reviewed: 09/16/2012 Elsevier Interactive Patient Education  2017 [employer]  

## 2023-07-03 ENCOUNTER — Encounter: Payer: Self-pay | Admitting: Women's Health

## 2023-07-03 ENCOUNTER — Ambulatory Visit (INDEPENDENT_AMBULATORY_CARE_PROVIDER_SITE_OTHER): Payer: Medicaid Other | Admitting: Women's Health

## 2023-07-03 VITALS — BP 123/77 | HR 100 | Wt 219.8 lb

## 2023-07-03 DIAGNOSIS — Z3482 Encounter for supervision of other normal pregnancy, second trimester: Secondary | ICD-10-CM

## 2023-07-03 DIAGNOSIS — R3911 Hesitancy of micturition: Secondary | ICD-10-CM | POA: Diagnosis not present

## 2023-07-03 DIAGNOSIS — R350 Frequency of micturition: Secondary | ICD-10-CM

## 2023-07-03 DIAGNOSIS — Z3480 Encounter for supervision of other normal pregnancy, unspecified trimester: Secondary | ICD-10-CM

## 2023-07-03 LAB — POCT URINALYSIS DIPSTICK OB
Blood, UA: NEGATIVE
Glucose, UA: NEGATIVE
Ketones, UA: NEGATIVE
Nitrite, UA: NEGATIVE

## 2023-07-03 MED ORDER — NITROFURANTOIN MONOHYD MACRO 100 MG PO CAPS: 100.0000 mg | ORAL_CAPSULE | Freq: Two times a day (BID) | ORAL | 0 refills | Status: DC

## 2023-07-03 NOTE — Progress Notes (Signed)
LOW-RISK PREGNANCY VISIT Patient name: Julie Case MRN [id number]  Date of birth: [birth year] Chief Complaint:   Routine Prenatal Visit  History of Present Illness:   Julie Case is a 32 y.o. [id number] female at [redacted]w[redacted]d with an Estimated Date of Delivery: 10/22/23 being seen today for ongoing management of a low-risk pregnancy.   Today she reports  for past 2 days only dribbles of urine coming out, no burning/discomfort. Some increased frequency. . Contractions: Not present. Vag. Bleeding: None.  Movement: Present. denies leaking of fluid.     04/17/2023    2:01 PM 03/05/2023    2:16 PM 01/28/2023    2:40 PM 01/28/2023    2:24 PM 12/27/2022    2:25 PM  Depression screen PHQ 2/9  Decreased Interest 0 0 0 0 0  Down, Depressed, Hopeless 1 0 0 0 0  PHQ - 2 Score 1 0 0 0 0  Altered sleeping 1 0 0 0   Tired, decreased energy 1 0 2 0   Change in appetite 1 0 1 0   Feeling bad or failure about yourself  0 0 0 0   Trouble concentrating 0 0 0 0   Moving slowly or fidgety/restless 0 0 0 0   Suicidal thoughts 0 0 0 0   PHQ-9 Score 4 0 3 0   Difficult doing work/chores  Not difficult at all Not difficult at all Not difficult at all         04/17/2023    2:01 PM 03/05/2023    2:16 PM 01/28/2023    2:40 PM 01/28/2023    2:24 PM  GAD 7 : Generalized Anxiety Score  Nervous, Anxious, on Edge 0 0 0 0  Control/stop worrying 0 0 1 0  Worry too much - different things 0 0 0 0  Trouble relaxing 0 0 1 0  Restless 0 0 0 0  Easily annoyed or irritable 0 0 0 0  Afraid - awful might happen 0 0 0 0  Total GAD 7 Score 0 0 2 0  Anxiety Difficulty  Not difficult at all Somewhat difficult Not difficult at all      Review of Systems:   Pertinent items are noted in HPI Denies abnormal vaginal discharge w/ itching/odor/irritation, headaches, visual changes, shortness of breath, chest pain, abdominal pain, severe nausea/vomiting, or problems with urination or bowel movements unless otherwise stated above. Pertinent  History Reviewed:  Reviewed past medical,surgical, social, obstetrical and family history.  Reviewed problem list, medications and allergies. Physical Assessment:   Vitals:   07/03/23 1332  BP: 123/77  Pulse: 100  Weight: 219 lb 12.8 oz (99.7 kg)  Body mass index is 36.58 kg/m.        Physical Examination:   General appearance: Well appearing, and in no distress  Mental status: Alert, oriented to person, place, and time  Skin: Warm & dry  Cardiovascular: Normal heart rate noted  Respiratory: Normal respiratory effort, no distress  Abdomen: Soft, gravid, nontender  Pelvic: Cervical exam deferred         Extremities: Edema: None  Fetal Status: Fetal Heart Rate (bpm): 148 Fundal Height: 26 cm Movement: Present    Chaperone: N/A   No results found for this or any previous visit (from the past 24 hour(s)).  Assessment & Plan:  1) Low-risk pregnancy [id number] at [redacted]w[redacted]d with an Estimated Date of Delivery: 10/22/23   2) Urinary frequency and hesitancy, urine tr protein & leuks, rx macrobid, send  urine cx   Meds:  Meds ordered this encounter  Medications   nitrofurantoin, macrocrystal-monohydrate, (MACROBID) 100 MG capsule    Sig: Take 1 capsule (100 mg total) by mouth 2 (two) times daily.    Dispense:  14 capsule    Refill:  0   Labs/procedures today: urine culture and declined flu shot  Plan:  Continue routine obstetrical care  Next visit: prefers will be in person for pn2     Reviewed: Preterm labor symptoms and general obstetric precautions including but not limited to vaginal bleeding, contractions, leaking of fluid and fetal movement were reviewed in detail with the patient.  All questions were answered. Does have home bp cuff. Office bp cuff given: not applicable. Check bp weekly, let us know if consistently >140 and/or >90.  Follow-up: Return in about 4 weeks (around 07/31/2023) for LROB, PN2, CNM, in person.  Future Appointments  Date Time Provider Department Center   08/05/2023 10:40 AM Gilmore Laroche, FNP [facility]    Orders Placed This Encounter  Procedures   Urine Culture   Cheral Marker CNM, [facility] 07/03/2023 1:52 PM

## 2023-07-03 NOTE — Addendum Note (Signed)
Addended by: Moss Mc on: 07/03/2023 01:58 PM   Modules accepted: Orders

## 2023-07-03 NOTE — Patient Instructions (Signed)
Julie Case, thank you for choosing our office today! We appreciate the opportunity to meet your healthcare needs. You may receive a short survey by mail, e-mail, or through [employer]. If you are happy with your care we would appreciate if you could take just a few minutes to complete the survey questions. We read all of your comments and take your feedback very seriously. Thank you again for choosing our office.  Center for [employer] Team at [facility]  [facility] at [facility] ([address] [place], [place] [address]) Entrance C, located off of [address] Free 24/7 valet parking   You will have your sugar test next visit.  Please do not eat or drink anything after midnight the night before you come, not even water.  You will be here for at least two hours.  Please make an appointment online for the bloodwork at [email or web address] for 8:00am (or as close to this as possible). Make sure you select the [facility] service center.   CLASSES: Go to [email or web address] to register for classes (childbirth, breastfeeding, waterbirth, infant CPR, daddy bootcamp, etc.)  Call the office [phone]) or go to [facility] if: You begin to have strong, frequent contractions Your water breaks.  Sometimes it is a big gush of fluid, sometimes it is just a trickle that keeps getting your panties wet or running down your legs You have vaginal bleeding.  It is normal to have a small amount of spotting if your cervix was checked.  You don't feel your baby moving like normal.  If you don't, get you something to eat and drink and lay down and focus on feeling your baby move.   If your baby is still not moving like normal, you should call the office or go to [facility].  Call the office [phone]) or go to [facility] hospital for these signs of pre-eclampsia: Severe headache that does not go away with Tylenol Visual changes- seeing spots, double, blurred vision Pain under your right breast or upper  abdomen that does not go away with Tums or heartburn medicine Nausea and/or vomiting Severe swelling in your hands, feet, and face    [facility]/Family Doctors  [facility]): [address] Colette Ribas, [phone]           [facility]: [address], [phone]                [facility] Family Medicine [facility]): [address], [phone]  [facility] Department: [address], [place], [phone]    [facility] Pediatricians/Family Doctors [facility]): [address], [phone] [facility]: [address] [place], [phone] [facility]: [address], [phone]  [facility] Doctors  [facility]): [phone] [facility]: [address] [phone]   [facility] Doctors [facility]: [address] [phone]  [facility] Doctors  [facility]: [phone], [phone]  Home Blood Pressure Monitoring for Patients   Your provider has recommended that you check your blood pressure (BP) at least once a week at home. If you do not have a blood pressure cuff at home, one will be provided for you. Contact your provider if you have not received your monitor within 1 week.   Helpful Tips for Accurate Home Blood Pressure Checks  Don't smoke, exercise, or drink caffeine 30 minutes before checking  your BP Use the restroom before checking your BP (a full bladder can raise your pressure) Relax in a comfortable upright chair Feet on the ground Left arm resting comfortably on a flat surface at the level of your heart Legs uncrossed Back supported Sit quietly and don't talk Place the cuff on your bare arm Adjust snuggly, so that only two fingertips can fit between your skin and the top of the cuff Check 2 readings separated by at least one  minute Keep a log of your BP readings For a visual, please reference this diagram: http://ccnc.care/bpdiagram  Provider Name: [facility]     Phone: [phone]  Zone 1: ALL CLEAR  Continue to monitor your symptoms:  BP reading is less than 140 (top number) or less than 90 (bottom number)  No right upper stomach pain No headaches or seeing spots No feeling nauseated or throwing up No swelling in face and hands  Zone 2: CAUTION Call your doctor's office for any of the following:  BP reading is greater than 140 (top number) or greater than 90 (bottom number)  Stomach pain under your ribs in the middle or right side Headaches or seeing spots Feeling nauseated or throwing up Swelling in face and hands  Zone 3: EMERGENCY  Seek immediate medical care if you have any of the following:  BP reading is greater than160 (top number) or greater than 110 (bottom number) Severe headaches not improving with Tylenol Serious difficulty catching your breath Any worsening symptoms from Zone 2   Second Trimester of Pregnancy The second trimester is from week 13 through week 28, months 4 through 6. The second trimester is often a time when you feel your best. Your body has also adjusted to being pregnant, and you begin to feel better physically. Usually, morning sickness has lessened or quit completely, you may have more energy, and you may have an increase in appetite. The second trimester is also a time when the fetus is growing rapidly. At the end of the sixth month, the fetus is about 9 inches long and weighs about 1 pounds. You will likely begin to feel the baby move (quickening) between 18 and 20 weeks of the pregnancy. BODY CHANGES Your body goes through many changes during pregnancy. The changes vary from woman to woman.  Your weight will continue to increase. You will notice your lower abdomen bulging out. You may begin to get stretch marks on your hips, abdomen, and breasts. You may  develop headaches that can be relieved by medicines approved by your health care provider. You may urinate more often because the fetus is pressing on your bladder. You may develop or continue to have heartburn as a result of your pregnancy. You may develop constipation because certain hormones are causing the muscles that push waste through your intestines to slow down. You may develop hemorrhoids or swollen, bulging veins (varicose veins). You may have back pain because of the weight gain and pregnancy hormones relaxing your joints between the bones in your pelvis and as a result of a shift in weight and the muscles that support your balance. Your breasts will continue to grow and be tender. Your gums may bleed and may be sensitive to brushing and flossing. Dark spots or blotches (chloasma, mask of pregnancy) may develop on your face. This will likely fade after the baby is born. A dark line from your belly button to the pubic area (linea nigra) may appear. This will likely fade after the  baby is born. You may have changes in your hair. These can include thickening of your hair, rapid growth, and changes in texture. Some women also have hair loss during or after pregnancy, or hair that feels dry or thin. Your hair will most likely return to normal after your baby is born. WHAT TO EXPECT AT YOUR PRENATAL VISITS During a routine prenatal visit: You will be weighed to make sure you and the fetus are growing normally. Your blood pressure will be taken. Your abdomen will be measured to track your baby's growth. The fetal heartbeat will be listened to. Any test results from the previous visit will be discussed. Your health care provider may ask you: How you are feeling. If you are feeling the baby move. If you have had any abnormal symptoms, such as leaking fluid, bleeding, severe headaches, or abdominal cramping. If you have any questions. Other tests that may be performed during your second  trimester include: Blood tests that check for: Low iron levels (anemia). Gestational diabetes (between 24 and 28 weeks). Rh antibodies. Urine tests to check for infections, diabetes, or protein in the urine. An ultrasound to confirm the proper growth and development of the baby. An amniocentesis to check for possible genetic problems. Fetal screens for spina bifida and Down syndrome. HOME CARE INSTRUCTIONS  Avoid all smoking, herbs, alcohol, and unprescribed drugs. These chemicals affect the formation and growth of the baby. Follow your health care provider's instructions regarding medicine use. There are medicines that are either safe or unsafe to take during pregnancy. Exercise only as directed by your health care provider. Experiencing uterine cramps is a good sign to stop exercising. Continue to eat regular, healthy meals. Wear a good support bra for breast tenderness. Do not use hot tubs, steam rooms, or saunas. Wear your seat belt at all times when driving. Avoid raw meat, uncooked cheese, cat litter boxes, and soil used by cats. These carry germs that can cause birth defects in the baby. Take your prenatal vitamins. Try taking a stool softener (if your health care provider approves) if you develop constipation. Eat more high-fiber foods, such as fresh vegetables or fruit and whole grains. Drink plenty of fluids to keep your urine clear or pale yellow. Take warm sitz baths to soothe any pain or discomfort caused by hemorrhoids. Use hemorrhoid cream if your health care provider approves. If you develop varicose veins, wear support hose. Elevate your feet for 15 minutes, 3-4 times a day. Limit salt in your diet. Avoid heavy lifting, wear low heel shoes, and practice good posture. Rest with your legs elevated if you have leg cramps or low back pain. Visit your dentist if you have not gone yet during your pregnancy. Use a soft toothbrush to brush your teeth and be gentle when you floss. A  sexual relationship may be continued unless your health care provider directs you otherwise. Continue to go to all your prenatal visits as directed by your health care provider. SEEK MEDICAL CARE IF:  You have dizziness. You have mild pelvic cramps, pelvic pressure, or nagging pain in the abdominal area. You have persistent nausea, vomiting, or diarrhea. You have a bad smelling vaginal discharge. You have pain with urination. SEEK IMMEDIATE MEDICAL CARE IF:  You have a fever. You are leaking fluid from your vagina. You have spotting or bleeding from your vagina. You have severe abdominal cramping or pain. You have rapid weight gain or loss. You have shortness of breath with chest pain. You  notice sudden or extreme swelling of your face, hands, ankles, feet, or legs. You have not felt your baby move in over an hour. You have severe headaches that do not go away with medicine. You have vision changes. Document Released: 07/10/2001 Document Revised: 07/21/2013 Document Reviewed: 09/16/2012 [facility] Patient Information 2015 [place], [place]. This information is not intended to replace advice given to you by your health care provider. Make sure you discuss any questions you have with your health care provider.

## 2023-07-05 LAB — URINE CULTURE

## 2023-07-13 ENCOUNTER — Encounter: Payer: Self-pay | Admitting: Obstetrics & Gynecology

## 2023-07-13 ENCOUNTER — Inpatient Hospital Stay (HOSPITAL_COMMUNITY)
Admission: AD | Admit: 2023-07-13 | Discharge: 2023-07-13 | Disposition: A | Discharge status: Home / Self Care | Payer: Medicaid Other | Attending: Obstetrics & Gynecology | Admitting: Obstetrics & Gynecology

## 2023-07-13 ENCOUNTER — Encounter (HOSPITAL_COMMUNITY): Payer: Self-pay | Admitting: Obstetrics & Gynecology

## 2023-07-13 DIAGNOSIS — Z3A25 25 weeks gestation of pregnancy: Secondary | ICD-10-CM | POA: Diagnosis not present

## 2023-07-13 DIAGNOSIS — O99332 Smoking (tobacco) complicating pregnancy, second trimester: Secondary | ICD-10-CM | POA: Diagnosis not present

## 2023-07-13 DIAGNOSIS — R519 Headache, unspecified: Secondary | ICD-10-CM

## 2023-07-13 DIAGNOSIS — F17210 Nicotine dependence, cigarettes, uncomplicated: Secondary | ICD-10-CM | POA: Insufficient documentation

## 2023-07-13 DIAGNOSIS — R030 Elevated blood-pressure reading, without diagnosis of hypertension: Secondary | ICD-10-CM | POA: Diagnosis not present

## 2023-07-13 DIAGNOSIS — A09 Infectious gastroenteritis and colitis, unspecified: Secondary | ICD-10-CM | POA: Insufficient documentation

## 2023-07-13 DIAGNOSIS — R197 Diarrhea, unspecified: Secondary | ICD-10-CM | POA: Diagnosis not present

## 2023-07-13 DIAGNOSIS — O99612 Diseases of the digestive system complicating pregnancy, second trimester: Secondary | ICD-10-CM | POA: Insufficient documentation

## 2023-07-13 DIAGNOSIS — O26892 Other specified pregnancy related conditions, second trimester: Secondary | ICD-10-CM

## 2023-07-13 DIAGNOSIS — R0781 Pleurodynia: Secondary | ICD-10-CM | POA: Diagnosis present

## 2023-07-13 LAB — CBC
HCT: 33.8 % — ABNORMAL LOW (ref 36.0–46.0)
Hemoglobin: 11.9 g/dL — ABNORMAL LOW (ref 12.0–15.0)
MCH: 29.6 pg (ref 26.0–34.0)
MCHC: 35.2 g/dL (ref 30.0–36.0)
MCV: 84.1 fL (ref 80.0–100.0)
Platelets: 188 10*3/uL (ref 150–400)
RBC: 4.02 MIL/uL (ref 3.87–5.11)
RDW: 13.9 % (ref 11.5–15.5)
WBC: 10.7 10*3/uL — ABNORMAL HIGH (ref 4.0–10.5)
nRBC: 0 % (ref 0.0–0.2)

## 2023-07-13 LAB — URINALYSIS, ROUTINE W REFLEX MICROSCOPIC
Bilirubin Urine: NEGATIVE
Glucose, UA: NEGATIVE mg/dL
Hgb urine dipstick: NEGATIVE
Ketones, ur: NEGATIVE mg/dL
Nitrite: NEGATIVE
Protein, ur: NEGATIVE mg/dL
Specific Gravity, Urine: 1.003 — ABNORMAL LOW (ref 1.005–1.030)
pH: 7 (ref 5.0–8.0)

## 2023-07-13 LAB — COMPREHENSIVE METABOLIC PANEL
ALT: 7 U/L (ref 0–44)
AST: 12 U/L — ABNORMAL LOW (ref 15–41)
Albumin: 3 g/dL — ABNORMAL LOW (ref 3.5–5.0)
Alkaline Phosphatase: 71 U/L (ref 38–126)
Anion gap: 11 (ref 5–15)
BUN: <5 mg/dL — ABNORMAL LOW (ref 6–20)
CO2: 19 mmol/L — ABNORMAL LOW (ref 22–32)
Calcium: 9 mg/dL (ref 8.9–10.3)
Chloride: 106 mmol/L (ref 98–111)
Creatinine, Ser: 0.36 mg/dL — ABNORMAL LOW (ref 0.44–1.00)
GFR, Estimated: >60 mL/min (ref >60)
Glucose, Bld: 92 mg/dL (ref 70–99)
Potassium: 3.7 mmol/L (ref 3.5–5.1)
Sodium: 136 mmol/L (ref 135–145)
Total Bilirubin: 0.4 mg/dL (ref <1.2)
Total Protein: 6.2 g/dL — ABNORMAL LOW (ref 6.5–8.1)

## 2023-07-13 LAB — PROTEIN / CREATININE RATIO, URINE
Creatinine, Urine: 29 mg/dL
Total Protein, Urine: <6 mg/dL

## 2023-07-13 MED ORDER — ONDANSETRON 4 MG PO TBDP
4.0000 mg | ORAL_TABLET | Freq: Three times a day (TID) | ORAL | 0 refills | Status: DC | PRN
Start: 2023-07-13 — End: 2023-08-14

## 2023-07-13 MED ORDER — ONDANSETRON 4 MG PO TBDP
8.0000 mg | ORAL_TABLET | Freq: Once | ORAL | Status: AC
  Administered 2023-07-13: 8 mg via ORAL
  Filled 2023-07-13: qty 2

## 2023-07-13 MED ORDER — ACETAMINOPHEN 500 MG PO TABS
1000.0000 mg | ORAL_TABLET | Freq: Once | ORAL | Status: AC
  Administered 2023-07-13: 1000 mg via ORAL
  Filled 2023-07-13: qty 2

## 2023-07-13 NOTE — MAU Note (Signed)
Julie Case is a 32 y.o. at [redacted]w[redacted]d here in MAU reporting: around 1300, started feeling nauseated, had a HA and started having pain that seem to come from her ribs (left side. When she got home from the store, she checked her BP, it was high.  Rechecked an hour later and it was even higher. Took Tylenol for HA, helped a little, but not much. Denies bleeding or LOF. Reports +FM Reporting diarrhea, started this morning. Watery x3, no one else at home has it.  Onset of complaint: 1300 Pain score: HA 5, rib 6 Vitals:   07/13/23 1713  BP: 123/76  Pulse: 91  Resp: 18  Temp: 98 F (36.7 C)  SpO2: 99%     FHT:158 Lab orders placed from triage:  urine

## 2023-07-13 NOTE — MAU Provider Note (Signed)
History     CSN: [id number]  Arrival date and time: 07/13/23 1701   Event Date/Time   First Provider Initiated Contact with Patient 07/13/23 1754      Chief Complaint  Patient presents with   Headache   Hypertension   Nausea   Abdominal Pain   HPI Julie Case is a 32 y.o. [id number] at [redacted]w[redacted]d who presents with nausea, diarrhea, headache, & abdominal pain. States she has felt bad all day. Has been nauseated & had 2 episodes of watery diarrhea. No sick contacts. Came home after going grocery shopping & had right sided abdominal pain & headache. Checked her BP at home & it was elevated. Denies history of hypertension. Denies visual disturbance or epigastric pain. Reports good fetal movement. Hasn't treated symptoms.   OB History     Gravida  9   Para  5   Term  5   Preterm  0   AB  3   Living  5      SAB  2   IAB  0   Ectopic  1   Multiple  0   Live Births  5           Past Medical History:  Diagnosis Date   Depression    Epilepsy (HCC)     Past Surgical History:  Procedure Laterality Date   CHOLECYSTECTOMY     ECTOPIC PREGNANCY SURGERY     with right tube removal    Family History  Problem Relation Age of Onset   Diabetes Maternal Grandmother    Diabetes Maternal Grandfather    Hypertension Father    Hypercholesterolemia Father    Hypertension Mother    Hypercholesterolemia Mother    Schizophrenia Brother    Hypertension Other    Diabetes Other     Social History   Tobacco Use   Smoking status: Every Day    Current packs/day: 0.50    Types: Cigarettes   Smokeless tobacco: Never   Tobacco comments:    1/2 pack a day  Substance Use Topics   Alcohol use: Not Currently   Drug use: Not Currently    Allergies:  Allergies  Allergen Reactions   Penicillins Shortness Of Breath and Rash   Naproxen Nausea And Vomiting    Medications Prior to Admission  Medication Sig Dispense Refill Last Dose/Taking   Acetaminophen (TYLENOL PO) Take  by mouth.   07/13/2023   Blood Pressure Monitor MISC For regular home bp monitoring during pregnancy 1 each 0 07/13/2023   Magnesium Glycinate 120 MG CAPS Take 3 tablets by mouth daily. 90 capsule 5 07/13/2023   Prenatal Vit-Fe Fumarate-FA (PRENATAL VITAMIN PO) Take by mouth.   07/13/2023   fluticasone (FLONASE) 50 MCG/ACT nasal spray Place 2 sprays into both nostrils daily. (Patient not taking: Reported on 06/05/2023) 16 g 2 Unknown   nitrofurantoin, macrocrystal-monohydrate, (MACROBID) 100 MG capsule Take 1 capsule (100 mg total) by mouth 2 (two) times daily. 14 capsule 0 Unknown    Review of Systems  All other systems reviewed and are negative.  Physical Exam   Blood pressure 112/72, pulse 100, temperature 98.1 F (36.7 C), temperature source Oral, resp. rate 17, height 5\' 5"  (1.651 m), weight 100.4 kg, last menstrual period 01/15/2023, SpO2 100%.  Physical Exam Vitals and nursing note reviewed.  Constitutional:      General: She is not in acute distress.    Appearance: She is well-developed. She is not ill-appearing.  HENT:  Head: Normocephalic and atraumatic.  Eyes:     General: No scleral icterus.       Right eye: No discharge.        Left eye: No discharge.     Conjunctiva/sclera: Conjunctivae normal.  Pulmonary:     Effort: Pulmonary effort is normal. No respiratory distress.  Abdominal:     Tenderness: There is no abdominal tenderness. There is no guarding or rebound.     Comments: gravid  Neurological:     General: No focal deficit present.     Mental Status: She is alert.  Psychiatric:        Mood and Affect: Mood normal.        Behavior: Behavior normal.    NST:  Baseline: 145 bpm, Variability: Good {> 6 bpm), Accelerations: Non-reactive but appropriate for gestational age, and Decelerations: Absent  MAU Course  Procedures Results for orders placed or performed during the hospital encounter of 07/13/23 (from the past 24 hours)  Urinalysis, Routine w reflex  microscopic -Urine, Clean Catch     Status: Abnormal   Collection Time: 07/13/23  5:24 PM  Result Value Ref Range   Color, Urine YELLOW YELLOW   APPearance CLEAR CLEAR   Specific Gravity, Urine 1.003 (L) 1.005 - 1.030   pH 7.0 5.0 - 8.0   Glucose, UA NEGATIVE NEGATIVE mg/dL   Hgb urine dipstick NEGATIVE NEGATIVE   Bilirubin Urine NEGATIVE NEGATIVE   Ketones, ur NEGATIVE NEGATIVE mg/dL   Protein, ur NEGATIVE NEGATIVE mg/dL   Nitrite NEGATIVE NEGATIVE   Leukocytes,Ua TRACE (A) NEGATIVE   RBC / HPF 0-5 0 - 5 RBC/hpf   WBC, UA 0-5 0 - 5 WBC/hpf   Bacteria, UA FEW (A) NONE SEEN   Squamous Epithelial / HPF 6-10 0 - 5 /HPF  Protein / creatinine ratio, urine     Status: None   Collection Time: 07/13/23  5:24 PM  Result Value Ref Range   Creatinine, Urine 29 mg/dL   Total Protein, Urine <6 mg/dL   Protein Creatinine Ratio        0.00 - 0.15 mg/mg[Cre]  CBC     Status: Abnormal   Collection Time: 07/13/23  5:46 PM  Result Value Ref Range   WBC 10.7 (H) 4.0 - 10.5 K/uL   RBC 4.02 3.87 - 5.11 MIL/uL   Hemoglobin 11.9 (L) 12.0 - 15.0 g/dL   HCT 40.9 (L) 81.1 - 91.4 %   MCV 84.1 80.0 - 100.0 fL   MCH 29.6 26.0 - 34.0 pg   MCHC 35.2 30.0 - 36.0 g/dL   RDW 78.2 95.6 - 21.3 %   Platelets 188 150 - 400 K/uL   nRBC 0.0 0.0 - 0.2 %  Comprehensive metabolic panel     Status: Abnormal   Collection Time: 07/13/23  5:46 PM  Result Value Ref Range   Sodium 136 135 - 145 mmol/L   Potassium 3.7 3.5 - 5.1 mmol/L   Chloride 106 98 - 111 mmol/L   CO2 19 (L) 22 - 32 mmol/L   Glucose, Bld 92 70 - 99 mg/dL   BUN <5 (L) 6 - 20 mg/dL   Creatinine, Ser 0.86 (L) 0.44 - 1.00 mg/dL   Calcium 9.0 8.9 - 57.8 mg/dL   Total Protein 6.2 (L) 6.5 - 8.1 g/dL   Albumin 3.0 (L) 3.5 - 5.0 g/dL   AST 12 (L) 15 - 41 U/L   ALT 7 0 - 44 U/L   Alkaline Phosphatase 71  38 - 126 U/L   Total Bilirubin 0.4 <1.2 mg/dL   GFR, Estimated >16 >10 mL/min   Anion gap 11 5 - 15    MDM Elevated BP x 2 at home. Normotensive in  MAU. Preeclampsia labs collected & normal.   TOCO quiet. Abdomen soft & non tender. Denies contractions  Treated with tylenol & zofran. Reports improvement in symptoms & requests discharge home with rx antiemetic.   Assessment and Plan   1. Diarrhea of presumed infectious origin  -Discussed bland diet & good hydration -Rx zofran for nausea  2. Elevated BP without diagnosis of hypertension  -Normotensive here. Discussed s/s of preeclampsia & reasons to return. Reviewed how to check BP at home.   3. Pregnancy headache in second trimester  -H/a improved with tylenol  4. [redacted] weeks gestation of pregnancy      Julie Case 07/13/2023, 7:06 PM

## 2023-07-31 NOTE — L&D Delivery Note (Signed)
 OB/GYN Faculty Practice Delivery Note  Julie Case is a 33 y.o. [id number] s/p SVD at [redacted]w[redacted]d. She was admitted for IOL 2/2 gHTN.   ROM: 4h 73m with clear fluid GBS Status: --Theda Sers (02/27 1600) Maximum Maternal Temperature: Temp (24hrs), Avg:98 F (36.7 C), Min:97.8 F (36.6 C), Max:98.2 F (36.8 C)   Labor Progress: Initial SVE: 5/60/-2. AROM and Pitocin required. She then progressed to complete.   Delivery Date/Time: 10/06/23 0737 Delivery: Called to room and patient was complete and feeling urge to push. Head delivered OA to LOA. No nuchal cord present. Shoulder and body delivered in usual fashion. Infant with spontaneous cry, placed on mother's abdomen, dried and stimulated. Cord clamped x 2 after +1-minute delay, and cut by FOB. Cord gases not obtained. Cord blood drawn. Placenta delivered spontaneously with gentle cord traction. Fundus firm with massage and Pitocin. Labia, perineum, and vagina inspected with no lacerations. Mom and baby to postpartum. Baby Weight: pending  Placenta: 3 vessel, intact. Sent to L&D Complications: None Lacerations: None EBL: 62 mL Anesthesia: epidural  Infant: baby girl Sophia APGAR (1 MIN): 9  APGAR (5 MINS): 10  APGAR (10 MINS):    Joanne Gavel, MD [facility] Family Medicine Fellow, [facility] for [facility], [facility] 10/06/2023, 9:37 AM

## 2023-08-01 ENCOUNTER — Ambulatory Visit: Payer: Medicaid Other | Admitting: Women's Health

## 2023-08-01 ENCOUNTER — Encounter: Payer: Self-pay | Admitting: Women's Health

## 2023-08-01 ENCOUNTER — Other Ambulatory Visit: Payer: Medicaid Other

## 2023-08-01 VITALS — BP 108/72 | HR 86 | Wt 217.4 lb

## 2023-08-01 DIAGNOSIS — Z131 Encounter for screening for diabetes mellitus: Secondary | ICD-10-CM

## 2023-08-01 DIAGNOSIS — Z3A28 28 weeks gestation of pregnancy: Secondary | ICD-10-CM | POA: Diagnosis not present

## 2023-08-01 DIAGNOSIS — Z3480 Encounter for supervision of other normal pregnancy, unspecified trimester: Secondary | ICD-10-CM | POA: Diagnosis not present

## 2023-08-01 DIAGNOSIS — Z3A27 27 weeks gestation of pregnancy: Secondary | ICD-10-CM | POA: Diagnosis not present

## 2023-08-01 DIAGNOSIS — Z3483 Encounter for supervision of other normal pregnancy, third trimester: Secondary | ICD-10-CM | POA: Diagnosis not present

## 2023-08-01 NOTE — Progress Notes (Addendum)
 LOW-RISK PREGNANCY VISIT Patient name: Julie Case MRN [id number]  Date of birth: May 19, 1991 Chief Complaint:   Routine Prenatal Visit (PN2/ tdap information given)  History of Present Illness:   Julie Case is a 33 y.o. [id number] female at [redacted]w[redacted]d with an Estimated Date of Delivery: 10/22/23 being seen today for ongoing management of a low-risk pregnancy.   Today she reports  sciatica . Contractions: Not present.  .  Movement: Present. denies leaking of fluid.     08/01/2023    8:50 AM 04/17/2023    2:01 PM 03/05/2023    2:16 PM 01/28/2023    2:40 PM 01/28/2023    2:24 PM  Depression screen PHQ 2/9  Decreased Interest 0 0 0 0 0  Down, Depressed, Hopeless 0 1 0 0 0  PHQ - 2 Score 0 1 0 0 0  Altered sleeping 1 1 0 0 0  Tired, decreased energy 0 1 0 2 0  Change in appetite 1 1 0 1 0  Feeling bad or failure about yourself  0 0 0 0 0  Trouble concentrating 0 0 0 0 0  Moving slowly or fidgety/restless 0 0 0 0 0  Suicidal thoughts 0 0 0 0 0  PHQ-9 Score 2 4 0 3 0  Difficult doing work/chores   Not difficult at all Not difficult at all Not difficult at all        04/17/2023    2:01 PM 03/05/2023    2:16 PM 01/28/2023    2:40 PM 01/28/2023    2:24 PM  GAD 7 : Generalized Anxiety Score  Nervous, Anxious, on Edge 0 0 0 0  Control/stop worrying 0 0 1 0  Worry too much - different things 0 0 0 0  Trouble relaxing 0 0 1 0  Restless 0 0 0 0  Easily annoyed or irritable 0 0 0 0  Afraid - awful might happen 0 0 0 0  Total GAD 7 Score 0 0 2 0  Anxiety Difficulty  Not difficult at all Somewhat difficult Not difficult at all      Review of Systems:   Pertinent items are noted in HPI Denies abnormal vaginal discharge w/ itching/odor/irritation, headaches, visual changes, shortness of breath, chest pain, abdominal pain, severe nausea/vomiting, or problems with urination or bowel movements unless otherwise stated above. Pertinent History Reviewed:  Reviewed past medical,surgical, social, obstetrical and  family history.  Reviewed problem list, medications and allergies. Physical Assessment:   Vitals:   08/01/23 0845  BP: 108/72  Pulse: 86  Weight: 217 lb 6.4 oz (98.6 kg)  Body mass index is 36.18 kg/m.        Physical Examination:   General appearance: Well appearing, and in no distress  Mental status: Alert, oriented to person, place, and time  Skin: Warm & dry  Cardiovascular: Normal heart rate noted  Respiratory: Normal respiratory effort, no distress  Abdomen: Soft, gravid, nontender  Pelvic: Cervical exam deferred         Extremities: Edema: None  Fetal Status: Fetal Heart Rate (bpm): 138 Fundal Height: 28 cm Movement: Present    Chaperone: N/A   No results found for this or any previous visit (from the past 24 hours).  Assessment & Plan:  1) Low-risk pregnancy [id number] at [redacted]w[redacted]d with an Estimated Date of Delivery: 10/22/23   2) PG BMI 38, EFW 32wk  3) Fetal mild RPD> f/u u/s 32wk  4) Wants BTS>reviewed risks/benefits, consent signed today  5) Sciatica> gave printed prevention/relief measures    Meds: No orders of the defined types were placed in this encounter.  Labs/procedures today: PN2 and declines tdap today, maybe next visit  Plan:  Continue routine obstetrical care  Next visit: prefers in person    Reviewed: Preterm labor symptoms and general obstetric precautions including but not limited to vaginal bleeding, contractions, leaking of fluid and fetal movement were reviewed in detail with the patient.  All questions were answered. Does have home bp cuff. Office bp cuff given: not applicable. Check bp weekly, let us  know if consistently >140 and/or >90.  Follow-up: Return in about 2 weeks (around 08/15/2023) for LROB, CNM, in person; then 4wks from now EFW u/s and LROB w/ CNM.  Future Appointments  Date Time Provider Department Center  08/01/2023  9:50 AM Kizzie Suzen SAUNDERS, CNM [facility]  08/05/2023 10:40 AM Zarwolo, Gloria, FNP [facility]    No orders  of the defined types were placed in this encounter.  Suzen SAUNDERS Kizzie CNM, [facility] 08/01/2023 9:12 AM

## 2023-08-01 NOTE — Patient Instructions (Addendum)
 Julie Case, thank you for choosing our office today! We appreciate the opportunity to meet your healthcare needs. You may receive a short survey by mail, e-mail, or through [employer]. If you are happy with your care we would appreciate if you could take just a few minutes to complete the survey questions. We read all of your comments and take your feedback very seriously. Thank you again for choosing our office.  Center for [employer] Team at [facility]  [facility] at [facility] ([address] [place], [place] [address]) Entrance C, located off of [address] Free 24/7 valet parking   CLASSES: Go to [email or web address] to register for classes (childbirth, breastfeeding, waterbirth, infant CPR, daddy bootcamp, etc.)  Call the office [phone]) or go to [facility] if: You begin to have strong, frequent contractions Your water breaks.  Sometimes it is a big gush of fluid, sometimes it is just a trickle that keeps getting your panties wet or running down your legs You have vaginal bleeding.  It is normal to have a small amount of spotting if your cervix was checked.  You don't feel your baby moving like normal.  If you don't, get you something to eat and drink and lay down and focus on feeling your baby move.   If your baby is still not moving like normal, you should call the office or go to [facility].  Call the office [phone]) or go to [facility] hospital for these signs of pre-eclampsia: Severe headache that does not go away with Tylenol  Visual changes- seeing spots, double, blurred vision Pain under your right breast or upper abdomen that does not go away with Tums or heartburn medicine Nausea and/or vomiting Severe swelling in your hands, feet, and face   Tdap Vaccine It is recommended that you get the Tdap vaccine during the third trimester of EACH pregnancy to help protect your baby from getting pertussis (whooping cough) 27-36 weeks is the BEST time to do  this so that you can pass the protection on to your baby. During pregnancy is better than after pregnancy, but if you are unable to get it during pregnancy it will be offered at the hospital.  You can get this vaccine with us , at the health department, your family doctor, or some local pharmacies Everyone who will be around your baby should also be up-to-date on their vaccines before the baby comes. Adults (who are not pregnant) only need 1 dose of Tdap during adulthood.   [facility] Pediatricians/Family Doctors [facility]): [address] Luba BROCKS, [phone]           [facility]: [address], [phone]                [facility] Medicine [facility]): [address], [phone] (call to ask if accepting patients) [facility] Department: [address], [place], [phone]    [facility] Pediatricians/Family Doctors [facility]): [phone], [phone] [facility]: [address] [place], [phone] [facility] [place]: [address], [phone]  [facility] Doctors  [facility]): [phone] [facility]: [address], [phone]   [facility] Doctors [facility]: [address], [phone]  [facility] Family Doctors  [facility]: [phone], [phone]  Home Blood Pressure Monitoring for Patients   Your provider has recommended that you check your  blood pressure (BP) at least once a week at home. If you do not have a blood pressure cuff at home, one will be provided for you. Contact your provider if you have not received your monitor within 1 week.   Helpful Tips for Accurate Home Blood Pressure Checks  Don't smoke, exercise, or drink caffeine  30 minutes before checking your BP Use the restroom before checking your BP (a full bladder can raise your  pressure) Relax in a comfortable upright chair Feet on the ground Left arm resting comfortably on a flat surface at the level of your heart Legs uncrossed Back supported Sit quietly and don't talk Place the cuff on your bare arm Adjust snuggly, so that only two fingertips can fit between your skin and the top of the cuff Check 2 readings separated by at least one minute Keep a log of your BP readings For a visual, please reference this diagram: http://ccnc.care/bpdiagram  Provider Name: [facility]     Phone: [phone]  Zone 1: ALL CLEAR  Continue to monitor your symptoms:  BP reading is less than 140 (top number) or less than 90 (bottom number)  No right upper stomach pain No headaches or seeing spots No feeling nauseated or throwing up No swelling in face and hands  Zone 2: CAUTION Call your doctor's office for any of the following:  BP reading is greater than 140 (top number) or greater than 90 (bottom number)  Stomach pain under your ribs in the middle or right side Headaches or seeing spots Feeling nauseated or throwing up Swelling in face and hands  Zone 3: EMERGENCY  Seek immediate medical care if you have any of the following:  BP reading is greater than160 (top number) or greater than 110 (bottom number) Severe headaches not improving with Tylenol  Serious difficulty catching your breath Any worsening symptoms from Zone 2   Third Trimester of Pregnancy The third trimester is from week 29 through week 42, months 7 through 9. The third trimester is a time when the fetus is growing rapidly. At the end of the ninth month, the fetus is about 20 inches in length and weighs 6-10 pounds.  BODY CHANGES Your body goes through many changes during pregnancy. The changes vary from woman to woman.  Your weight will continue to increase. You can expect to gain 25-35 pounds (11-16 kg) by the end of the pregnancy. You may begin to get stretch marks on your hips, abdomen,  and breasts. You may urinate more often because the fetus is moving lower into your pelvis and pressing on your bladder. You may develop or continue to have heartburn as a result of your pregnancy. You may develop constipation because certain hormones are causing the muscles that push waste through your intestines to slow down. You may develop hemorrhoids or swollen, bulging veins (varicose veins). You may have pelvic pain because of the weight gain and pregnancy hormones relaxing your joints between the bones in your pelvis. Backaches may result from overexertion of the muscles supporting your posture. You may have changes in your hair. These can include thickening of your hair, rapid growth, and changes in texture. Some women also have hair loss during or after pregnancy, or hair that feels dry or thin. Your hair will most likely return to normal after your baby is born. Your breasts will continue to grow and be tender. A yellow discharge may leak from your breasts called colostrum. Your belly button may stick out. You may  feel short of breath because of your expanding uterus. You may notice the fetus dropping, or moving lower in your abdomen. You may have a bloody mucus discharge. This usually occurs a few days to a week before labor begins. Your cervix becomes thin and soft (effaced) near your due date. WHAT TO EXPECT AT YOUR PRENATAL EXAMS  You will have prenatal exams every 2 weeks until week 36. Then, you will have weekly prenatal exams. During a routine prenatal visit: You will be weighed to make sure you and the fetus are growing normally. Your blood pressure is taken. Your abdomen will be measured to track your baby's growth. The fetal heartbeat will be listened to. Any test results from the previous visit will be discussed. You may have a cervical check near your due date to see if you have effaced. At around 36 weeks, your caregiver will check your cervix. At the same time, your  caregiver will also perform a test on the secretions of the vaginal tissue. This test is to determine if a type of bacteria, Group B streptococcus, is present. Your caregiver will explain this further. Your caregiver may ask you: What your birth plan is. How you are feeling. If you are feeling the baby move. If you have had any abnormal symptoms, such as leaking fluid, bleeding, severe headaches, or abdominal cramping. If you have any questions. Other tests or screenings that may be performed during your third trimester include: Blood tests that check for low iron levels (anemia). Fetal testing to check the health, activity level, and growth of the fetus. Testing is done if you have certain medical conditions or if there are problems during the pregnancy. FALSE LABOR You may feel small, irregular contractions that eventually go away. These are called Braxton Hicks contractions, or false labor. Contractions may last for hours, days, or even weeks before true labor sets in. If contractions come at regular intervals, intensify, or become painful, it is best to be seen by your caregiver.  SIGNS OF LABOR  Menstrual-like cramps. Contractions that are 5 minutes apart or less. Contractions that start on the top of the uterus and spread down to the lower abdomen and back. A sense of increased pelvic pressure or back pain. A watery or bloody mucus discharge that comes from the vagina. If you have any of these signs before the 37th week of pregnancy, call your caregiver right away. You need to go to the hospital to get checked immediately. HOME CARE INSTRUCTIONS  Avoid all smoking, herbs, alcohol, and unprescribed drugs. These chemicals affect the formation and growth of the baby. Follow your caregiver's instructions regarding medicine use. There are medicines that are either safe or unsafe to take during pregnancy. Exercise only as directed by your caregiver. Experiencing uterine cramps is a good sign to  stop exercising. Continue to eat regular, healthy meals. Wear a good support bra for breast tenderness. Do not use hot tubs, steam rooms, or saunas. Wear your seat belt at all times when driving. Avoid raw meat, uncooked cheese, cat litter boxes, and soil used by cats. These carry germs that can cause birth defects in the baby. Take your prenatal vitamins. Try taking a stool softener (if your caregiver approves) if you develop constipation. Eat more high-fiber foods, such as fresh vegetables or fruit and whole grains. Drink plenty of fluids to keep your urine clear or pale yellow. Take warm sitz baths to soothe any pain or discomfort caused by hemorrhoids. Use hemorrhoid cream if  your caregiver approves. If you develop varicose veins, wear support hose. Elevate your feet for 15 minutes, 3-4 times a day. Limit salt in your diet. Avoid heavy lifting, wear low heal shoes, and practice good posture. Rest a lot with your legs elevated if you have leg cramps or low back pain. Visit your dentist if you have not gone during your pregnancy. Use a soft toothbrush to brush your teeth and be gentle when you floss. A sexual relationship may be continued unless your caregiver directs you otherwise. Do not travel far distances unless it is absolutely necessary and only with the approval of your caregiver. Take prenatal classes to understand, practice, and ask questions about the labor and delivery. Make a trial run to the hospital. Pack your hospital bag. Prepare the baby's nursery. Continue to go to all your prenatal visits as directed by your caregiver. SEEK MEDICAL CARE IF: You are unsure if you are in labor or if your water has broken. You have dizziness. You have mild pelvic cramps, pelvic pressure, or nagging pain in your abdominal area. You have persistent nausea, vomiting, or diarrhea. You have a bad smelling vaginal discharge. You have pain with urination. SEEK IMMEDIATE MEDICAL CARE IF:  You  have a fever. You are leaking fluid from your vagina. You have spotting or bleeding from your vagina. You have severe abdominal cramping or pain. You have rapid weight loss or gain. You have shortness of breath with chest pain. You notice sudden or extreme swelling of your face, hands, ankles, feet, or legs. You have not felt your baby move in over an hour. You have severe headaches that do not go away with medicine. You have vision changes. Document Released: 07/10/2001 Document Revised: 07/21/2013 Document Reviewed: 09/16/2012 [facility] Patient Information 2015 [place], [place]. This information is not intended to replace advice given to you by your health care provider. Make sure you discuss any questions you have with your health care provider.  Sciatica Rehab Ask your health care provider which exercises are safe for you. Do exercises exactly as told by your health care provider and adjust them as directed. It is normal to feel mild stretching, pulling, tightness, or discomfort as you do these exercises. Stop right away if you feel sudden pain or your pain gets worse. Do not begin these exercises until told by your health care provider. Stretching and range-of-motion exercises These exercises warm up your muscles and joints and improve the movement and flexibility of your hips and back. These exercises also help to relieve pain, numbness, and tingling. Sciatic nerve glide  Sit in a chair with your head facing down toward your chest. Place your hands behind your back. Let your shoulders slump forward. Slowly straighten one of your legs while you tilt your head back as if you are looking toward the ceiling. Only straighten your leg as far as you can without making your symptoms worse. Hold this position for __________ seconds. Slowly return your leg and head back to the starting position. Repeat with your other leg. Repeat __________ times. Complete this exercise __________ times a  day. Knee to chest with hip adduction and internal rotation  Lie on your back on a firm surface with both legs straight. Bend one of your knees and move it up toward your chest until you feel a gentle stretch in your lower back and buttock. Then, move your knee toward the shoulder that is on the opposite side from your leg. This is hip adduction and  internal rotation. Hold your leg in this position by holding on to the front of your knee. Hold this position for __________ seconds. Slowly return to the starting position. Repeat with your other leg. Repeat __________ times. Complete this exercise __________ times a day. Prone extension on elbows  Lie on your abdomen on a firm surface. A bed may be too soft for this exercise. Prop yourself up on your elbows. Use your arms to help lift your chest up until you feel a gentle stretch in your abdomen and your lower back. This will place some of your body weight on your elbows. If this is uncomfortable, try stacking pillows under your chest. Your hips should stay down, against the surface that you are lying on. Keep your hip and back muscles relaxed. Hold this position for __________ seconds. Slowly relax your upper body and return to the starting position. Repeat __________ times. Complete this exercise __________ times a day. Strengthening exercises These exercises build strength and endurance in your back. Endurance is the ability to use your muscles for a long time, even after they get tired. Pelvic tilt This exercise strengthens the muscles that lie deep in the abdomen. Lie on your back on a firm surface. Bend your knees and keep your feet flat on the surface. Tense your abdominal muscles. Tip your pelvis up toward the ceiling and flatten your lower back into the firm surface. To help with this exercise, you may place a small towel under your lower back and try to push your back into the towel. Hold this position for __________ seconds. Let  your muscles relax completely before you repeat this exercise. Repeat __________ times. Complete this exercise __________ times a day. Alternating arm and leg raises  Get on your hands and knees on a firm surface. If you are on a hard floor, you may want to use padding, such as an exercise mat, to cushion your knees. Line up your arms and legs. Your hands should be directly below your shoulders, and your knees should be directly below your hips. Lift your left leg behind you. At the same time, raise your right arm and straighten it in front of you. Do not lift your leg higher than your hip. Do not lift your arm higher than your shoulder. Keep your abdominal and back muscles tight. Keep your hips facing the ground. Do not arch your back. Keep your balance carefully, and do not hold your breath. Hold this position for __________ seconds. Slowly return to the starting position. Repeat with your right leg and your left arm. Repeat __________ times. Complete this exercise __________ times a day. Posture and body mechanics Good posture and healthy body mechanics can help to relieve stress in your body's tissues and joints. Body mechanics refers to the movements and positions of your body while you do your daily activities. Posture is part of body mechanics. Good posture means: Your spine is in its natural S-curve position (neutral). Your shoulders are pulled back slightly. Your head is not tipped forward. Follow these guidelines to improve your posture and body mechanics in your everyday activities. Standing  When standing, keep your spine neutral and your feet about hip width apart. Keep a slight bend in your knees. Your ears, shoulders, and hips should line up. When you do a task in which you stand in one place for a long time, place one foot up on a stable object that is 2-4 inches (5-10 cm) high, such as a footstool. This  helps keep your spine neutral. Sitting  When sitting, keep your  spine neutral and keep your feet flat on the floor. Use a footrest, if necessary, and keep your thighs parallel to the floor. Avoid rounding your shoulders, and avoid tilting your head forward. When working at a desk or a computer, keep your desk at a height where your hands are slightly lower than your elbows. Slide your chair under your desk so you are close enough to maintain good posture. When working at a computer, place your monitor at a height where you are looking straight ahead and you do not have to tilt your head forward or downward to look at the screen. Resting  When lying down and resting, avoid positions that are most painful for you. If you have pain with activities such as sitting, bending, stooping, or squatting, lie in a position in which your body does not bend very much. For example, avoid curling up on your side with your arms and knees near your chest (fetal position). If you have pain with activities such as standing for a long time or reaching with your arms, lie with your spine in a neutral position and bend your knees slightly. Try the following positions: Lying on your side with a pillow between your knees. Lying on your back with a pillow under your knees. Lifting  When lifting objects, keep your feet at least shoulder width apart and tighten your abdominal muscles. Bend your knees and hips and keep your spine neutral. It is important to lift using the strength of your legs, not your back. Do not lock your knees straight out. Always ask for help to lift heavy or awkward objects. This information is not intended to replace advice given to you by your health care provider. Make sure you discuss any questions you have with your health care provider. Document Revised: 10/24/2021 Document Reviewed: 10/24/2021 [employer] Patient Education  2024 [employer].

## 2023-08-02 LAB — RPR: RPR Ser Ql: NONREACTIVE

## 2023-08-02 LAB — GLUCOSE TOLERANCE, 2 HOURS W/ 1HR
Glucose, 1 hour: 158 mg/dL (ref 70–179)
Glucose, 2 hour: 133 mg/dL (ref 70–152)
Glucose, Fasting: 89 mg/dL (ref 70–91)

## 2023-08-02 LAB — ANTIBODY SCREEN: Antibody Screen: NEGATIVE

## 2023-08-02 LAB — HIV ANTIBODY (ROUTINE TESTING W REFLEX): HIV Screen 4th Generation wRfx: NONREACTIVE

## 2023-08-04 ENCOUNTER — Encounter: Payer: Self-pay | Admitting: Family Medicine

## 2023-08-05 ENCOUNTER — Ambulatory Visit (INDEPENDENT_AMBULATORY_CARE_PROVIDER_SITE_OTHER): Payer: Medicaid Other | Admitting: Family Medicine

## 2023-08-05 ENCOUNTER — Encounter: Payer: Self-pay | Admitting: Family Medicine

## 2023-08-05 VITALS — BP 112/70 | HR 95 | Ht 65.0 in | Wt 218.0 lb

## 2023-08-05 DIAGNOSIS — F3289 Other specified depressive episodes: Secondary | ICD-10-CM | POA: Diagnosis not present

## 2023-08-05 DIAGNOSIS — B349 Viral infection, unspecified: Secondary | ICD-10-CM | POA: Diagnosis not present

## 2023-08-05 NOTE — Progress Notes (Signed)
 Established Patient Office Visit  Subjective:  Patient ID: Julie Case, female    DOB: [birth year]  Age: 33 y.o. MRN: [id number]  CC:  Chief Complaint  Patient presents with   Care Management    5 month f/u    HPI Julie Case is a 33 y.o. female with past medical history of restless leg syndrome obesity and depression presents for f/u of  chronic medical conditions. For the details of today's visit, please refer to the assessment and plan.  The patient reports that she is currently [redacted] weeks pregnant and is following up with her gynecologist.     Past Medical History:  Diagnosis Date   Depression    Epilepsy (HCC)     Past Surgical History:  Procedure Laterality Date   CHOLECYSTECTOMY     ECTOPIC PREGNANCY SURGERY     with right tube removal    Family History  Problem Relation Age of Onset   Diabetes Maternal Grandmother    Diabetes Maternal Grandfather    Hypertension Father    Hypercholesterolemia Father    Hypertension Mother    Hypercholesterolemia Mother    Schizophrenia Brother    Hypertension Other    Diabetes Other     Social History   Socioeconomic History   Marital status: Single    Spouse name: Not on file   Number of children: Not on file   Years of education: Not on file   Highest education level: 11th grade  Occupational History   Not on file  Tobacco Use   Smoking status: Every Day    Current packs/day: 0.50    Types: Cigarettes   Smokeless tobacco: Never   Tobacco comments:    1/2 pack a day  Vaping Use   Vaping status: Not on file  Substance and Sexual Activity   Alcohol use: Not Currently   Drug use: Not Currently   Sexual activity: Yes    Birth control/protection: None  Other Topics Concern   Not on file  Social History Narrative   Not on file   Social Drivers of Health   Financial Resource Strain: Low Risk  (08/02/2023)   Overall Financial Resource Strain (CARDIA)    Difficulty of Paying Living Expenses: Not hard at all   Food Insecurity: No Food Insecurity (08/02/2023)   Hunger Vital Sign    Worried About Running Out of Food in the Last Year: Never true    Ran Out of Food in the Last Year: Never true  Transportation Needs: No Transportation Needs (08/02/2023)   PRAPARE - Administrator, Civil Service (Medical): No    Lack of Transportation (Non-Medical): No  Physical Activity: Unknown (08/02/2023)   Exercise Vital Sign    Days of Exercise per Week: 3 days    Minutes of Exercise per Session: Patient declined  Stress: No Stress Concern Present (08/02/2023)   [employer] of Occupational Health - Occupational Stress Questionnaire    Feeling of Stress : Only a little  Social Connections: Unknown (08/02/2023)   Social Connection and Isolation Panel [NHANES]    Frequency of Communication with Friends and Family: Twice a week    Frequency of Social Gatherings with Friends and Family: Twice a week    Attends Religious Services: Patient declined    Database Administrator or Organizations: No    Attends Engineer, Structural: Not on file    Marital Status: Living with partner  Intimate Partner Violence: Not on file  Outpatient Medications Prior to Visit  Medication Sig Dispense Refill   Acetaminophen  (TYLENOL  PO) Take by mouth.     Blood Pressure Monitor MISC For regular home bp monitoring during pregnancy 1 each 0   fluticasone  (FLONASE ) 50 MCG/ACT nasal spray Place 2 sprays into both nostrils daily. 16 g 2   Magnesium  Glycinate 120 MG CAPS Take 3 tablets by mouth daily. 90 capsule 5   ondansetron  (ZOFRAN -ODT) 4 MG disintegrating tablet Take 1 tablet (4 mg total) by mouth every 8 (eight) hours as needed for nausea or vomiting. 20 tablet 0   Prenatal Vit-Fe Fumarate-FA (PRENATAL VITAMIN PO) Take by mouth.     nitrofurantoin , macrocrystal-monohydrate, (MACROBID ) 100 MG capsule Take 1 capsule (100 mg total) by mouth 2 (two) times daily. 14 capsule 0   No facility-administered medications  prior to visit.    Allergies  Allergen Reactions   Penicillins Shortness Of Breath and Rash   Naproxen Nausea And Vomiting    ROS Review of Systems  Constitutional:  Negative for chills and fever.  Eyes:  Negative for visual disturbance.  Respiratory:  Negative for chest tightness and shortness of breath.   Neurological:  Negative for dizziness and headaches.      Objective:    Physical Exam HENT:     Head: Normocephalic.     Mouth/Throat:     Mouth: Mucous membranes are moist.  Cardiovascular:     Rate and Rhythm: Normal rate.     Heart sounds: Normal heart sounds.  Pulmonary:     Effort: Pulmonary effort is normal.     Breath sounds: Normal breath sounds.  Neurological:     Mental Status: She is alert.     BP 112/70   Pulse 95   Ht 5' 5 (1.651 m)   Wt 218 lb (98.9 kg)   LMP 01/15/2023 (Exact Date)   SpO2 96%   BMI 36.28 kg/m  Wt Readings from Last 3 Encounters:  08/05/23 218 lb (98.9 kg)  08/01/23 217 lb 6.4 oz (98.6 kg)  07/13/23 221 lb 4.8 oz (100.4 kg)    Lab Results  Component Value Date   TSH 1.470 03/14/2023   Lab Results  Component Value Date   WBC 10.7 (H) 07/13/2023   HGB 11.9 (L) 07/13/2023   HCT 33.8 (L) 07/13/2023   MCV 84.1 07/13/2023   PLT 188 07/13/2023   Lab Results  Component Value Date   NA 136 07/13/2023   K 3.7 07/13/2023   CO2 19 (L) 07/13/2023   GLUCOSE 92 07/13/2023   BUN <5 (L) 07/13/2023   CREATININE 0.36 (L) 07/13/2023   BILITOT 0.4 07/13/2023   ALKPHOS 71 07/13/2023   AST 12 (L) 07/13/2023   ALT 7 07/13/2023   PROT 6.2 (L) 07/13/2023   ALBUMIN 3.0 (L) 07/13/2023   CALCIUM 9.0 07/13/2023   ANIONGAP 11 07/13/2023   EGFR 123 03/14/2023   Lab Results  Component Value Date   CHOL 185 03/14/2023   Lab Results  Component Value Date   HDL 40 03/14/2023   Lab Results  Component Value Date   LDLCALC 121 (H) 03/14/2023   Lab Results  Component Value Date   TRIG 132 03/14/2023   Lab Results  Component  Value Date   CHOLHDL 4.6 (H) 03/14/2023   Lab Results  Component Value Date   HGBA1C 5.2 03/14/2023      Assessment & Plan:  Viral illness Assessment & Plan: The patient reports symptoms of a scratchy sore  throat that started about two days ago. No fever, chills, vomiting, diarrhea, or facial pain are noted. Depression is also not reported. She does, however, report a nonproductive cough. Encourage warm salt gargles four times daily, over-the-counter lozenges, and the use of warm tea with honey to soothe the throat. Additionally, the use of a humidifier is recommended to help with congestion and cough. The patient verbalized understanding of the care plan   Orders: -     COVID-19, Flu A+B and RSV  Other depression Assessment & Plan: She denies suicidal ideation and homicidal ideation. Nonpharmacological interventions are encouraged, including meditation, mindfulness, deep breathing exercises, ensuring adequate sleep, and maintaining a well-balanced diet.      Note: This chart has been completed using Engineer, Civil (consulting) software, and while attempts have been made to ensure accuracy, certain words and phrases may not be transcribed as intended.    Follow-up: Return in about 5 months (around 01/03/2024).   Delene Morais, FNP

## 2023-08-05 NOTE — Assessment & Plan Note (Signed)
 The patient reports symptoms of a scratchy sore throat that started about two days ago. No fever, chills, vomiting, diarrhea, or facial pain are noted. Depression is also not reported. She does, however, report a nonproductive cough. Encourage warm salt gargles four times daily, over-the-counter lozenges, and the use of warm tea with honey to soothe the throat. Additionally, the use of a humidifier is recommended to help with congestion and cough. The patient verbalized understanding of the care plan

## 2023-08-05 NOTE — Patient Instructions (Addendum)
 I appreciate the opportunity to provide care to you today!    Follow up:  5 months  Viral Illness:  Increase fluid intake and allow for plenty of rest. Take Tylenol  as needed for pain, fever, or general discomfort. Perform warm saltwater gargles 3-4 times daily to help with throat pain or discomfort. (Mix 1/2 teaspoon of salt in a glass of warm water and gargle several times daily to reduce throat inflammation and soothe irritation.) Look for sugar-free or honey-based throat lozenges to help with a sore throat. Use a humidifier at bedtime to help with cough and nasal congestion. For nasal congestion: The use of heated humidified air is a safe and effective therapy. Saline nasal sprays may also help alleviate nasal symptoms of the common cold. For cough: Treatment with honey may reduce cough frequency and severity.    Please continue to a heart-healthy diet and increase your physical activities. Try to exercise for at least five days a week.    It was a pleasure to see you and I look forward to continuing to work together on your health and well-being. Please do not hesitate to call the office if you need care or have questions about your care.  In case of emergency, please visit the Emergency Department for urgent care, or contact our clinic at [phone] to schedule an appointment. We're here to help you!   Have a wonderful day and week. With Gratitude, Mersadie Kavanaugh MSN, FNP-BC

## 2023-08-05 NOTE — Assessment & Plan Note (Signed)
 She denies suicidal ideation and homicidal ideation. Nonpharmacological interventions are encouraged, including meditation, mindfulness, deep breathing exercises, ensuring adequate sleep, and maintaining a well-balanced diet.

## 2023-08-06 ENCOUNTER — Telehealth: Payer: Self-pay

## 2023-08-06 NOTE — Telephone Encounter (Signed)
 Covid/flu/rsv test results not back yet. Will return call once reviewed.

## 2023-08-06 NOTE — Telephone Encounter (Signed)
 Copied from CRM [id number]. Topic: Clinical - Medical Advice >> Aug 06, 2023  2:47 PM Elle L wrote: Reason for CRM: The patient was calling to go over her test results. Her call back number is [phone].

## 2023-08-06 NOTE — Telephone Encounter (Signed)
 Spoke to pt let her know results are still pending.

## 2023-08-07 LAB — COVID-19, FLU A+B AND RSV
Influenza A, NAA: NOT DETECTED
Influenza B, NAA: NOT DETECTED
RSV, NAA: NOT DETECTED
SARS-CoV-2, NAA: NOT DETECTED

## 2023-08-07 NOTE — Telephone Encounter (Signed)
 Lvm letting her know results are negative for covid/rsv/flu.

## 2023-08-07 NOTE — Telephone Encounter (Signed)
Results viewed in my chart

## 2023-08-14 ENCOUNTER — Ambulatory Visit: Payer: Medicaid Other | Admitting: Advanced Practice Midwife

## 2023-08-14 ENCOUNTER — Encounter: Payer: Self-pay | Admitting: Advanced Practice Midwife

## 2023-08-14 VITALS — BP 112/82 | HR 87 | Wt 220.0 lb

## 2023-08-14 DIAGNOSIS — Z3A30 30 weeks gestation of pregnancy: Secondary | ICD-10-CM

## 2023-08-14 DIAGNOSIS — Z3483 Encounter for supervision of other normal pregnancy, third trimester: Secondary | ICD-10-CM

## 2023-08-14 DIAGNOSIS — Z3480 Encounter for supervision of other normal pregnancy, unspecified trimester: Secondary | ICD-10-CM

## 2023-08-14 DIAGNOSIS — Z23 Encounter for immunization: Secondary | ICD-10-CM | POA: Diagnosis not present

## 2023-08-14 NOTE — Progress Notes (Signed)
   LOW-RISK PREGNANCY VISIT Patient name: Julie Case MRN [id number]  Date of birth: July 17, 1991 Chief Complaint:   Routine Prenatal Visit  History of Present Illness:   Julie Case is a 33 y.o. [name] female at [redacted]w[redacted]d with an Estimated Date of Delivery: 10/22/23 being seen today for ongoing management of a low-risk pregnancy.  Today she reports no complaints. Contractions: Irregular. Vag. Bleeding: None.  Movement: Present. denies leaking of fluid. Review of Systems:   Pertinent items are noted in HPI Denies abnormal vaginal discharge w/ itching/odor/irritation, headaches, visual changes, shortness of breath, chest pain, abdominal pain, severe nausea/vomiting, or problems with urination or bowel movements unless otherwise stated above. Pertinent History Reviewed:  Reviewed past medical,surgical, social, obstetrical and family history.  Reviewed problem list, medications and allergies. Physical Assessment:   Vitals:   08/14/23 1458  BP: 112/82  Pulse: 87  Weight: 220 lb (99.8 kg)  Body mass index is 36.61 kg/m.        Physical Examination:   General appearance: Well appearing, and in no distress  Mental status: Alert, oriented to person, place, and time  Skin: Warm & dry  Cardiovascular: Normal heart rate noted  Respiratory: Normal respiratory effort, no distress  Abdomen: Soft, gravid, nontender  Pelvic: Cervical exam deferred         Extremities: Edema: None  Fetal Status: Fetal Heart Rate (bpm): 140 Fundal Height: 30 cm Movement: Present    No results found for this or any previous visit (from the past 24 hours).  Assessment & Plan:  1) Low-risk pregnancy [id number] at [redacted]w[redacted]d with an Estimated Date of Delivery: 10/22/23   2) Mild L RPD, f/u u/s scheduled for next visit  3) Wants L salpingectomy, 30d papers signed 08/01/23; if can't be done postpartum, wants it done outpt in [place] (not at [facility])   Meds: No orders of the defined types were placed in this encounter.  Labs/procedures  today: Tdap given  Plan:  Continue routine obstetrical care   Reviewed: Preterm labor symptoms and general obstetric precautions including but not limited to vaginal bleeding, contractions, leaking of fluid and fetal movement were reviewed in detail with the patient.  All questions were answered. Has home bp cuff. Check bp weekly, let us  know if >140/90.   Follow-up: Return for As scheduled.(F/u RPD)  Orders Placed This Encounter  Procedures   Tdap vaccine greater than or equal to 7yo IM   Jolayne Natter [facility] 08/14/2023 3:17 PM

## 2023-08-14 NOTE — Patient Instructions (Signed)
Betzabeth, thank you for choosing our office today! We appreciate the opportunity to meet your healthcare needs. You may receive a short survey by mail, e-mail, or through MyChart. If you are happy with your care we would appreciate if you could take just a few minutes to complete the survey questions. We read all of your comments and take your feedback very seriously. Thank you again for choosing our office.  [facility] ([address] [place], [place] [address]) Entrance C, located off of [address] Free 24/7 valet parking   CLASSES: Go to [email or web address] to register for classes (childbirth, breastfeeding, waterbirth, infant CPR, daddy bootcamp, etc.)  Call the office ([phone]) or go to [facility] if: You begin to have strong, frequent contractions Your water breaks.  Sometimes it is a big gush of fluid, sometimes it is just a trickle that keeps getting your panties wet or running down your legs You have vaginal bleeding.  It is normal to have a small amount of spotting if your cervix was checked.  You don't feel your baby moving like normal.  If you don't, get you something to eat and drink and lay down and focus on feeling your baby move.   If your baby is still not moving like normal, you should call the office or go to [facility].  Call the office ([phone]) or go to [facility] for these signs of pre-eclampsia: Severe headache that does not go away with Tylenol Visual changes- seeing spots, double, blurred vision Pain under your right breast or upper abdomen that does not go away with Tums or heartburn medicine Nausea and/or vomiting Severe swelling in your hands, feet, and face   Tdap Vaccine It is recommended that you get the Tdap vaccine during the third trimester of EACH pregnancy to help protect your baby from getting pertussis (whooping cough) 27-36 weeks is the BEST time to do  this so that you can pass the protection on to your baby. During pregnancy is better than after pregnancy, but if you are unable to get it during pregnancy it will be offered at the hospital.  You can get this vaccine with us, at the health department, your family doctor, or some local pharmacies Everyone who will be around your baby should also be up-to-date on their vaccines before the baby comes. Adults (who are not pregnant) only need 1 dose of Tdap during adulthood.   [place] [facility] Doctors [facility] ([facility]): [address], [phone]           [facility]: [address], [phone]                [facility] ([facility]): [address], [phone] (call to ask if accepting patients) [facility]: [address], [place], [phone]    [place] [facility]/Family Doctors [facility] ([facility]): [address], [phone] [facility]: [address], [phone] [facility]: [address], [phone]  [place] [facility]  [facility] ([facility]): [phone] [facility]: [address], [phone]   [place] [facility]: [address], [phone]  [place] [facility]  [facility]: [address], [phone]  Home Blood Pressure Monitoring for Patients   Your provider has recommended that you check your   blood pressure (BP) at least once a week at home. If you do not have a blood pressure cuff at home, one will be provided for you. Contact your provider if you have not received your monitor within 1 week.   Helpful Tips for Accurate Home Blood Pressure Checks  Don't smoke, exercise, or drink caffeine 30 minutes before checking your BP Use the restroom before checking your BP (a full bladder can raise your  pressure) Relax in a comfortable upright chair Feet on the ground Left arm resting comfortably on a flat surface at the level of your heart Legs uncrossed Back supported Sit quietly and don't talk Place the cuff on your bare arm Adjust snuggly, so that only two fingertips can fit between your skin and the top of the cuff Check 2 readings separated by at least one minute Keep a log of your BP readings For a visual, please reference this diagram: http://ccnc.care/bpdiagram  Provider Name: [facility]     Phone: [phone]  Zone 1: ALL CLEAR  Continue to monitor your symptoms:  BP reading is less than 140 (top number) or less than 90 (bottom number)  No right upper stomach pain No headaches or seeing spots No feeling nauseated or throwing up No swelling in face and hands  Zone 2: CAUTION Call your doctor's office for any of the following:  BP reading is greater than 140 (top number) or greater than 90 (bottom number)  Stomach pain under your ribs in the middle or right side Headaches or seeing spots Feeling nauseated or throwing up Swelling in face and hands  Zone 3: EMERGENCY  Seek immediate medical care if you have any of the following:  BP reading is greater than160 (top number) or greater than 110 (bottom number) Severe headaches not improving with Tylenol Serious difficulty catching your breath Any worsening symptoms from Zone 2   Third Trimester of Pregnancy The third trimester is from week 29 through week 42, months 7 through 9. The third trimester is a time when the fetus is growing rapidly. At the end of the ninth month, the fetus is about 20 inches in length and weighs 6-10 pounds.  BODY CHANGES Your body goes through many changes during pregnancy. The changes vary from woman to woman.  Your weight will continue to increase. You can expect to gain 25-35 pounds (11-16 kg) by the end of the pregnancy. You may begin to get stretch marks on your hips, abdomen,  and breasts. You may urinate more often because the fetus is moving lower into your pelvis and pressing on your bladder. You may develop or continue to have heartburn as a result of your pregnancy. You may develop constipation because certain hormones are causing the muscles that push waste through your intestines to slow down. You may develop hemorrhoids or swollen, bulging veins (varicose veins). You may have pelvic pain because of the weight gain and pregnancy hormones relaxing your joints between the bones in your pelvis. Backaches may result from overexertion of the muscles supporting your posture. You may have changes in your hair. These can include thickening of your hair, rapid growth, and changes in texture. Some women also have hair loss during or after pregnancy, or hair that feels dry or thin. Your hair will most likely return to normal after your baby is born. Your breasts will continue to grow and be tender. A yellow discharge may leak from your breasts called colostrum. Your belly button may stick out. You may   feel short of breath because of your expanding uterus. You may notice the fetus "dropping," or moving lower in your abdomen. You may have a bloody mucus discharge. This usually occurs a few days to a week before labor begins. Your cervix becomes thin and soft (effaced) near your due date. WHAT TO EXPECT AT YOUR PRENATAL EXAMS  You will have prenatal exams every 2 weeks until week 36. Then, you will have weekly prenatal exams. During a routine prenatal visit: You will be weighed to make sure you and the fetus are growing normally. Your blood pressure is taken. Your abdomen will be measured to track your baby's growth. The fetal heartbeat will be listened to. Any test results from the previous visit will be discussed. You may have a cervical check near your due date to see if you have effaced. At around 36 weeks, your caregiver will check your cervix. At the same time, your  caregiver will also perform a test on the secretions of the vaginal tissue. This test is to determine if a type of bacteria, Group B streptococcus, is present. Your caregiver will explain this further. Your caregiver may ask you: What your birth plan is. How you are feeling. If you are feeling the baby move. If you have had any abnormal symptoms, such as leaking fluid, bleeding, severe headaches, or abdominal cramping. If you have any questions. Other tests or screenings that may be performed during your third trimester include: Blood tests that check for low iron levels (anemia). Fetal testing to check the health, activity level, and growth of the fetus. Testing is done if you have certain medical conditions or if there are problems during the pregnancy. FALSE LABOR You may feel small, irregular contractions that eventually go away. These are called Braxton Hicks contractions, or false labor. Contractions may last for hours, days, or even weeks before true labor sets in. If contractions come at regular intervals, intensify, or become painful, it is best to be seen by your caregiver.  SIGNS OF LABOR  Menstrual-like cramps. Contractions that are 5 minutes apart or less. Contractions that start on the top of the uterus and spread down to the lower abdomen and back. A sense of increased pelvic pressure or back pain. A watery or bloody mucus discharge that comes from the vagina. If you have any of these signs before the 37th week of pregnancy, call your caregiver right away. You need to go to the hospital to get checked immediately. HOME CARE INSTRUCTIONS  Avoid all smoking, herbs, alcohol, and unprescribed drugs. These chemicals affect the formation and growth of the baby. Follow your caregiver's instructions regarding medicine use. There are medicines that are either safe or unsafe to take during pregnancy. Exercise only as directed by your caregiver. Experiencing uterine cramps is a good sign to  stop exercising. Continue to eat regular, healthy meals. Wear a good support bra for breast tenderness. Do not use hot tubs, steam rooms, or saunas. Wear your seat belt at all times when driving. Avoid raw meat, uncooked cheese, cat litter boxes, and soil used by cats. These carry germs that can cause birth defects in the baby. Take your prenatal vitamins. Try taking a stool softener (if your caregiver approves) if you develop constipation. Eat more high-fiber foods, such as fresh vegetables or fruit and whole grains. Drink plenty of fluids to keep your urine clear or pale yellow. Take warm sitz baths to soothe any pain or discomfort caused by hemorrhoids. Use hemorrhoid cream if   your caregiver approves. If you develop varicose veins, wear support hose. Elevate your feet for 15 minutes, 3-4 times a day. Limit salt in your diet. Avoid heavy lifting, wear low heal shoes, and practice good posture. Rest a lot with your legs elevated if you have leg cramps or low back pain. Visit your dentist if you have not gone during your pregnancy. Use a soft toothbrush to brush your teeth and be gentle when you floss. A sexual relationship may be continued unless your caregiver directs you otherwise. Do not travel far distances unless it is absolutely necessary and only with the approval of your caregiver. Take prenatal classes to understand, practice, and ask questions about the labor and delivery. Make a trial run to the hospital. Pack your hospital bag. Prepare the baby's nursery. Continue to go to all your prenatal visits as directed by your caregiver. SEEK MEDICAL CARE IF: You are unsure if you are in labor or if your water has broken. You have dizziness. You have mild pelvic cramps, pelvic pressure, or nagging pain in your abdominal area. You have persistent nausea, vomiting, or diarrhea. You have a bad smelling vaginal discharge. You have pain with urination. SEEK IMMEDIATE MEDICAL CARE IF:  You  have a fever. You are leaking fluid from your vagina. You have spotting or bleeding from your vagina. You have severe abdominal cramping or pain. You have rapid weight loss or gain. You have shortness of breath with chest pain. You notice sudden or extreme swelling of your face, hands, ankles, feet, or legs. You have not felt your baby move in over an hour. You have severe headaches that do not go away with medicine. You have vision changes. Document Released: 07/10/2001 Document Revised: 07/21/2013 Document Reviewed: 09/16/2012 ExitCare Patient Information 2015 [employer]. This information is not intended to replace advice given to you by your health care provider. Make sure you discuss any questions you have with your health care provider.       

## 2023-08-28 ENCOUNTER — Other Ambulatory Visit: Payer: Self-pay | Admitting: Advanced Practice Midwife

## 2023-08-28 DIAGNOSIS — N2889 Other specified disorders of kidney and ureter: Secondary | ICD-10-CM

## 2023-08-29 ENCOUNTER — Ambulatory Visit (INDEPENDENT_AMBULATORY_CARE_PROVIDER_SITE_OTHER): Payer: Medicaid Other | Admitting: Women's Health

## 2023-08-29 ENCOUNTER — Ambulatory Visit: Payer: Medicaid Other

## 2023-08-29 ENCOUNTER — Encounter: Payer: Self-pay | Admitting: Women's Health

## 2023-08-29 VITALS — BP 124/80 | HR 92 | Wt 216.0 lb

## 2023-08-29 DIAGNOSIS — Z3483 Encounter for supervision of other normal pregnancy, third trimester: Secondary | ICD-10-CM

## 2023-08-29 DIAGNOSIS — Z3A32 32 weeks gestation of pregnancy: Secondary | ICD-10-CM

## 2023-08-29 DIAGNOSIS — Z3480 Encounter for supervision of other normal pregnancy, unspecified trimester: Secondary | ICD-10-CM

## 2023-08-29 DIAGNOSIS — N2889 Other specified disorders of kidney and ureter: Secondary | ICD-10-CM | POA: Diagnosis not present

## 2023-08-29 DIAGNOSIS — G2581 Restless legs syndrome: Secondary | ICD-10-CM

## 2023-08-29 NOTE — Progress Notes (Signed)
[place] 32+2 wks,cephalic,posterior placenta gr 3,AFI 13 cm,mild left renal pelvic dilatation 6.6 mm,right renal pelvis 3 mm WNL,FHR 154 bpm,EFW 1960 g 42%

## 2023-08-29 NOTE — Progress Notes (Signed)
LOW-RISK PREGNANCY VISIT Patient name: Julie Case MRN [id number]  Date of birth: 1991/03/26 Chief Complaint:   Routine Prenatal Visit  History of Present Illness:   Julie Case is a 33 y.o. [id number] female at [redacted]w[redacted]d with an Estimated Date of Delivery: 10/22/23 being seen today for ongoing management of a low-risk pregnancy.   Today she reports  sciatica . Contractions: Irritability. Vag. Bleeding: None.  Movement: Present. denies leaking of fluid.     08/05/2023   10:49 AM 08/01/2023    8:50 AM 04/17/2023    2:01 PM 03/05/2023    2:16 PM 01/28/2023    2:40 PM  Depression screen PHQ 2/9  Decreased Interest 0 0 0 0 0  Down, Depressed, Hopeless 0 0 1 0 0  PHQ - 2 Score 0 0 1 0 0  Altered sleeping 0 1 1 0 0  Tired, decreased energy 0 0 1 0 2  Change in appetite 0 1 1 0 1  Feeling bad or failure about yourself  0 0 0 0 0  Trouble concentrating 0 0 0 0 0  Moving slowly or fidgety/restless 0 0 0 0 0  Suicidal thoughts 0 0 0 0 0  PHQ-9 Score 0 2 4 0 3  Difficult doing work/chores Not difficult at all   Not difficult at all Not difficult at all        08/05/2023   10:49 AM 04/17/2023    2:01 PM 03/05/2023    2:16 PM 01/28/2023    2:40 PM  GAD 7 : Generalized Anxiety Score  Nervous, Anxious, on Edge 1 0 0 0  Control/stop worrying 0 0 0 1  Worry too much - different things 0 0 0 0  Trouble relaxing 0 0 0 1  Restless 0 0 0 0  Easily annoyed or irritable 0 0 0 0  Afraid - awful might happen 0 0 0 0  Total GAD 7 Score 1 0 0 2  Anxiety Difficulty Not difficult at all  Not difficult at all Somewhat difficult      Review of Systems:   Pertinent items are noted in HPI Denies abnormal vaginal discharge w/ itching/odor/irritation, headaches, visual changes, shortness of breath, chest pain, abdominal pain, severe nausea/vomiting, or problems with urination or bowel movements unless otherwise stated above. Pertinent History Reviewed:  Reviewed past medical,surgical, social, obstetrical and family  history.  Reviewed problem list, medications and allergies. Physical Assessment:   Vitals:   08/29/23 1413  BP: 124/80  Pulse: 92  Weight: 216 lb (98 kg)  Body mass index is 35.94 kg/m.        Physical Examination:   General appearance: Well appearing, and in no distress  Mental status: Alert, oriented to person, place, and time  Skin: Warm & dry  Cardiovascular: Normal heart rate noted  Respiratory: Normal respiratory effort, no distress  Abdomen: Soft, gravid, nontender  Pelvic: Cervical exam deferred         Extremities: Edema: None  Fetal Status:     Movement: Present  [place] 32+2 wks,cephalic,posterior placenta gr 3,AFI 13 cm,mild left renal pelvic dilatation 6.6 mm,right renal pelvis 3 mm WNL,FHR 154 bpm,EFW 1960 g 42%   Chaperone: N/A   No results found for this or any previous visit (from the past 24 hours).  Assessment & Plan:  1) Low-risk pregnancy [id number] at [redacted]w[redacted]d with an Estimated Date of Delivery: 10/22/23   2) Sciatica, gave printed exercises  3) Fetal mild Lt RPD> 6.28mm, no  further f/u during pregnancy   Meds: No orders of the defined types were placed in this encounter.  Labs/procedures today: U/S and declines RSV vaccine today, maybe next visit  Plan:  Continue routine obstetrical care  Next visit: prefers in person    Reviewed: Preterm labor symptoms and general obstetric precautions including but not limited to vaginal bleeding, contractions, leaking of fluid and fetal movement were reviewed in detail with the patient.  All questions were answered. Does have home bp cuff. Office bp cuff given: not applicable. Check bp weekly, let us know if consistently >140 and/or >90.  Follow-up: Return in about 2 weeks (around 09/12/2023) for LROB, CNM, in person.  Future Appointments  Date Time Provider Department Center  09/12/2023  1:30 PM Cresenzo-Dishmon, Scarlette Calico, CNM [facility]    No orders of the defined types were placed in this encounter.  Cheral Marker CNM, [facility] 08/29/2023 2:53 PM

## 2023-08-29 NOTE — Patient Instructions (Addendum)
Julie Case, thank you for choosing our office today! We appreciate the opportunity to meet your healthcare needs. You may receive a short survey by mail, e-mail, or through [employer]. If you are happy with your care we would appreciate if you could take just a few minutes to complete the survey questions. We read all of your comments and take your feedback very seriously. Thank you again for choosing our office.  Center for [employer] Team at [facility]  [facility] at [facility] ([address] [place], [place] [address]) Entrance C, located off of [address] Free 24/7 valet parking   CLASSES: Go to [email or web address] to register for classes (childbirth, breastfeeding, waterbirth, infant CPR, daddy bootcamp, etc.)  Call the office [phone]) or go to [facility] if: You begin to have strong, frequent contractions Your water breaks.  Sometimes it is a big gush of fluid, sometimes it is just a trickle that keeps getting your panties wet or running down your legs You have vaginal bleeding.  It is normal to have a small amount of spotting if your cervix was checked.  You don't feel your baby moving like normal.  If you don't, get you something to eat and drink and lay down and focus on feeling your baby move.   If your baby is still not moving like normal, you should call the office or go to [facility].  Call the office [phone]) or go to [facility] hospital for these signs of pre-eclampsia: Severe headache that does not go away with Tylenol Visual changes- seeing spots, double, blurred vision Pain under your right breast or upper abdomen that does not go away with Tums or heartburn medicine Nausea and/or vomiting Severe swelling in your hands, feet, and face   Tdap Vaccine It is recommended that you get the Tdap vaccine during the third trimester of EACH pregnancy to help protect your baby from getting pertussis (whooping cough) 27-36 weeks is the BEST time to do  this so that you can pass the protection on to your baby. During pregnancy is better than after pregnancy, but if you are unable to get it during pregnancy it will be offered at the hospital.  You can get this vaccine with [place], at the health department, your family doctor, or some local pharmacies Everyone who will be around your baby should also be up-to-date on their vaccines before the baby comes. Adults (who are not pregnant) only need 1 dose of Tdap during adulthood.   [facility] Pediatricians/Family Doctors [facility]): [address] [place], [phone]           [facility]: [address], [phone]                [facility]): [address], [phone] (call to ask if accepting patients) [facility] Department: [address], [place], [phone]    [facility] Pediatricians/Family Doctors [facility]): [phone] [address], [phone] [facility]: [address] [place], [phone] [facility]: [address], [phone]  [facility] Doctors  [facility]): [phone] [facility]: [address] [phone]   [facility] Doctors [facility]: [address] [phone]  [facility] Family Doctors  [facility]: [phone], [phone]  Home Blood Pressure Monitoring for Patients   Your provider has recommended that you check your  blood pressure (BP) at least once a week at home. If you do not have a blood pressure cuff at home, one will be provided for you. Contact your provider if you have not received your monitor within 1 week.   Helpful Tips for Accurate Home Blood Pressure Checks  Don't smoke, exercise, or drink caffeine 30 minutes before checking your BP Use the restroom before checking your BP (a full bladder can raise your  pressure) Relax in a comfortable upright chair Feet on the ground Left arm resting comfortably on a flat surface at the level of your heart Legs uncrossed Back supported Sit quietly and don't talk Place the cuff on your bare arm Adjust snuggly, so that only two fingertips can fit between your skin and the top of the cuff Check 2 readings separated by at least one minute Keep a log of your BP readings For a visual, please reference this diagram: http://ccnc.care/bpdiagram  Provider Name: [facility]     Phone: [phone]  Zone 1: ALL CLEAR  Continue to monitor your symptoms:  BP reading is less than 140 (top number) or less than 90 (bottom number)  No right upper stomach pain No headaches or seeing spots No feeling nauseated or throwing up No swelling in face and hands  Zone 2: CAUTION Call your doctor's office for any of the following:  BP reading is greater than 140 (top number) or greater than 90 (bottom number)  Stomach pain under your ribs in the middle or right side Headaches or seeing spots Feeling nauseated or throwing up Swelling in face and hands  Zone 3: EMERGENCY  Seek immediate medical care if you have any of the following:  BP reading is greater than160 (top number) or greater than 110 (bottom number) Severe headaches not improving with Tylenol Serious difficulty catching your breath Any worsening symptoms from Zone 2  Preterm Labor and Birth Information  The normal length of a pregnancy is 39-41 weeks. Preterm labor is when labor starts before 37 completed weeks of pregnancy. What are the risk factors for preterm labor? Preterm labor is more likely to occur in women who: Have certain infections during pregnancy such as a bladder infection, sexually transmitted infection, or infection inside the uterus (chorioamnionitis). Have a shorter-than-normal cervix. Have gone into preterm labor before. Have had surgery on their cervix. Are younger than age 24  or older than age 83. Are African American. Are pregnant with twins or multiple babies (multiple gestation). Take street drugs or smoke while pregnant. Do not gain enough weight while pregnant. Became pregnant shortly after having been pregnant. What are the symptoms of preterm labor? Symptoms of preterm labor include: Cramps similar to those that can happen during a menstrual period. The cramps may happen with diarrhea. Pain in the abdomen or lower back. Regular uterine contractions that may feel like tightening of the abdomen. A feeling of increased pressure in the pelvis. Increased watery or bloody mucus discharge from the vagina. Water breaking (ruptured amniotic sac). Why is it important to recognize signs of preterm labor? It is important to recognize signs of preterm labor because babies who are born prematurely may not be fully developed. This can put them at an increased risk for: Long-term (chronic) heart and lung problems. Difficulty immediately after birth with regulating body systems, including blood sugar, body temperature, heart rate, and breathing rate. Bleeding in the brain. Cerebral palsy. Learning difficulties. Death. These risks are highest for babies who are born before 34 weeks  of pregnancy. How is preterm labor treated? Treatment depends on the length of your pregnancy, your condition, and the health of your baby. It may involve: Having a stitch (suture) placed in your cervix to prevent your cervix from opening too early (cerclage). Taking or being given medicines, such as: Hormone medicines. These may be given early in pregnancy to help support the pregnancy. Medicine to stop contractions. Medicines to help mature the baby's lungs. These may be prescribed if the risk of delivery is high. Medicines to prevent your baby from developing cerebral palsy. If the labor happens before 34 weeks of pregnancy, you may need to stay in the hospital. What should I do if I  think I am in preterm labor? If you think that you are going into preterm labor, call your health care provider right away. How can I prevent preterm labor in future pregnancies? To increase your chance of having a full-term pregnancy: Do not use any tobacco products, such as cigarettes, chewing tobacco, and e-cigarettes. If you need help quitting, ask your health care provider. Do not use street drugs or medicines that have not been prescribed to you during your pregnancy. Talk with your health care provider before taking any herbal supplements, even if you have been taking them regularly. Make sure you gain a healthy amount of weight during your pregnancy. Watch for infection. If you think that you might have an infection, get it checked right away. Make sure to tell your health care provider if you have gone into preterm labor before. This information is not intended to replace advice given to you by your health care provider. Make sure you discuss any questions you have with your health care provider. Document Revised: 11/07/2018 Document Reviewed: 12/07/2015 Elsevier Patient Education  2020 [employer] Inc. Sciatica Rehab Ask your health care provider which exercises are safe for you. Do exercises exactly as told by your health care provider and adjust them as directed. It is normal to feel mild stretching, pulling, tightness, or discomfort as you do these exercises. Stop right away if you feel sudden pain or your pain gets worse. Do not begin these exercises until told by your health care provider. Stretching and range-of-motion exercises These exercises warm up your muscles and joints and improve the movement and flexibility of your hips and back. These exercises also help to relieve pain, numbness, and tingling. Sciatic nerve glide  Sit in a chair with your head facing down toward your chest. Place your hands behind your back. Let your shoulders slump forward. Slowly straighten one of your  legs while you tilt your head back as if you are looking toward the ceiling. Only straighten your leg as far as you can without making your symptoms worse. Hold this position for __________ seconds. Slowly return your leg and head back to the starting position. Repeat with your other leg. Repeat __________ times. Complete this exercise __________ times a day. Knee to chest with hip adduction and internal rotation  Lie on your back on a firm surface with both legs straight. Bend one of your knees and move it up toward your chest until you feel a gentle stretch in your lower back and buttock. Then, move your knee toward the shoulder that is on the opposite side from your leg. This is hip adduction and internal rotation. Hold your leg in this position by holding on to the front of your knee. Hold this position for __________ seconds. Slowly return to the starting position. Repeat with  your other leg. Repeat __________ times. Complete this exercise __________ times a day. Prone extension on elbows  Lie on your abdomen on a firm surface. A bed may be too soft for this exercise. Prop yourself up on your elbows. Use your arms to help lift your chest up until you feel a gentle stretch in your abdomen and your lower back. This will place some of your body weight on your elbows. If this is uncomfortable, try stacking pillows under your chest. Your hips should stay down, against the surface that you are lying on. Keep your hip and back muscles relaxed. Hold this position for __________ seconds. Slowly relax your upper body and return to the starting position. Repeat __________ times. Complete this exercise __________ times a day. Strengthening exercises These exercises build strength and endurance in your back. Endurance is the ability to use your muscles for a long time, even after they get tired. Pelvic tilt This exercise strengthens the muscles that lie deep in the abdomen. Lie on your back on a  firm surface. Bend your knees and keep your feet flat on the surface. Tense your abdominal muscles. Tip your pelvis up toward the ceiling and flatten your lower back into the firm surface. To help with this exercise, you may place a small towel under your lower back and try to push your back into the towel. Hold this position for __________ seconds. Let your muscles relax completely before you repeat this exercise. Repeat __________ times. Complete this exercise __________ times a day. Alternating arm and leg raises  Get on your hands and knees on a firm surface. If you are on a hard floor, you may want to use padding, such as an exercise mat, to cushion your knees. Line up your arms and legs. Your hands should be directly below your shoulders, and your knees should be directly below your hips. Lift your left leg behind you. At the same time, raise your right arm and straighten it in front of you. Do not lift your leg higher than your hip. Do not lift your arm higher than your shoulder. Keep your abdominal and back muscles tight. Keep your hips facing the ground. Do not arch your back. Keep your balance carefully, and do not hold your breath. Hold this position for __________ seconds. Slowly return to the starting position. Repeat with your right leg and your left arm. Repeat __________ times. Complete this exercise __________ times a day. Posture and body mechanics Good posture and healthy body mechanics can help to relieve stress in your body's tissues and joints. Body mechanics refers to the movements and positions of your body while you do your daily activities. Posture is part of body mechanics. Good posture means: Your spine is in its natural S-curve position (neutral). Your shoulders are pulled back slightly. Your head is not tipped forward. Follow these guidelines to improve your posture and body mechanics in your everyday activities. Standing  When standing, keep your spine  neutral and your feet about hip width apart. Keep a slight bend in your knees. Your ears, shoulders, and hips should line up. When you do a task in which you stand in one place for a long time, place one foot up on a stable object that is 2-4 inches (5-10 cm) high, such as a footstool. This helps keep your spine neutral. Sitting  When sitting, keep your spine neutral and keep your feet flat on the floor. Use a footrest, if necessary, and keep your thighs  parallel to the floor. Avoid rounding your shoulders, and avoid tilting your head forward. When working at a desk or a computer, keep your desk at a height where your hands are slightly lower than your elbows. Slide your chair under your desk so you are close enough to maintain good posture. When working at a computer, place your monitor at a height where you are looking straight ahead and you do not have to tilt your head forward or downward to look at the screen. Resting  When lying down and resting, avoid positions that are most painful for you. If you have pain with activities such as sitting, bending, stooping, or squatting, lie in a position in which your body does not bend very much. For example, avoid curling up on your side with your arms and knees near your chest (fetal position). If you have pain with activities such as standing for a long time or reaching with your arms, lie with your spine in a neutral position and bend your knees slightly. Try the following positions: Lying on your side with a pillow between your knees. Lying on your back with a pillow under your knees. Lifting  When lifting objects, keep your feet at least shoulder width apart and tighten your abdominal muscles. Bend your knees and hips and keep your spine neutral. It is important to lift using the strength of your legs, not your back. Do not lock your knees straight out. Always ask for help to lift heavy or awkward objects. This information is not intended to  replace advice given to you by your health care provider. Make sure you discuss any questions you have with your health care provider. Document Revised: 10/24/2021 Document Reviewed: 10/24/2021 Elsevier Patient Education  2024 [employer].

## 2023-08-31 ENCOUNTER — Encounter (HOSPITAL_COMMUNITY): Payer: Self-pay | Admitting: *Deleted

## 2023-08-31 ENCOUNTER — Emergency Department (HOSPITAL_COMMUNITY)
Admission: EM | Admit: 2023-08-31 | Discharge: 2023-08-31 | Disposition: A | Discharge status: Home / Self Care | Payer: Medicaid Other | Attending: Emergency Medicine | Admitting: Emergency Medicine

## 2023-08-31 ENCOUNTER — Other Ambulatory Visit: Payer: Self-pay

## 2023-08-31 DIAGNOSIS — O99513 Diseases of the respiratory system complicating pregnancy, third trimester: Secondary | ICD-10-CM | POA: Diagnosis not present

## 2023-08-31 DIAGNOSIS — Z20822 Contact with and (suspected) exposure to covid-19: Secondary | ICD-10-CM | POA: Diagnosis not present

## 2023-08-31 DIAGNOSIS — Z3A32 32 weeks gestation of pregnancy: Secondary | ICD-10-CM | POA: Diagnosis not present

## 2023-08-31 DIAGNOSIS — O98513 Other viral diseases complicating pregnancy, third trimester: Secondary | ICD-10-CM | POA: Insufficient documentation

## 2023-08-31 DIAGNOSIS — J101 Influenza due to other identified influenza virus with other respiratory manifestations: Secondary | ICD-10-CM | POA: Insufficient documentation

## 2023-08-31 DIAGNOSIS — O26893 Other specified pregnancy related conditions, third trimester: Secondary | ICD-10-CM | POA: Diagnosis present

## 2023-08-31 LAB — RESP PANEL BY RT-PCR (RSV, FLU A&B, COVID)  RVPGX2
Influenza A by PCR: POSITIVE — AB
Influenza B by PCR: NEGATIVE
Resp Syncytial Virus by PCR: NEGATIVE
SARS Coronavirus 2 by RT PCR: NEGATIVE

## 2023-08-31 MED ORDER — ACETAMINOPHEN 500 MG PO TABS
1000.0000 mg | ORAL_TABLET | Freq: Once | ORAL | Status: AC
  Administered 2023-08-31: 1000 mg via ORAL
  Filled 2023-08-31: qty 2

## 2023-08-31 MED ORDER — ALBUTEROL SULFATE HFA 108 (90 BASE) MCG/ACT IN AERS
2.0000 | INHALATION_SPRAY | Freq: Once | RESPIRATORY_TRACT | Status: AC
  Administered 2023-08-31: 2 via RESPIRATORY_TRACT
  Filled 2023-08-31: qty 6.7

## 2023-08-31 NOTE — ED Provider Notes (Signed)
[place] EMERGENCY DEPARTMENT AT [facility] Provider Note   CSN: [id number] Arrival date & time: 08/31/23  1236     History  Chief Complaint  Patient presents with   Fever    Julie Case is a 33 y.o. female [id number] 32 weeks 4 days gestation with history of, depression, epilepsy, who presents the emergency department complaining of fever, cough and bodyaches.  Has had 1 or 2 episodes of posttussive emesis, no nausea.  Some diarrhea.  No abdominal pain or vaginal bleeding.  She has been taking Tylenol and Robitussin.   Fever Associated symptoms: congestion, cough, diarrhea, myalgias and vomiting   Associated symptoms: no nausea        Home Medications Prior to Admission medications   Medication Sig Start Date End Date Taking? Authorizing Provider  Acetaminophen (TYLENOL PO) Take by mouth.    [provider]  Blood Pressure Monitor MISC For regular home bp monitoring during pregnancy 04/17/23   Arabella Merles, CNM  diphenhydrAMINE (BENADRYL) 25 mg capsule Take 25 mg by mouth every 6 (six) hours as needed.    [provider]  fluticasone (FLONASE) 50 MCG/ACT nasal spray Place 2 sprays into both nostrils daily. Patient not taking: Reported on 08/29/2023 10/19/22   Gilmore Laroche, FNP  Magnesium Glycinate 120 MG CAPS Take 3 tablets by mouth daily. 06/05/23   Arabella Merles, CNM  Prenatal Vit-Fe Fumarate-FA (PRENATAL VITAMIN PO) Take by mouth.    [provider]      Allergies    Penicillins and Naproxen    Review of Systems   Review of Systems  Constitutional:  Positive for fever.  HENT:  Positive for congestion.   Respiratory:  Positive for cough.   Gastrointestinal:  Positive for diarrhea and vomiting. Negative for abdominal pain and nausea.  Genitourinary:  Negative for vaginal bleeding.  Musculoskeletal:  Positive for myalgias.  All other systems reviewed and are negative.   Physical Exam Updated Vital Signs BP 126/80 (BP  Location: Right Arm)   Pulse 99   Temp 98.7 F (37.1 C) (Oral)   Resp 18   Ht 5\' 6"  (1.676 m)   Wt 98.9 kg   LMP 01/15/2023 (Exact Date)   SpO2 98%   BMI 35.19 kg/m  Physical Exam Vitals and nursing note reviewed.  Constitutional:      Appearance: Normal appearance.  HENT:     Head: Normocephalic and atraumatic.  Eyes:     Conjunctiva/sclera: Conjunctivae normal.  Cardiovascular:     Rate and Rhythm: Normal rate and regular rhythm.  Pulmonary:     Effort: Pulmonary effort is normal. No respiratory distress.     Breath sounds: Wheezing present.     Comments: Mild expiratory wheezing, coughing Abdominal:     General: There is no distension.     Palpations: Abdomen is soft.     Tenderness: There is no abdominal tenderness.     Comments: Gravid uterus  Skin:    General: Skin is warm and dry.  Neurological:     General: No focal deficit present.     Mental Status: She is alert.     ED Results / Procedures / Treatments   Labs (all labs ordered are listed, but only abnormal results are displayed) Labs Reviewed  RESP PANEL BY RT-PCR (RSV, FLU A&B, COVID)  [id number] - Abnormal; Notable for the following components:      Result Value   Influenza A by PCR POSITIVE (*)  All other components within normal limits    EKG None  Radiology No results found.  Procedures Procedures    Medications Ordered in ED Medications  albuterol (VENTOLIN HFA) 108 (90 Base) MCG/ACT inhaler 2 puff (has no administration in time range)  acetaminophen (TYLENOL) tablet 1,000 mg (1,000 mg Oral Given 08/31/23 1555)    ED Course/ Medical Decision Making/ A&P                                 Medical Decision Making Risk OTC drugs. Prescription drug management.  This patient is a 33 y.o. female who presents to the ED for concern of fever, body aches, cough. [id number], 32w 4d gestation. Receives normal prenatal care.   Differential diagnoses prior to evaluation: The emergent differential  diagnosis includes, but is not limited to,  upper respiratory infection, lower respiratory infection, allergies, asthma, irritants, sinus/esophageal foreign body, medications, reflux, interstitial lung disease, postnasal drip, viral illness, sepsis. This is not an exhaustive differential.   Past Medical History / Co-morbidities / Additional history: Chart reviewed. Pertinent results include: depression, epilepsy, sciatica Reviewed routine prenatal visit note from 1/30.   Physical Exam: Physical exam performed. The pertinent findings include: Normal vital signs, no acute distress.  Mild expiratory wheezing on lung exam with coughing.  Gravid uterus to abdominal exam, no tenderness.  Lab Tests/Imaging studies: I personally interpreted labs/imaging and the pertinent results include:  respiratory panel positive for influenza A.   Medications: I ordered medication including albuterol inhaler.  I have reviewed the patients home medicines and have made adjustments as needed.   Disposition: After consideration of the diagnostic results and the patients response to treatment, I feel that emergency department workup does not suggest an emergent condition requiring admission or immediate intervention beyond what has been performed at this time. Patient with symptoms consistent with influenza.  Vitals are stable, low-grade fever.  No signs of dehydration, tolerating PO's.  Lungs are clear.   The plan is: Patient will be discharged with instructions to orally hydrate, rest, and use over-the-counter medications such as Tylenol for fever.  The patient is safe for discharge and has been instructed to return immediately for worsening symptoms, change in symptoms or any other concerns.  Final Clinical Impression(s) / ED Diagnoses Final diagnoses:  Influenza A  [redacted] weeks gestation of pregnancy    Rx / DC Orders ED Discharge Orders     None      Portions of this report may have been transcribed using  voice recognition software. Every effort was made to ensure accuracy; however, inadvertent computerized transcription errors may be present.    Jeanella Flattery 08/31/23 1604    Eber Hong, MD 09/01/23 [phone]

## 2023-08-31 NOTE — ED Triage Notes (Signed)
Pt with c/o body aches, fever and cough since Thursday.  + loss of appetite Pt states she is [redacted] weeks pregnant. Pt denies any issues with pregnancy

## 2023-08-31 NOTE — Discharge Instructions (Signed)
You were seen in the emergency department today for fever and body aches.  As we discussed your influenza A test is positive.  This is a viral illness very common at this time of year, and we normally treat with over-the-counter medications.  Symptoms can last for up to a week.  Make sure that you are drinking lots of fluids and getting plenty of rest. You can take decongestants as long as you take them with lots of water. You can use lozenges or chloraseptic spray as needed for sore throat.   Please use Tylenol for pain or fever.  You may use 1000 mg of Tylenol every 6 hours.  Do not exceed 4000 g of Tylenol within 24 hours.    Continue to monitor how you are doing, and return to the emergency department for new or worsening symptoms such as chest pain, difficulty breathing not related to coughing, fever despite medication, or persistent vomiting or diarrhea. At that time I would recommend going to [facility] hospital at [facility]. Also, please make your OBGYN aware of your symptoms so you can follow up with them once feeling better.

## 2023-09-02 ENCOUNTER — Telehealth: Payer: Self-pay | Admitting: *Deleted

## 2023-09-02 ENCOUNTER — Other Ambulatory Visit: Payer: Self-pay | Admitting: Obstetrics & Gynecology

## 2023-09-02 MED ORDER — ONDANSETRON 4 MG PO TBDP: 4.0000 mg | ORAL_TABLET | Freq: Three times a day (TID) | ORAL | 0 refills | Status: DC | PRN

## 2023-09-02 NOTE — Telephone Encounter (Signed)
Pt diagnosed with flu at ER yesterday. Advised home care. Pt also requesting rx for nausea meds due to nausea and vomiting. Will route note to Dr. Charlotta Newton.

## 2023-09-02 NOTE — Progress Notes (Signed)
Rx sent in for nausea medication

## 2023-09-12 ENCOUNTER — Ambulatory Visit (INDEPENDENT_AMBULATORY_CARE_PROVIDER_SITE_OTHER): Payer: Medicaid Other | Admitting: Advanced Practice Midwife

## 2023-09-12 VITALS — BP 121/81 | HR 79 | Wt 215.0 lb

## 2023-09-12 DIAGNOSIS — E669 Obesity, unspecified: Secondary | ICD-10-CM | POA: Diagnosis not present

## 2023-09-12 DIAGNOSIS — Z3483 Encounter for supervision of other normal pregnancy, third trimester: Secondary | ICD-10-CM

## 2023-09-12 DIAGNOSIS — Z3A34 34 weeks gestation of pregnancy: Secondary | ICD-10-CM

## 2023-09-12 DIAGNOSIS — Z3480 Encounter for supervision of other normal pregnancy, unspecified trimester: Secondary | ICD-10-CM | POA: Diagnosis not present

## 2023-09-12 NOTE — Progress Notes (Signed)
   LOW-RISK PREGNANCY VISIT Patient name: Julie Case MRN [id number]  Date of birth: 1990/12/15 Chief Complaint:   Routine Prenatal Visit  History of Present Illness:   Julie Case is a 33 y.o. [id number] female at [redacted]w[redacted]d with an Estimated Date of Delivery: 10/22/23 being seen today for ongoing management of a low-risk pregnancy.  Today she reports normal pregnancy complaints . Contractions: Irritability. Vag. Bleeding: None.   . denies leaking of fluid. Review of Systems:   Pertinent items are noted in HPI Denies abnormal vaginal discharge w/ itching/odor/irritation, headaches, visual changes, shortness of breath, chest pain, abdominal pain, severe nausea/vomiting, or problems with urination or bowel movements unless otherwise stated above. Pertinent History Reviewed:  Reviewed past medical,surgical, social, obstetrical and family history.  Reviewed problem list, medications and allergies. Physical Assessment:   Vitals:   09/12/23 1337 09/12/23 1338  BP: 121/81 121/81  Pulse: 79 79  Weight: 215 lb (97.5 kg)   Body mass index is 34.7 kg/m.        Physical Examination:   General appearance: Well appearing, and in no distress  Mental status: Alert, oriented to person, place, and time  Skin: Warm & dry  Cardiovascular: Normal heart rate noted  Respiratory: Normal respiratory effort, no distress  Abdomen: Soft, gravid, nontender  Pelvic: Cervical exam deferred         Extremities: Edema: None Chaperone:  N/A   Fetal Status: Fetal Heart Rate (bpm): 148 Fundal Height: 34 cm        No results found for this or any previous visit (from the past 24 hours).  Assessment & Plan:    Pregnancy: [id number] at [redacted]w[redacted]d 1. Obesity (BMI 35.0-39.9 without comorbidity) (Primary)   2. [redacted] weeks gestation of pregnancy   3. Supervision of normal intrauterine pregnancy in multigravida, third trimester   4. Supervision of other normal pregnancy, antepartum      Meds: No orders of the defined types  were placed in this encounter.  Labs/procedures today: none  Plan:  Continue routine obstetrical care  Next visit: prefers in person    Reviewed: Preterm labor symptoms and general obstetric precautions including but not limited to vaginal bleeding, contractions, leaking of fluid and fetal movement were reviewed in detail with the patient.  All questions were answered. Has home bp cuff.. Check bp weekly, let us know if >140/90.   Follow-up: Return in about 2 weeks (around 09/26/2023) for LROB.  Future Appointments  Date Time Provider Department Center  09/26/2023  1:30 PM Cresenzo-Dishmon, Scarlette Calico, CNM [facility]    No orders of the defined types were placed in this encounter.  Jacklyn Shell DNP, CNM 09/12/2023 2:50 PM

## 2023-09-12 NOTE — Patient Instructions (Addendum)
AM I IN LABOR? What is labor? Labor is the work that your body does to birth your baby. Your uterus (the womb) contracts. Your cervix (the mouth of the uterus) opens. You will push your baby out into the world.  What do contractions (labor pains) feel like? When they first start, contractions usually feel like cramps during your period. Sometimes you feel pain in your back. Most often, contractions feel like muscles pulling painfully in your lower belly. At first, the contractions will probably be 15 to 20 minutes apart. They will not feel too painful. As labor goes on, the contractions get stronger, closer together, and more painful.  How do I time the contractions? Time your contractions by counting the number of minutes from the start of one contraction to the start of the next contraction.  What should I do when the contractions start? If it is night and you can sleep, sleep. If it happens during the day, here are some things you can do to take care of yourself at home: ? Walk. If the pains you are having are real labor, walking will make the contractions come faster and harder. If the contractions are not going to continue and be real labor, walking will make the contractions slow down. ? Take a shower or bath. This will help you relax. ? Eat. Labor is a big event. It takes a lot of energy. ? Drink water. Not drinking enough water can cause false labor (contractions that hurt but do not open your cervix). If this is true labor, drinking water will help you have strength to get through your labor. ? Take a nap. Get all the rest you can. ? Get a massage. If your labor is in your back, a strong massage on your lower back may feel very good. Getting a foot massage is always good. ? Don't panic. You can do this. Your body was made for this. You are strong!  When should I go to the hospital or call my health care provider? ? Your contractions have been 5 minutes apart or less for at least 1  hour. ? If several contractions are so painful you cannot walk or talk during one. ? Your bag of waters breaks. (You may have a big gush of water or just water that runs down your legs when you walk.)  Are there other reasons to call my health care provider? Yes, you should call your health care provider or go to the hospital if you start to bleed like you are having a period-- blood that soaks your underwear or runs down your legs, if you have sudden severe pain, if your baby has not moved for several hours, or if you are leaking green fluid. The rule is as follows: If you are very concerned about something, call.    [employer] taping for pregnancy:  Youtube has good vidoes of "how tos" for lower back, pelvic, hip pain; swelling of feet, etc

## 2023-09-26 ENCOUNTER — Other Ambulatory Visit (HOSPITAL_COMMUNITY)
Admission: RE | Admit: 2023-09-26 | Discharge: 2023-09-26 | Disposition: A | Discharge status: Home / Self Care | Payer: Medicaid Other | Source: Ambulatory Visit | Attending: Advanced Practice Midwife | Admitting: Advanced Practice Midwife

## 2023-09-26 ENCOUNTER — Ambulatory Visit (INDEPENDENT_AMBULATORY_CARE_PROVIDER_SITE_OTHER): Payer: Medicaid Other | Admitting: Advanced Practice Midwife

## 2023-09-26 ENCOUNTER — Encounter: Payer: Self-pay | Admitting: Advanced Practice Midwife

## 2023-09-26 VITALS — BP 130/84 | HR 83 | Wt 221.0 lb

## 2023-09-26 DIAGNOSIS — Z3483 Encounter for supervision of other normal pregnancy, third trimester: Secondary | ICD-10-CM | POA: Insufficient documentation

## 2023-09-26 DIAGNOSIS — Z3480 Encounter for supervision of other normal pregnancy, unspecified trimester: Secondary | ICD-10-CM

## 2023-09-26 DIAGNOSIS — Z3A36 36 weeks gestation of pregnancy: Secondary | ICD-10-CM | POA: Diagnosis not present

## 2023-09-26 DIAGNOSIS — Z3493 Encounter for supervision of normal pregnancy, unspecified, third trimester: Secondary | ICD-10-CM | POA: Diagnosis not present

## 2023-09-26 NOTE — Patient Instructions (Signed)

## 2023-09-26 NOTE — Progress Notes (Signed)
   LOW-RISK PREGNANCY VISIT Patient name: Julie Case MRN [id number]  Date of birth: [birth year] Chief Complaint:   Routine Prenatal Visit (culture)  History of Present Illness:   Julie Case is a 33 y.o. [id number] female at [redacted]w[redacted]d with an Estimated Date of Delivery: 10/22/23 being seen today for ongoing management of a low-risk pregnancy.  Today she reports no complaints. Contractions: Regular.  .  Movement: Present. denies leaking of fluid. Review of Systems:   Pertinent items are noted in HPI Denies abnormal vaginal discharge w/ itching/odor/irritation, headaches, visual changes, shortness of breath, chest pain, abdominal pain, severe nausea/vomiting, or problems with urination or bowel movements unless otherwise stated above. Pertinent History Reviewed:  Reviewed past medical,surgical, social, obstetrical and family history.  Reviewed problem list, medications and allergies. Physical Assessment:   Vitals:   09/26/23 1329  BP: 130/84  Pulse: 83  Weight: 221 lb (100.2 kg)  Body mass index is 35.67 kg/m.        Physical Examination:   General appearance: Well appearing, and in no distress  Mental status: Alert, oriented to person, place, and time  Skin: Warm & dry  Cardiovascular: Normal heart rate noted  Respiratory: Normal respiratory effort, no distress  Abdomen: Soft, gravid, nontender  Pelvic: Cervical exam performed  Dilation: 3 Effacement (%): Thick Station: -2  Extremities: Edema: None Chaperone:  Peggy Dones   Fetal Status: Fetal Heart Rate (bpm): 150 Fundal Height: 36 cm Movement: Present Presentation: Vertex    No results found for this or any previous visit (from the past 24 hours).  Assessment & Plan:    Pregnancy: [id number] at [redacted]w[redacted]d 1. Supervision of other normal pregnancy, antepartum (Primary)   2. [redacted] weeks gestation of pregnancy  - Strep Gp B NAA+Rflx - Cervicovaginal ancillary only( [place])     Meds: No orders of the defined types were placed in this  encounter.  Labs/procedures today: GBS, GC, CHL  Plan:  Continue routine obstetrical care  Next visit: prefers in person    Reviewed: Term labor symptoms and general obstetric precautions including but not limited to vaginal bleeding, contractions, leaking of fluid and fetal movement were reviewed in detail with the patient.  All questions were answered. Has home bp cuff.. Check bp weekly, let us know if >140/90.   Follow-up: No follow-ups on file.  Future Appointments  Date Time Provider Department Center  10/03/2023  2:10 PM Jacklyn Shell, [place] [facility]  10/10/2023  2:50 PM Cheral Marker, CNM [facility]  10/17/2023  2:50 PM Cheral Marker, CNM [facility]    Orders Placed This Encounter  Procedures   Strep Gp B NAA+Rflx   Jacklyn Shell DNP, CNM 09/26/2023 2:15 PM

## 2023-09-28 LAB — STREP GP B NAA+RFLX: Strep Gp B NAA+Rflx: NEGATIVE

## 2023-09-29 ENCOUNTER — Encounter (HOSPITAL_COMMUNITY): Payer: Self-pay | Admitting: Family Medicine

## 2023-09-29 ENCOUNTER — Inpatient Hospital Stay (HOSPITAL_COMMUNITY)
Admission: AD | Admit: 2023-09-29 | Discharge: 2023-09-29 | Disposition: A | Discharge status: Home / Self Care | Payer: Medicaid Other | Attending: Family Medicine | Admitting: Family Medicine

## 2023-09-29 DIAGNOSIS — M25471 Effusion, right ankle: Secondary | ICD-10-CM | POA: Diagnosis not present

## 2023-09-29 DIAGNOSIS — Z3A36 36 weeks gestation of pregnancy: Secondary | ICD-10-CM

## 2023-09-29 DIAGNOSIS — M25472 Effusion, left ankle: Secondary | ICD-10-CM | POA: Diagnosis not present

## 2023-09-29 DIAGNOSIS — O26893 Other specified pregnancy related conditions, third trimester: Secondary | ICD-10-CM

## 2023-09-29 DIAGNOSIS — G2581 Restless legs syndrome: Secondary | ICD-10-CM

## 2023-09-29 DIAGNOSIS — R519 Headache, unspecified: Secondary | ICD-10-CM | POA: Insufficient documentation

## 2023-09-29 DIAGNOSIS — O26813 Pregnancy related exhaustion and fatigue, third trimester: Secondary | ICD-10-CM

## 2023-09-29 DIAGNOSIS — I10 Essential (primary) hypertension: Secondary | ICD-10-CM | POA: Diagnosis not present

## 2023-09-29 DIAGNOSIS — R030 Elevated blood-pressure reading, without diagnosis of hypertension: Secondary | ICD-10-CM | POA: Diagnosis not present

## 2023-09-29 DIAGNOSIS — O26899 Other specified pregnancy related conditions, unspecified trimester: Secondary | ICD-10-CM | POA: Diagnosis not present

## 2023-09-29 LAB — CBC
HCT: 32.4 % — ABNORMAL LOW (ref 36.0–46.0)
Hemoglobin: 11.3 g/dL — ABNORMAL LOW (ref 12.0–15.0)
MCH: 29.9 pg (ref 26.0–34.0)
MCHC: 34.9 g/dL (ref 30.0–36.0)
MCV: 85.7 fL (ref 80.0–100.0)
Platelets: 167 10*3/uL (ref 150–400)
RBC: 3.78 MIL/uL — ABNORMAL LOW (ref 3.87–5.11)
RDW: 14.5 % (ref 11.5–15.5)
WBC: 12.3 10*3/uL — ABNORMAL HIGH (ref 4.0–10.5)
nRBC: 0.2 % (ref 0.0–0.2)

## 2023-09-29 LAB — COMPREHENSIVE METABOLIC PANEL
ALT: 7 U/L (ref 0–44)
AST: 11 U/L — ABNORMAL LOW (ref 15–41)
Albumin: 2.5 g/dL — ABNORMAL LOW (ref 3.5–5.0)
Alkaline Phosphatase: 125 U/L (ref 38–126)
Anion gap: 10 (ref 5–15)
BUN: 5 mg/dL — ABNORMAL LOW (ref 6–20)
CO2: 20 mmol/L — ABNORMAL LOW (ref 22–32)
Calcium: 8.6 mg/dL — ABNORMAL LOW (ref 8.9–10.3)
Chloride: 108 mmol/L (ref 98–111)
Creatinine, Ser: 0.47 mg/dL (ref 0.44–1.00)
GFR, Estimated: >60 mL/min (ref >60)
Glucose, Bld: 83 mg/dL (ref 70–99)
Potassium: 3.2 mmol/L — ABNORMAL LOW (ref 3.5–5.1)
Sodium: 138 mmol/L (ref 135–145)
Total Bilirubin: 0.6 mg/dL (ref 0.0–1.2)
Total Protein: 6 g/dL — ABNORMAL LOW (ref 6.5–8.1)

## 2023-09-29 LAB — URINALYSIS, ROUTINE W REFLEX MICROSCOPIC
Bilirubin Urine: NEGATIVE
Glucose, UA: NEGATIVE mg/dL
Hgb urine dipstick: NEGATIVE
Ketones, ur: NEGATIVE mg/dL
Leukocytes,Ua: NEGATIVE
Nitrite: NEGATIVE
Protein, ur: NEGATIVE mg/dL
Specific Gravity, Urine: 1.001 — ABNORMAL LOW (ref 1.005–1.030)
pH: 7 (ref 5.0–8.0)

## 2023-09-29 LAB — CERVICOVAGINAL ANCILLARY ONLY
Chlamydia: NEGATIVE
Comment: NEGATIVE
Comment: NORMAL
Neisseria Gonorrhea: NEGATIVE

## 2023-09-29 LAB — PROTEIN / CREATININE RATIO, URINE
Creatinine, Urine: 12 mg/dL
Total Protein, Urine: <6 mg/dL

## 2023-09-29 NOTE — MAU Note (Signed)
 Saline Lock R AC placed by EMS before arrival to hospital is clean and dry

## 2023-09-29 NOTE — MAU Note (Signed)
OK to d/c EFM per Lisa Leftwich Kirby CNM 

## 2023-09-29 NOTE — MAU Provider Note (Signed)
 Chief Complaint:  Hypertension   Event Date/Time   First Provider Initiated Contact with Patient 09/29/23 [phone]      HPI: Julie Case is a 33 y.o. L2G4010 at [redacted]w[redacted]d who presents to maternity admissions via EMS reporting fatigue, swelling in feet/ankles, intermittent headaches, and elevated blood pressures 150s/90s at home.  She reports taking her BP at home several times yesterday and it would be normal when sitting/lying down but 150s/90s when standing, making dinner, etc.  She noticed that her feet were swelling when her BP was high. Her blood pressures are always normal in the office. She took her BP this morning and when it was 155/90, she called 911.  The first blood pressure by EMS was also 150s/90s. She reports good fetal movement, denies LOF, vaginal bleeding, vaginal itching/burning, urinary symptoms, h/a, dizziness, n/v, or fever/chills.     HPI  Past Medical History: Past Medical History:  Diagnosis Date   Depression    Epilepsy (HCC)     Past obstetric history: OB History  Gravida Para Term Preterm AB Living  9 5 5  0 3 5  SAB IAB Ectopic Multiple Live Births  2 0 1 0 5    # Outcome Date GA Lbr Len/2nd Weight Sex Type Anes PTL Lv  9 Current           8 SAB 07/12/22          7 SAB 01/21/22          6 Ectopic 2022     ECTOPIC     5 Term 05/21/17 [redacted]w[redacted]d  2863 g F Vag-Spont EPI N LIV  4 Term 04/10/16 [redacted]w[redacted]d  3260 g F Vag-Spont EPI N LIV  3 Term 09/21/11 [redacted]w[redacted]d  3260 g F Vag-Spont EPI N LIV  2 Term 11/22/09 [redacted]w[redacted]d  3175 g F Vag-Spont EPI N LIV  1 Term 07/26/08 [redacted]w[redacted]d  2948 g F Vag-Spont EPI N LIV    Past Surgical History: Past Surgical History:  Procedure Laterality Date   CHOLECYSTECTOMY     ECTOPIC PREGNANCY SURGERY     with right tube removal    Family History: Family History  Problem Relation Age of Onset   Diabetes Maternal Grandmother    Diabetes Maternal Grandfather    Hypertension Father    Hypercholesterolemia Father    Hypertension Mother     Hypercholesterolemia Mother    Schizophrenia Brother    Hypertension Other    Diabetes Other     Social History: Social History   Tobacco Use   Smoking status: Every Day    Current packs/day: 0.50    Types: Cigarettes   Smokeless tobacco: Never   Tobacco comments:    1/2 pack a day  Substance Use Topics   Alcohol use: Not Currently   Drug use: Not Currently    Allergies:  Allergies  Allergen Reactions   Penicillins Shortness Of Breath and Rash   Naproxen Nausea And Vomiting    Meds:  No medications prior to admission.    ROS:  Review of Systems  Constitutional:  Positive for fatigue. Negative for chills and fever.  Eyes:  Negative for visual disturbance.  Respiratory:  Negative for shortness of breath.   Cardiovascular:  Negative for chest pain.  Gastrointestinal:  Negative for abdominal pain, nausea and vomiting.  Genitourinary:  Negative for difficulty urinating, dysuria, flank pain, pelvic pain, vaginal bleeding, vaginal discharge and vaginal pain.  Neurological:  Positive for headaches. Negative for dizziness.  Psychiatric/Behavioral: Negative.  I have reviewed patient's Past Medical Hx, Surgical Hx, Family Hx, Social Hx, medications and allergies.   Physical Exam  Patient Vitals for the past 24 hrs:  BP Temp Pulse Resp SpO2 Height Weight  09/29/23 0806 131/86 -- 71 -- -- -- --  09/29/23 0631 128/76 -- (!) 58 -- -- -- --  09/29/23 0545 122/68 -- (!) 58 -- -- -- --  09/29/23 0531 118/71 -- 60 -- -- -- --  09/29/23 0457 130/75 -- 61 -- -- -- --  09/29/23 0450 130/80 -- 73 -- -- -- --  09/29/23 0446 -- 97.8 F (36.6 C) 64 17 99 % 5\' 5"  (1.651 m) 100.7 kg   Constitutional: Well-developed, well-nourished female in no acute distress.  Cardiovascular: normal rate Respiratory: normal effort GI: Abd soft, non-tender, gravid appropriate for gestational age.  MS: Extremities nontender, no edema, normal ROM Neurologic: Alert and oriented x 4.  GU: Neg  CVAT.  PELVIC EXAM: Deferred  Dilation: 3 Effacement (%): 50 Exam by:: Misty Stanley leftwich [employer] CNM  FHT:  Baseline 120 , moderate variability, accelerations present, no decelerations Contractions: rare, mild to palpation   Labs: Results for orders placed or performed during the hospital encounter of 09/29/23 (from the past 24 hours)  Urinalysis, Routine w reflex microscopic -Urine, Clean Catch     Status: Abnormal   Collection Time: 09/29/23  4:52 AM  Result Value Ref Range   Color, Urine STRAW (A) YELLOW   APPearance CLEAR CLEAR   Specific Gravity, Urine 1.001 (L) 1.005 - 1.030   pH 7.0 5.0 - 8.0   Glucose, UA NEGATIVE NEGATIVE mg/dL   Hgb urine dipstick NEGATIVE NEGATIVE   Bilirubin Urine NEGATIVE NEGATIVE   Ketones, ur NEGATIVE NEGATIVE mg/dL   Protein, ur NEGATIVE NEGATIVE mg/dL   Nitrite NEGATIVE NEGATIVE   Leukocytes,Ua NEGATIVE NEGATIVE  Protein / creatinine ratio, urine     Status: None   Collection Time: 09/29/23  4:52 AM  Result Value Ref Range   Creatinine, Urine 12 mg/dL   Total Protein, Urine <6 mg/dL   Protein Creatinine Ratio        0.00 - 0.15 mg/mg[Cre]  CBC     Status: Abnormal   Collection Time: 09/29/23  6:23 AM  Result Value Ref Range   WBC 12.3 (H) 4.0 - 10.5 K/uL   RBC 3.78 (L) 3.87 - 5.11 MIL/uL   Hemoglobin 11.3 (L) 12.0 - 15.0 g/dL   HCT 16.1 (L) 09.6 - 04.5 %   MCV 85.7 80.0 - 100.0 fL   MCH 29.9 26.0 - 34.0 pg   MCHC 34.9 30.0 - 36.0 g/dL   RDW 40.9 81.1 - 91.4 %   Platelets 167 150 - 400 K/uL   nRBC 0.2 0.0 - 0.2 %  Comprehensive metabolic panel     Status: Abnormal   Collection Time: 09/29/23  6:23 AM  Result Value Ref Range   Sodium 138 135 - 145 mmol/L   Potassium 3.2 (L) 3.5 - 5.1 mmol/L   Chloride 108 98 - 111 mmol/L   CO2 20 (L) 22 - 32 mmol/L   Glucose, Bld 83 70 - 99 mg/dL   BUN 5 (L) 6 - 20 mg/dL   Creatinine, Ser 7.82 0.44 - 1.00 mg/dL   Calcium 8.6 (L) 8.9 - 10.3 mg/dL   Total Protein 6.0 (L) 6.5 - 8.1 g/dL   Albumin 2.5  (L) 3.5 - 5.0 g/dL   AST 11 (L) 15 - 41 U/L   ALT 7  0 - 44 U/L   Alkaline Phosphatase 125 38 - 126 U/L   Total Bilirubin 0.6 0.0 - 1.2 mg/dL   GFR, Estimated >16 >10 mL/min   Anion gap 10 5 - 15   O/Positive/-- (09/18 1505)  Imaging:  No results found.  MAU Course/MDM: Orders Placed This Encounter  Procedures   Urinalysis, Routine w reflex microscopic -Urine, Clean Catch   CBC   Comprehensive metabolic panel   Protein / creatinine ratio, urine   Discharge patient Discharge disposition: 01-Home or Self Care; Discharge patient date: 09/29/2023    No orders of the defined types were placed in this encounter.    NST reviewed and reactive BP grossly normal in MAU, but given intermittent h/a and elevated BP with EMS, PEC lab ordered with an abundance of caution PEC labs wnl, h/a resolved in MAU D/C home with PEC precautions [facility] to follow up with BP check tomorrow or Tuesday Return to MAU as needed for signs of labor or emergencies.    Assessment: 1. Restless leg syndrome   2. Elevated blood pressure reading without diagnosis of hypertension   3. [redacted] weeks gestation of pregnancy   4. Fatigue during pregnancy in third trimester   5. Headache in pregnancy, antepartum, third trimester     Plan: Discharge home Labor precautions and fetal kick counts  Follow-up Information     [facility] Follow up in 1 day(s).   Specialty: Obstetrics and Gynecology Why: Blood pressure check on Monday Contact information: [address] [place] [address] [phone]               Allergies as of 09/29/2023       Reactions   Penicillins Shortness Of Breath, Rash   Naproxen Nausea And Vomiting        Medication List     TAKE these medications    Blood Pressure Monitor Misc For regular home bp monitoring during pregnancy   diphenhydrAMINE 25 mg capsule Commonly known as: BENADRYL Take 25 mg by  mouth every 6 (six) hours as needed.   fluticasone 50 MCG/ACT nasal spray Commonly known as: FLONASE Place 2 sprays into both nostrils daily.   Magnesium Glycinate 120 MG Caps Take 3 tablets by mouth daily.   ondansetron 4 MG disintegrating tablet Commonly known as: ZOFRAN-ODT Take 1 tablet (4 mg total) by mouth every 8 (eight) hours as needed for nausea or vomiting.   PRENATAL VITAMIN PO Take by mouth.   TYLENOL PO Take by mouth.        Sharen Counter Certified Nurse-Midwife 09/29/2023 9:20 AM

## 2023-09-29 NOTE — MAU Note (Addendum)
.  Julie Case is a 33 y.o. at [redacted]w[redacted]d here in MAU reporting HTN yesterday and this morning. When she is up and around her B/P is 150s but when she lays down her b/p is lower. No hx HTN this pregnancy. States she took her B/P on and off all day yesterday. When she saw her b/p in 150s she became worried and called EMS. Reports good FM and denies LOF or VB. Occ pelvic pain. Pt does have a h/a. Was a 10 when EMS arrived at her house but now a 5. No meds taken for pain  LMP: n/a Onset of complaint: this am Pain score: 5 Vitals:   09/29/23 0446  Pulse: 64  Resp: 17  Temp: 97.8 F (36.6 C)  SpO2: 99%     FHT: 135  Lab orders placed from triage: u/a

## 2023-10-03 ENCOUNTER — Encounter: Payer: Self-pay | Admitting: Advanced Practice Midwife

## 2023-10-03 ENCOUNTER — Ambulatory Visit: Payer: Medicaid Other | Admitting: Advanced Practice Midwife

## 2023-10-03 VITALS — BP 135/91 | HR 90 | Wt 218.0 lb

## 2023-10-03 DIAGNOSIS — Z3480 Encounter for supervision of other normal pregnancy, unspecified trimester: Secondary | ICD-10-CM

## 2023-10-03 DIAGNOSIS — Z3A37 37 weeks gestation of pregnancy: Secondary | ICD-10-CM

## 2023-10-03 DIAGNOSIS — O133 Gestational [pregnancy-induced] hypertension without significant proteinuria, third trimester: Secondary | ICD-10-CM | POA: Diagnosis not present

## 2023-10-03 NOTE — Progress Notes (Signed)
   LOW-RISK PREGNANCY VISIT Patient name: Julie Case MRN [id number]  Date of birth: 1990/11/09 Chief Complaint:   Routine Prenatal Visit (Cervix check)  History of Present Illness:   Julie Case is a 33 y.o. [name] female at [redacted]w[redacted]d with an Estimated Date of Delivery: 10/22/23 being seen today for ongoing management of a low-risk pregnancy.  Today she reports no complaints. Contractions: Regular.  .  Movement: Present. denies leaking of fluid. Review of Systems:   Pertinent items are noted in HPI Denies abnormal vaginal discharge w/ itching/odor/irritation, headaches, visual changes, shortness of breath, chest pain, abdominal pain, severe nausea/vomiting, or problems with urination or bowel movements unless otherwise stated above. Pertinent History Reviewed:  Reviewed past medical,surgical, social, obstetrical and family history.  Reviewed problem list, medications and allergies. Physical Assessment:   Vitals:   10/03/23 1416 10/03/23 1446  BP: (!) 130/90 (!) 135/91  Pulse: 91 90  Weight: 218 lb (98.9 kg)   Body mass index is 36.28 kg/m.        Physical Examination:   General appearance: Well appearing, and in no distress  Mental status: Alert, oriented to person, place, and time  Skin: Warm & dry  Cardiovascular: Normal heart rate noted  Respiratory: Normal respiratory effort, no distress  Abdomen: Soft, gravid, nontender  Pelvic: Cervical exam performed  Dilation: 4 Effacement (%): 50 Station: -2  Extremities: Edema: None Chaperone:  Peggy Dones   Fetal Status: Fetal Heart Rate (bpm): 140   Movement: Present      No results found for this or any previous visit (from the past 24 hours).  Assessment & Plan:    Pregnancy: [id number] at [redacted]w[redacted]d 1. Supervision of other normal pregnancy, antepartum (Primary)   2. [redacted] weeks gestation of pregnancy      Meds: No orders of the defined types were placed in this encounter.  Labs/procedures today: none  Plan:  IOL set for 3/8 am,  orders in   If BP in severe range prior to IOL, go to MAU  Follow-up: Return for cancel all fututre appts.  Future Appointments  Date Time Provider Department Center  10/05/2023  6:45 AM [facility] SCHED ROOM [facility] None  10/10/2023  2:50 PM Cheral Marker, CNM [facility]  10/17/2023  2:50 PM Cheral Marker, CNM [facility]    No orders of the defined types were placed in this encounter.  Jacklyn Shell DNP, CNM 10/03/2023 3:01 PM

## 2023-10-03 NOTE — Patient Instructions (Signed)
 If you are still pregnant on 10/05/23, Labor and Delivery will call you when they have a slot open.  They will give you about an hour to get there.  If you are late (or don't answer your phone) your time slot will be given to the next person in line and you will be given a later time slot.  You will go to [facility], ([address] Entr[address] in [place], [place]) to start your induction!  Eat a light meal before you come.  Daisey Must!!

## 2023-10-04 ENCOUNTER — Telehealth: Payer: Self-pay | Admitting: *Deleted

## 2023-10-04 ENCOUNTER — Encounter (HOSPITAL_COMMUNITY): Payer: Self-pay | Admitting: *Deleted

## 2023-10-04 ENCOUNTER — Telehealth (HOSPITAL_COMMUNITY): Payer: Self-pay | Admitting: *Deleted

## 2023-10-04 NOTE — Telephone Encounter (Signed)
 Preadmission screen

## 2023-10-04 NOTE — Telephone Encounter (Signed)
 Returned patient's call. States she checked her blood pressure at 6am and it was 170/92 and current BP is 154/99.  She does have a slight headache but has not taken Tylenol. Discussed with Dr Charlotta Newton and patient advised to take 2 extra strength Tylenol, rest and if headache does not improve with Tylenol or worsen, to go to [facility].  Pt verbalized understanding with no further questions.

## 2023-10-04 NOTE — Telephone Encounter (Signed)
 Pt states her blood pressure is elevated and would like a call back from the office.

## 2023-10-05 ENCOUNTER — Inpatient Hospital Stay (HOSPITAL_COMMUNITY): Admission: RE | Admit: 2023-10-05 | Source: Ambulatory Visit

## 2023-10-06 ENCOUNTER — Inpatient Hospital Stay (HOSPITAL_COMMUNITY)
Admission: RE | Admit: 2023-10-06 | Discharge: 2023-10-08 | DRG: 798 | Disposition: A | Discharge status: Home / Self Care | Attending: Family Medicine | Admitting: Family Medicine

## 2023-10-06 ENCOUNTER — Inpatient Hospital Stay (HOSPITAL_COMMUNITY): Admitting: Anesthesiology

## 2023-10-06 ENCOUNTER — Encounter (HOSPITAL_COMMUNITY): Payer: Self-pay | Admitting: Family Medicine

## 2023-10-06 ENCOUNTER — Other Ambulatory Visit: Payer: Self-pay

## 2023-10-06 DIAGNOSIS — G2581 Restless legs syndrome: Secondary | ICD-10-CM | POA: Diagnosis present

## 2023-10-06 DIAGNOSIS — Z8669 Personal history of other diseases of the nervous system and sense organs: Secondary | ICD-10-CM

## 2023-10-06 DIAGNOSIS — O134 Gestational [pregnancy-induced] hypertension without significant proteinuria, complicating childbirth: Secondary | ICD-10-CM | POA: Diagnosis not present

## 2023-10-06 DIAGNOSIS — F17210 Nicotine dependence, cigarettes, uncomplicated: Secondary | ICD-10-CM | POA: Diagnosis present

## 2023-10-06 DIAGNOSIS — Z8249 Family history of ischemic heart disease and other diseases of the circulatory system: Secondary | ICD-10-CM | POA: Diagnosis not present

## 2023-10-06 DIAGNOSIS — Z9851 Tubal ligation status: Secondary | ICD-10-CM

## 2023-10-06 DIAGNOSIS — O99214 Obesity complicating childbirth: Secondary | ICD-10-CM | POA: Diagnosis present

## 2023-10-06 DIAGNOSIS — E669 Obesity, unspecified: Secondary | ICD-10-CM | POA: Diagnosis present

## 2023-10-06 DIAGNOSIS — Z3490 Encounter for supervision of normal pregnancy, unspecified, unspecified trimester: Secondary | ICD-10-CM

## 2023-10-06 DIAGNOSIS — Z880 Allergy status to penicillin: Secondary | ICD-10-CM

## 2023-10-06 DIAGNOSIS — Z3A37 37 weeks gestation of pregnancy: Secondary | ICD-10-CM | POA: Diagnosis not present

## 2023-10-06 DIAGNOSIS — O133 Gestational [pregnancy-induced] hypertension without significant proteinuria, third trimester: Secondary | ICD-10-CM

## 2023-10-06 DIAGNOSIS — Z302 Encounter for sterilization: Secondary | ICD-10-CM | POA: Diagnosis not present

## 2023-10-06 DIAGNOSIS — O139 Gestational [pregnancy-induced] hypertension without significant proteinuria, unspecified trimester: Secondary | ICD-10-CM | POA: Diagnosis present

## 2023-10-06 DIAGNOSIS — F32A Depression, unspecified: Secondary | ICD-10-CM | POA: Diagnosis not present

## 2023-10-06 DIAGNOSIS — Z833 Family history of diabetes mellitus: Secondary | ICD-10-CM

## 2023-10-06 DIAGNOSIS — I10 Essential (primary) hypertension: Secondary | ICD-10-CM | POA: Diagnosis not present

## 2023-10-06 DIAGNOSIS — O99334 Smoking (tobacco) complicating childbirth: Secondary | ICD-10-CM | POA: Diagnosis present

## 2023-10-06 DIAGNOSIS — Z8759 Personal history of other complications of pregnancy, childbirth and the puerperium: Secondary | ICD-10-CM | POA: Diagnosis present

## 2023-10-06 LAB — COMPREHENSIVE METABOLIC PANEL
ALT: 8 U/L (ref 0–44)
AST: 12 U/L — ABNORMAL LOW (ref 15–41)
Albumin: 2.6 g/dL — ABNORMAL LOW (ref 3.5–5.0)
Alkaline Phosphatase: 136 U/L — ABNORMAL HIGH (ref 38–126)
Anion gap: 10 (ref 5–15)
BUN: 7 mg/dL (ref 6–20)
CO2: 22 mmol/L (ref 22–32)
Calcium: 9.5 mg/dL (ref 8.9–10.3)
Chloride: 107 mmol/L (ref 98–111)
Creatinine, Ser: 0.48 mg/dL (ref 0.44–1.00)
GFR, Estimated: >60 mL/min (ref >60)
Glucose, Bld: 92 mg/dL (ref 70–99)
Potassium: 3.3 mmol/L — ABNORMAL LOW (ref 3.5–5.1)
Sodium: 139 mmol/L (ref 135–145)
Total Bilirubin: 0.7 mg/dL (ref 0.0–1.2)
Total Protein: 5.9 g/dL — ABNORMAL LOW (ref 6.5–8.1)

## 2023-10-06 LAB — CBC
HCT: 34.6 % — ABNORMAL LOW (ref 36.0–46.0)
Hemoglobin: 12 g/dL (ref 12.0–15.0)
MCH: 29.9 pg (ref 26.0–34.0)
MCHC: 34.7 g/dL (ref 30.0–36.0)
MCV: 86.3 fL (ref 80.0–100.0)
Platelets: 185 10*3/uL (ref 150–400)
RBC: 4.01 MIL/uL (ref 3.87–5.11)
RDW: 14.3 % (ref 11.5–15.5)
WBC: 11.2 10*3/uL — ABNORMAL HIGH (ref 4.0–10.5)
nRBC: 0 % (ref 0.0–0.2)

## 2023-10-06 LAB — RPR: RPR Ser Ql: NONREACTIVE

## 2023-10-06 LAB — PROTEIN / CREATININE RATIO, URINE
Creatinine, Urine: 54 mg/dL
Total Protein, Urine: <6 mg/dL

## 2023-10-06 LAB — TYPE AND SCREEN
ABO/RH(D): O POS
Antibody Screen: NEGATIVE

## 2023-10-06 MED ORDER — EPHEDRINE 5 MG/ML INJ: 10.0000 mg | INTRAVENOUS | Status: DC | PRN

## 2023-10-06 MED ORDER — OXYTOCIN-SODIUM CHLORIDE 30-0.9 UT/500ML-% IV SOLN
2.5000 [IU]/h | INTRAVENOUS | Status: DC
  Administered 2023-10-06: 2.5 [IU]/h via INTRAVENOUS

## 2023-10-06 MED ORDER — PRENATAL MULTIVITAMIN CH
1.0000 | ORAL_TABLET | Freq: Every day | ORAL | Status: DC
  Administered 2023-10-06: 1 via ORAL
  Filled 2023-10-06 (×3): qty 1

## 2023-10-06 MED ORDER — LACTATED RINGERS IV SOLN: 500.0000 mL | INTRAVENOUS | Status: DC | PRN

## 2023-10-06 MED ORDER — FUROSEMIDE 20 MG PO TABS
40.0000 mg | ORAL_TABLET | Freq: Every day | ORAL | Status: DC
  Administered 2023-10-07 – 2023-10-08 (×2): 40 mg via ORAL
  Filled 2023-10-06 (×2): qty 2

## 2023-10-06 MED ORDER — DIBUCAINE (PERIANAL) 1 % EX OINT: 1.0000 | TOPICAL_OINTMENT | CUTANEOUS | Status: DC | PRN

## 2023-10-06 MED ORDER — PHENYLEPHRINE 80 MCG/ML (10ML) SYRINGE FOR IV PUSH (FOR BLOOD PRESSURE SUPPORT): 80.0000 ug | PREFILLED_SYRINGE | INTRAVENOUS | Status: DC | PRN

## 2023-10-06 MED ORDER — LIDOCAINE HCL (PF) 1 % IJ SOLN: 30.0000 mL | INTRAMUSCULAR | Status: DC | PRN

## 2023-10-06 MED ORDER — ZOLPIDEM TARTRATE 5 MG PO TABS: 5.0000 mg | ORAL_TABLET | Freq: Every evening | ORAL | Status: DC | PRN

## 2023-10-06 MED ORDER — OXYCODONE HCL 5 MG PO TABS
10.0000 mg | ORAL_TABLET | Freq: Four times a day (QID) | ORAL | Status: DC | PRN
  Administered 2023-10-08: 10 mg via ORAL
  Filled 2023-10-06: qty 2

## 2023-10-06 MED ORDER — ACETAMINOPHEN 500 MG PO TABS
1000.0000 mg | ORAL_TABLET | Freq: Three times a day (TID) | ORAL | Status: DC
  Administered 2023-10-06 – 2023-10-08 (×7): 1000 mg via ORAL
  Filled 2023-10-06 (×6): qty 2

## 2023-10-06 MED ORDER — OXYCODONE HCL 5 MG PO TABS
5.0000 mg | ORAL_TABLET | Freq: Four times a day (QID) | ORAL | Status: DC | PRN
  Administered 2023-10-07: 5 mg via ORAL
  Filled 2023-10-06: qty 1

## 2023-10-06 MED ORDER — OXYCODONE-ACETAMINOPHEN 5-325 MG PO TABS: 2.0000 | ORAL_TABLET | ORAL | Status: DC | PRN

## 2023-10-06 MED ORDER — POTASSIUM CHLORIDE CRYS ER 20 MEQ PO TBCR: 20.0000 meq | EXTENDED_RELEASE_TABLET | Freq: Every day | ORAL | Status: DC

## 2023-10-06 MED ORDER — ONDANSETRON HCL 4 MG PO TABS: 4.0000 mg | ORAL_TABLET | ORAL | Status: DC | PRN

## 2023-10-06 MED ORDER — COCONUT OIL OIL: 1.0000 | TOPICAL_OIL | Status: DC | PRN

## 2023-10-06 MED ORDER — DIPHENHYDRAMINE HCL 25 MG PO CAPS: 25.0000 mg | ORAL_CAPSULE | Freq: Four times a day (QID) | ORAL | Status: DC | PRN

## 2023-10-06 MED ORDER — SOD CITRATE-CITRIC ACID 500-334 MG/5ML PO SOLN: 30.0000 mL | ORAL | Status: DC | PRN

## 2023-10-06 MED ORDER — MEDROXYPROGESTERONE ACETATE 150 MG/ML IM SUSP: 150.0000 mg | INTRAMUSCULAR | Status: DC | PRN

## 2023-10-06 MED ORDER — DIPHENHYDRAMINE HCL 50 MG/ML IJ SOLN: 12.5000 mg | INTRAMUSCULAR | Status: DC | PRN

## 2023-10-06 MED ORDER — TERBUTALINE SULFATE 1 MG/ML IJ SOLN: 0.2500 mg | Freq: Once | INTRAMUSCULAR | Status: DC | PRN

## 2023-10-06 MED ORDER — FENTANYL-BUPIVACAINE-NACL 0.5-0.125-0.9 MG/250ML-% EP SOLN
12.0000 mL/h | EPIDURAL | Status: DC | PRN
  Administered 2023-10-06: 12 mL/h via EPIDURAL
  Filled 2023-10-06: qty 250

## 2023-10-06 MED ORDER — OXYCODONE-ACETAMINOPHEN 5-325 MG PO TABS: 1.0000 | ORAL_TABLET | ORAL | Status: DC | PRN

## 2023-10-06 MED ORDER — ACETAMINOPHEN 325 MG PO TABS: 650.0000 mg | ORAL_TABLET | ORAL | Status: DC | PRN

## 2023-10-06 MED ORDER — OXYTOCIN BOLUS FROM INFUSION
333.0000 mL | Freq: Once | INTRAVENOUS | Status: AC
  Administered 2023-10-06: 333 mL via INTRAVENOUS

## 2023-10-06 MED ORDER — SIMETHICONE 80 MG PO CHEW: 80.0000 mg | CHEWABLE_TABLET | ORAL | Status: DC | PRN

## 2023-10-06 MED ORDER — LIDOCAINE HCL (PF) 1 % IJ SOLN
INTRAMUSCULAR | Status: DC | PRN
  Administered 2023-10-06 (×2): 4 mL via EPIDURAL

## 2023-10-06 MED ORDER — HYDROXYZINE HCL 50 MG PO TABS: 50.0000 mg | ORAL_TABLET | Freq: Four times a day (QID) | ORAL | Status: DC | PRN

## 2023-10-06 MED ORDER — LACTATED RINGERS IV SOLN: 500.0000 mL | Freq: Once | INTRAVENOUS | Status: DC

## 2023-10-06 MED ORDER — FLEET ENEMA RE ENEM: 1.0000 | ENEMA | RECTAL | Status: DC | PRN

## 2023-10-06 MED ORDER — BENZOCAINE-MENTHOL 20-0.5 % EX AERO
1.0000 | INHALATION_SPRAY | CUTANEOUS | Status: DC | PRN
  Filled 2023-10-06: qty 56

## 2023-10-06 MED ORDER — SENNOSIDES-DOCUSATE SODIUM 8.6-50 MG PO TABS
2.0000 | ORAL_TABLET | Freq: Every day | ORAL | Status: DC
Start: 2023-10-07 — End: 2023-10-08
  Filled 2023-10-06 (×2): qty 2

## 2023-10-06 MED ORDER — WITCH HAZEL-GLYCERIN EX PADS: 1.0000 | MEDICATED_PAD | CUTANEOUS | Status: DC | PRN

## 2023-10-06 MED ORDER — LACTATED RINGERS IV SOLN
INTRAVENOUS | Status: DC
Start: 2023-10-06 — End: 2023-10-07

## 2023-10-06 MED ORDER — ONDANSETRON HCL 4 MG/2ML IJ SOLN: 4.0000 mg | Freq: Four times a day (QID) | INTRAMUSCULAR | Status: DC | PRN

## 2023-10-06 MED ORDER — NIFEDIPINE ER OSMOTIC RELEASE 30 MG PO TB24
30.0000 mg | ORAL_TABLET | Freq: Every day | ORAL | Status: DC
  Administered 2023-10-06 – 2023-10-08 (×3): 30 mg via ORAL
  Filled 2023-10-06 (×3): qty 1

## 2023-10-06 MED ORDER — PHENYLEPHRINE 80 MCG/ML (10ML) SYRINGE FOR IV PUSH (FOR BLOOD PRESSURE SUPPORT)
80.0000 ug | PREFILLED_SYRINGE | INTRAVENOUS | Status: DC | PRN
  Filled 2023-10-06: qty 10

## 2023-10-06 MED ORDER — ONDANSETRON HCL 4 MG/2ML IJ SOLN
4.0000 mg | INTRAMUSCULAR | Status: DC | PRN
  Filled 2023-10-06: qty 2

## 2023-10-06 MED ORDER — FENTANYL CITRATE (PF) 100 MCG/2ML IJ SOLN
100.0000 ug | INTRAMUSCULAR | Status: DC | PRN
  Administered 2023-10-06: 100 ug via INTRAVENOUS
  Filled 2023-10-06: qty 2

## 2023-10-06 MED ORDER — POTASSIUM CHLORIDE CRYS ER 20 MEQ PO TBCR
20.0000 meq | EXTENDED_RELEASE_TABLET | Freq: Two times a day (BID) | ORAL | Status: AC
  Administered 2023-10-06 – 2023-10-08 (×5): 20 meq via ORAL
  Filled 2023-10-06 (×5): qty 1

## 2023-10-06 MED ORDER — OXYTOCIN-SODIUM CHLORIDE 30-0.9 UT/500ML-% IV SOLN
1.0000 m[IU]/min | INTRAVENOUS | Status: DC
  Administered 2023-10-06: 2 m[IU]/min via INTRAVENOUS
  Filled 2023-10-06: qty 500

## 2023-10-06 MED ORDER — LACTATED RINGERS IV SOLN: INTRAVENOUS | Status: DC

## 2023-10-06 NOTE — Discharge Summary (Signed)
 Postpartum Discharge Summary  Date of Service updated***     Patient Name: Julie Case DOB: March 28, 1991 MRN: [id number]  Date of admission: 10/06/2023 Delivery date:10/06/2023 Delivering provider: Joanne Gavel Date of discharge: 10/06/2023  Admitting diagnosis: Gestational hypertension [O13.9] Intrauterine pregnancy: [redacted]w[redacted]d     Secondary diagnosis:  Principal Problem:   Gestational hypertension Active Problems:   Obesity (BMI 35.0-39.9 without comorbidity)   Encounter for supervision of normal pregnancy, antepartum   History of seizure disorder   Restless leg syndrome  Additional problems: ***    Discharge diagnosis: Term Pregnancy Delivered and Gestational Hypertension                                              Post partum procedures:*** Augmentation: AROM and Pitocin Complications: None  Hospital course: Induction of Labor With Vaginal Delivery   33 y.o. yo [id number] at [redacted]w[redacted]d was admitted to the hospital 10/06/2023 for induction of labor.  Indication for induction: Gestational hypertension.  Patient had an labor course complicated by none. Membrane Rupture Time/Date: 3:23 AM,10/06/2023  Delivery Method:Vaginal, Spontaneous Operative Delivery:N/A Episiotomy: None Lacerations:    Details of delivery can be found in separate delivery note.  Patient had a postpartum course complicated by***. Patient is discharged home 10/06/23.  Newborn Data: Birth date:10/06/2023 Birth time:7:37 AM Gender:Female Living status:Living Apgars:9 ,10  [phone] g  Magnesium Sulfate received: No BMZ received: No Rhophylac:No MMR:No T-DaP:Given prenatally Flu: No RSV Vaccine received: No Transfusion:{Transfusion received:30440034}  Immunizations received: Immunization History  Administered Date(s) Administered   Tdap 04/23/2022, 08/14/2023    Physical exam  Vitals:   10/06/23 0818 10/06/23 0831 10/06/23 0847 10/06/23 0912  BP: (!) 90/55 110/79 (!) 143/72 (!) (P) 145/72  Pulse:  (!) 173 85 (!) 121 (!) (P) 53  Resp:    (P) 20  Temp:    (P) 98.2 F (36.8 C)  TempSrc:    (P) Oral  SpO2:    (P) 98%   General: {Exam; general:21111117} Lochia: {Desc; appropriate/inappropriate:30686::"appropriate"} Uterine Fundus: {Desc; firm/soft:30687} Incision: {Exam; incision:21111123} DVT Evaluation: {Exam; HYQ:6578469} Labs: Lab Results  Component Value Date   WBC 11.2 (H) 10/06/2023   HGB 12.0 10/06/2023   HCT 34.6 (L) 10/06/2023   MCV 86.3 10/06/2023   PLT 185 10/06/2023      Latest Ref Rng & Units 10/06/2023    3:12 AM  CMP  Glucose 70 - 99 mg/dL 92   BUN 6 - 20 mg/dL 7   Creatinine 6.29 - 5.28 mg/dL 4.13   Sodium 244 - 010 mmol/L 139   Potassium 3.5 - 5.1 mmol/L 3.3   Chloride 98 - 111 mmol/L 107   CO2 22 - 32 mmol/L 22   Calcium 8.9 - 10.3 mg/dL 9.5   Total Protein 6.5 - 8.1 g/dL 5.9   Total Bilirubin 0.0 - 1.2 mg/dL 0.7   Alkaline Phos 38 - 126 U/L 136   AST 15 - 41 U/L 12   ALT 0 - 44 U/L 8    Edinburgh Score:     No data to display         No data recorded  After visit meds:  Allergies as of 10/06/2023       Reactions   Penicillins Shortness Of Breath, Rash   Naproxen Nausea And Vomiting     Med Rec must be completed prior  to using this [facility]***        Discharge home in stable condition Infant Feeding: {Baby feeding:23562} Infant Disposition:{CHL IP OB HOME WITH ZOXWRU:04540} Discharge instruction: per After Visit Summary and Postpartum booklet. Activity: Advance as tolerated. Pelvic rest for 6 weeks.  Diet: {OB JWJX:91478295} Future Appointments: Future Appointments  Date Time Provider Department Center  10/10/2023  2:50 PM Cheral Marker, CNM [facility]  10/17/2023  2:50 PM Cheral Marker, CNM [facility]   Follow up Visit:  Message sent to [facility] 3/9  Please schedule this patient for a In person postpartum visit in 6 weeks with the following provider: Any provider. Additional Postpartum F/U:BP check 1 week   Low risk pregnancy complicated by: HTN Delivery mode:  Vaginal, Spontaneous Anticipated Birth Control:   tubal   10/06/2023 Joanne Gavel, MD

## 2023-10-06 NOTE — Anesthesia Postprocedure Evaluation (Signed)
 Anesthesia Post Note  Patient: Julie Case  Procedure(s) Performed: AN AD HOC LABOR EPIDURAL     Patient location during evaluation: Mother Baby Anesthesia Type: Epidural Level of consciousness: awake and alert Pain management: pain level controlled Vital Signs Assessment: post-procedure vital signs reviewed and stable Respiratory status: spontaneous breathing, nonlabored ventilation and respiratory function stable Cardiovascular status: stable Postop Assessment: no headache, no backache, epidural receding, no apparent nausea or vomiting, patient able to bend at knees, able to ambulate and adequate PO intake Anesthetic complications: no   No notable events documented.  Last Vitals:  Vitals:   10/06/23 1131 10/06/23 1439  BP: (!) 140/72 139/79  Pulse: 68 63  Resp: 20 18  Temp: 36.9 C   SpO2:  98%    Last Pain:  Vitals:   10/06/23 1131  TempSrc: Oral  PainSc: 3    Pain Goal:                   [employer]

## 2023-10-06 NOTE — Progress Notes (Signed)
 LABOR PROGRESS NOTE  Patient Name: Julie Case, female   DOB: January 30, 1991, 33 y.o.  MRN: [id number]  Patient starting to feel irregular painful contractions per RN. Fentanyl x1. Pit started 0556 to increase frequency of contractions. Cat I.  Joanne Gavel, MD

## 2023-10-06 NOTE — Anesthesia Preprocedure Evaluation (Signed)
 Anesthesia Evaluation  Patient identified by MRN, date of birth, ID band Patient awake    Reviewed: Allergy & Precautions, Patient's Chart, lab work & pertinent test results  Airway Mallampati: III  TM Distance: >3 FB Neck ROM: Full    Dental no notable dental hx.    Pulmonary Current Smoker   Pulmonary exam normal breath sounds clear to auscultation       Cardiovascular hypertension (PIH), Normal cardiovascular exam Rhythm:Regular Rate:Normal     Neuro/Psych Seizures -, Well Controlled,  PSYCHIATRIC DISORDERS  Depression       GI/Hepatic negative GI ROS, Neg liver ROS,,,  Endo/Other  BMI 36  Renal/GU negative Renal ROS  negative genitourinary   Musculoskeletal negative musculoskeletal ROS (+)    Abdominal  (+) + obese  Peds negative pediatric ROS (+)  Hematology negative hematology ROS (+) Hb 12, plt 185   Anesthesia Other Findings   Reproductive/Obstetrics (+) Pregnancy                             Anesthesia Physical Anesthesia Plan  ASA: 3  Anesthesia Plan: Epidural   Post-op Pain Management:    Induction:   PONV Risk Score and Plan: 2  Airway Management Planned: Natural Airway  Additional Equipment: None  Intra-op Plan:   Post-operative Plan:   Informed Consent: I have reviewed the patients History and Physical, chart, labs and discussed the procedure including the risks, benefits and alternatives for the proposed anesthesia with the patient or authorized representative who has indicated his/her understanding and acceptance.       Plan Discussed with:   Anesthesia Plan Comments:        Anesthesia Quick Evaluation

## 2023-10-06 NOTE — Progress Notes (Signed)
 33 y.o. [name] with undesired fertility,status post vaginal delivery, desires permanent sterilization. Risks and benefits of procedure discussed with patient including permanence of method, bleeding, infection, injury to surrounding organs and need for additional procedures. Risk failure of 0.5-1% with increased risk of ectopic gestation if pregnancy occurs was also discussed with patient. Patient verbalized understanding and all questions were answered. She requests salpingectomy, has had one tube removed for an ectopic pregnancy.  Currie Paris Debroah Loop MD 10/06/2023 2:01 PM    Patient ID: Julie Case, female   DOB: 1991/04/13, 33 y.o.   MRN: [id number]

## 2023-10-06 NOTE — Anesthesia Procedure Notes (Signed)
 Epidural Patient location during procedure: OB Start time: 10/06/2023 7:10 AM End time: 10/06/2023 7:15 AM  Staffing Anesthesiologist: Linton Rump, MD Performed: anesthesiologist   Preanesthetic Checklist Completed: patient identified, IV checked, site marked, risks and benefits discussed, surgical consent, monitors and equipment checked, pre-op evaluation and timeout performed  Epidural Patient position: sitting Prep: DuraPrep and site prepped and draped Patient monitoring: continuous pulse ox and blood pressure Approach: midline Location: L3-L4 Injection technique: LOR saline  Needle:  Needle type: Tuohy  Needle gauge: 17 G Needle length: 9 cm and 9 Needle insertion depth: 6.5 cm Catheter type: closed end flexible Catheter size: 19 Gauge Catheter at skin depth: 11 cm Test dose: negative  Assessment Events: blood not aspirated, no cerebrospinal fluid, injection not painful, no injection resistance, no paresthesia and negative IV test  Additional Notes The patient has requested an epidural for labor pain management. Risks and benefits including, but not limited to, infection, bleeding, local anesthetic toxicity, headache, hypotension, back pain, block failure, etc. were discussed with the patient. The patient expressed understanding and consented to the procedure. I confirmed that the patient has no bleeding disorders and is not taking blood thinners. I confirmed the patient's last platelet count with the nurse. A time-out was performed immediately prior to the procedure. Please see nursing documentation for vital signs. Sterile technique was used throughout the whole procedure. Once LOR achieved, the epidural catheter threaded easily without resistance. Aspiration of the catheter was negative for blood and CSF. The epidural was dosed slowly and an infusion was started.  1 attempt(s)Reason for block:procedure for pain

## 2023-10-06 NOTE — H&P (Cosign Needed Addendum)
 OBSTETRIC ADMISSION HISTORY AND PHYSICAL  Julie Case is a 33 y.o. female [phone] with IUP at [redacted]w[redacted]d by LMP c/w [place] at 7 weeks presenting for IOL for GHTN. She reports +FMs, No LOF, no VB, no blurry vision, peripheral edema, and RUQ pain. Mild headaches which resolve with Tylenol. She plans on bottle feeding. She requests tubal ligation for birth control. R tube previously removed during ectopic surgery. She received her prenatal care at [facility]   Dating: By [place] 7 weeks --->  Estimated Date of Delivery: 10/22/23  Sono:    @[redacted]w[redacted]d , CWD, mild left renal pelvic dilatation 6.6 mm, right renal pelvis 3 mm WNL, cephalic presentation, posterior placental lie, 1960 g, 42% EFW   Prenatal History/Complications: GHTN Previous Ectopic pregnancy --> only has single L tube  Past Medical History: Past Medical History:  Diagnosis Date   Depression    Epilepsy (HCC)    Pregnancy induced hypertension     Past Surgical History: Past Surgical History:  Procedure Laterality Date   CHOLECYSTECTOMY     ECTOPIC PREGNANCY SURGERY     with right tube removal    Obstetrical History: OB History     Gravida  9   Para  5   Term  5   Preterm  0   AB  3   Living  5      SAB  2   IAB  0   Ectopic  1   Multiple  0   Live Births  5           Social History Social History   Socioeconomic History   Marital status: Single    Spouse name: Not on file   Number of children: Not on file   Years of education: Not on file   Highest education level: 11th grade  Occupational History   Not on file  Tobacco Use   Smoking status: Every Day    Current packs/day: 0.50    Types: Cigarettes   Smokeless tobacco: Never   Tobacco comments:    1/2 pack a day  Vaping Use   Vaping status: Not on file  Substance and Sexual Activity   Alcohol use: Not Currently   Drug use: Not Currently   Sexual activity: Yes    Birth control/protection: None  Other Topics Concern   Not on file  Social  History Narrative   Not on file   Social Drivers of Health   Financial Resource Strain: Low Risk  (08/02/2023)   Overall Financial Resource Strain (CARDIA)    Difficulty of Paying Living Expenses: Not hard at all  Food Insecurity: No Food Insecurity (10/06/2023)   Hunger Vital Sign    Worried About Running Out of Food in the Last Year: Never true    Ran Out of Food in the Last Year: Never true  Transportation Needs: No Transportation Needs (10/06/2023)   PRAPARE - Administrator, Civil Service (Medical): No    Lack of Transportation (Non-Medical): No  Physical Activity: Unknown (08/02/2023)   Exercise Vital Sign    Days of Exercise per Week: 3 days    Minutes of Exercise per Session: Patient declined  Stress: No Stress Concern Present (08/02/2023)   [employer] of Occupational Health - Occupational Stress Questionnaire    Feeling of Stress : Only a little  Social Connections: Unknown (08/02/2023)   Social Connection and Isolation Panel [NHANES]    Frequency of Communication with Friends and Family: Twice a week  Frequency of Social Gatherings with Friends and Family: Twice a week    Attends Religious Services: Patient declined    Database administrator or Organizations: No    Attends Engineer, structural: Not on file    Marital Status: Living with partner    Family History: Family History  Problem Relation Age of Onset   Diabetes Maternal Grandmother    Diabetes Maternal Grandfather    Hypertension Father    Hypercholesterolemia Father    Hypertension Mother    Hypercholesterolemia Mother    Schizophrenia Brother    Hypertension Other    Diabetes Other     Allergies: Allergies  Allergen Reactions   Penicillins Shortness Of Breath and Rash   Naproxen Nausea And Vomiting    Medications Prior to Admission  Medication Sig Dispense Refill Last Dose/Taking   Acetaminophen (TYLENOL PO) Take by mouth.   10/05/2023   diphenhydrAMINE (BENADRYL) 25 mg  capsule Take 25 mg by mouth every 6 (six) hours as needed.   Past Week   Magnesium Glycinate 120 MG CAPS Take 3 tablets by mouth daily. 90 capsule 5 10/05/2023   ondansetron (ZOFRAN-ODT) 4 MG disintegrating tablet Take 1 tablet (4 mg total) by mouth every 8 (eight) hours as needed for nausea or vomiting. 20 tablet 0 10/05/2023   Prenatal Vit-Fe Fumarate-FA (PRENATAL VITAMIN PO) Take by mouth.   10/05/2023   Blood Pressure Monitor MISC For regular home bp monitoring during pregnancy 1 each 0    fluticasone (FLONASE) 50 MCG/ACT nasal spray Place 2 sprays into both nostrils daily. (Patient not taking: Reported on 08/14/2023) 16 g 2 More than a month     Review of Systems   All systems reviewed and negative except as stated in HPI  Blood pressure (!) 142/72, pulse 61, temperature 97.8 F (36.6 C), temperature source Oral, last menstrual period 01/15/2023. General appearance: alert and no distress Lungs: clear to auscultation bilaterally Heart: regular rate and rhythm Abdomen: soft, non-tender; bowel sounds normal Pelvic: normal female anatomy Extremities: Homans sign is negative, no sign of DVT Presentation: cephalic Fetal monitoring:Baseline: 135 bpm, Variability: Good {> 6 bpm), and Accelerations: Reactive Uterine activityNone Dilation: 5 Effacement (%): 60 Station: -2 Exam by:: Dr. Earlene Plater   Prenatal labs: ABO, Rh: --/--/PENDING (03/09 0310) Antibody: PENDING (03/09 0310) Rubella: 5.25 (09/18 1505) RPR: Non Reactive (01/02 0821)  HBsAg: Negative (09/18 1505)  HIV: Non Reactive (01/02 0821)  GBS: --Theda Sers (02/27 1600)   No results found for: "GBS" GTT: 89/158/133  Genetic screening - Female Anatomy US - mild left renal pelvic dilatation 6.6 mm, right renal pelvis 3 mm WNL  Immunization History  Administered Date(s) Administered   Tdap 04/23/2022, 08/14/2023    Prenatal Transfer Tool  Maternal Diabetes: No Genetic Screening: Normal Maternal Ultrasounds/Referrals: Other: mild  left renal pelvic dilatation 6.6 mm Fetal Ultrasounds or other Referrals:  None Maternal Substance Abuse:  No Significant Maternal Medications:  None Significant Maternal Lab Results: Group B Strep negative Number of Prenatal Visits:greater than 3 verified prenatal visits Maternal Vaccinations:TDap Other Comments:  None   Results for orders placed or performed during the hospital encounter of 10/06/23 (from the past 24 hours)  Type and screen [facility]   Collection Time: 10/06/23  3:10 AM  Result Value Ref Range   ABO/RH(D) PENDING    Antibody Screen PENDING    Sample Expiration      10/09/2023,2359 Performed at [facility], [address] [place],  [place] [address]   CBC   Collection Time: 10/06/23  3:12 AM  Result Value Ref Range   WBC 11.2 (H) 4.0 - 10.5 K/uL   RBC 4.01 3.87 - 5.11 MIL/uL   Hemoglobin 12.0 12.0 - 15.0 g/dL   HCT 60.4 (L) 54.0 - 98.1 %   MCV 86.3 80.0 - 100.0 fL   MCH 29.9 26.0 - 34.0 pg   MCHC 34.7 30.0 - 36.0 g/dL   RDW 19.1 47.8 - 29.5 %   Platelets 185 150 - 400 K/uL   nRBC 0.0 0.0 - 0.2 %    Patient Active Problem List   Diagnosis Date Noted   Gestational hypertension 10/06/2023   Viral illness 08/05/2023   Restless leg syndrome 06/05/2023   Encounter for supervision of normal pregnancy, antepartum 04/17/2023   History of seizure disorder 04/17/2023   Obesity (BMI 35.0-39.9 without comorbidity) 10/19/2022   Depression 01/24/2022    Assessment/Plan:  Julie Case is a 33 y.o. [id number] at [redacted]w[redacted]d here for IOL for GHTN.  #Labor: AROM 0324, pitocin prn #Pain: Epidural #FWB: Cat I #GBS status: negative #Feeding: Breastmilk  #Reproductive Life planning: Tubal Ligation - single L tube. Consents signed  #gHTN: CMP, P:C pending. No s/sx of preE at this time. Lasix + K PP  Fortunato Curling, DO  10/06/2023, 3:47 AM  Attestation of Supervision of Student:  I confirm that I have verified the information documented in the  resident's note and that I have also personally performed the history, physical exam and all medical decision making activities.  I have verified that all services and findings are accurately documented in this student's note; and I agree with management and plan as outlined in the documentation. I have also made any necessary editorial changes.  R/B/A of AROM discussed with patient, and verbal consent obtained. Babe's head found to be well-applied. Obtained  small amount of  clear fluid. Mom and babe tolerated well.  Joanne Gavel, MD OB Fellow 10/06/2023 3:52 AM

## 2023-10-07 ENCOUNTER — Encounter (HOSPITAL_COMMUNITY)
Admission: RE | Disposition: A | Discharge status: Home / Self Care | Payer: Self-pay | Source: Home / Self Care | Attending: Obstetrics & Gynecology

## 2023-10-07 ENCOUNTER — Inpatient Hospital Stay (HOSPITAL_COMMUNITY): Admitting: Anesthesiology

## 2023-10-07 ENCOUNTER — Encounter (HOSPITAL_COMMUNITY): Payer: Self-pay | Admitting: Family Medicine

## 2023-10-07 ENCOUNTER — Encounter (HOSPITAL_COMMUNITY): Payer: Self-pay | Admitting: Anesthesiology

## 2023-10-07 DIAGNOSIS — F17210 Nicotine dependence, cigarettes, uncomplicated: Secondary | ICD-10-CM | POA: Diagnosis not present

## 2023-10-07 DIAGNOSIS — F32A Depression, unspecified: Secondary | ICD-10-CM

## 2023-10-07 DIAGNOSIS — I10 Essential (primary) hypertension: Secondary | ICD-10-CM

## 2023-10-07 DIAGNOSIS — Z302 Encounter for sterilization: Secondary | ICD-10-CM | POA: Diagnosis not present

## 2023-10-07 DIAGNOSIS — Z9851 Tubal ligation status: Secondary | ICD-10-CM

## 2023-10-07 HISTORY — PX: TUBAL LIGATION: SHX77

## 2023-10-07 LAB — BIRTH TISSUE RECOVERY COLLECTION (PLACENTA DONATION)

## 2023-10-07 SURGERY — LIGATION, FALLOPIAN TUBE, POSTPARTUM
Anesthesia: Spinal

## 2023-10-07 MED ORDER — FENTANYL CITRATE (PF) 100 MCG/2ML IJ SOLN
INTRAMUSCULAR | Status: DC | PRN
  Administered 2023-10-07: 15 ug via INTRATHECAL

## 2023-10-07 MED ORDER — ONDANSETRON HCL 4 MG/2ML IJ SOLN
INTRAMUSCULAR | Status: DC | PRN
  Administered 2023-10-07: 4 mg via INTRAVENOUS

## 2023-10-07 MED ORDER — ONDANSETRON HCL 4 MG/2ML IJ SOLN
INTRAMUSCULAR | Status: AC
  Filled 2023-10-07: qty 2

## 2023-10-07 MED ORDER — LACTATED RINGERS IV SOLN: INTRAVENOUS | Status: AC

## 2023-10-07 MED ORDER — MIDAZOLAM HCL 5 MG/5ML IJ SOLN
INTRAMUSCULAR | Status: DC | PRN
Start: 2023-10-07 — End: 2023-10-07
  Administered 2023-10-07: 1 mg via INTRAVENOUS

## 2023-10-07 MED ORDER — FENTANYL CITRATE (PF) 100 MCG/2ML IJ SOLN
INTRAMUSCULAR | Status: DC | PRN
  Administered 2023-10-07: 35 ug via INTRAVENOUS
  Administered 2023-10-07: 50 ug via INTRAVENOUS

## 2023-10-07 MED ORDER — LACTATED RINGERS IV SOLN: INTRAVENOUS | Status: DC

## 2023-10-07 MED ORDER — KETOROLAC TROMETHAMINE 30 MG/ML IJ SOLN
INTRAMUSCULAR | Status: DC | PRN
  Administered 2023-10-07: 30 mg via INTRAVENOUS

## 2023-10-07 MED ORDER — DEXAMETHASONE SODIUM PHOSPHATE 10 MG/ML IJ SOLN
INTRAMUSCULAR | Status: AC
Start: 2023-10-07 — End: ?
  Filled 2023-10-07: qty 1

## 2023-10-07 MED ORDER — METOCLOPRAMIDE HCL 10 MG PO TABS
10.0000 mg | ORAL_TABLET | Freq: Once | ORAL | Status: AC
  Administered 2023-10-07: 10 mg via ORAL
  Filled 2023-10-07: qty 1

## 2023-10-07 MED ORDER — BUPIVACAINE HCL (PF) 0.25 % IJ SOLN
INTRAMUSCULAR | Status: AC
  Filled 2023-10-07: qty 20

## 2023-10-07 MED ORDER — BUPIVACAINE HCL (PF) 0.25 % IJ SOLN
INTRAMUSCULAR | Status: DC | PRN
  Administered 2023-10-07: 20 mL

## 2023-10-07 MED ORDER — FENTANYL CITRATE (PF) 100 MCG/2ML IJ SOLN
INTRAMUSCULAR | Status: AC
Start: 2023-10-07 — End: ?
  Filled 2023-10-07: qty 2

## 2023-10-07 MED ORDER — MIDAZOLAM HCL 2 MG/2ML IJ SOLN
INTRAMUSCULAR | Status: AC
  Filled 2023-10-07: qty 2

## 2023-10-07 MED ORDER — FAMOTIDINE 20 MG PO TABS
40.0000 mg | ORAL_TABLET | Freq: Once | ORAL | Status: AC
  Administered 2023-10-07: 40 mg via ORAL
  Filled 2023-10-07: qty 2

## 2023-10-07 MED ORDER — LIDOCAINE-EPINEPHRINE (PF) 2 %-1:200000 IJ SOLN
INTRAMUSCULAR | Status: AC
  Filled 2023-10-07: qty 20

## 2023-10-07 MED FILL — Oxytocin-Sodium Chloride 0.9% IV Soln 30 Unit/500ML: INTRAVENOUS | Qty: 500 | Status: AC

## 2023-10-07 SURGICAL SUPPLY — 21 items
BLADE SURG 11 STRL SS (BLADE) ×1 IMPLANT
CLIP FILSHIE TUBAL LIGA STRL (Clip) ×1 IMPLANT
DERMABOND ADVANCED .7 DNX12 (GAUZE/BANDAGES/DRESSINGS) IMPLANT
DISSECTOR SURG LIGASURE 21 (MISCELLANEOUS) IMPLANT
DRSG OPSITE POSTOP 3X4 (GAUZE/BANDAGES/DRESSINGS) ×1 IMPLANT
DURAPREP 26ML APPLICATOR (WOUND CARE) ×1 IMPLANT
GLOVE BIOGEL PI IND STRL 7.0 (GLOVE) ×1 IMPLANT
GLOVE BIOGEL PI IND STRL 7.5 (GLOVE) ×1 IMPLANT
GLOVE ECLIPSE 7.5 STRL STRAW (GLOVE) ×1 IMPLANT
GOWN STRL REUS W/TWL LRG LVL3 (GOWN DISPOSABLE) ×2 IMPLANT
HIBICLENS CHG 4% 4OZ BTL (MISCELLANEOUS) ×1 IMPLANT
NEEDLE HYPO 22GX1.5 SAFETY (NEEDLE) ×1 IMPLANT
NS IRRIG 1000ML POUR BTL (IV SOLUTION) ×1 IMPLANT
PACK ABDOMINAL MINOR (CUSTOM PROCEDURE TRAY) ×1 IMPLANT
PROTECTOR NERVE ULNAR (MISCELLANEOUS) ×1 IMPLANT
SPONGE LAP 4X18 RFD (DISPOSABLE) IMPLANT
SUT VICRYL 0 UR6 27IN ABS (SUTURE) ×1 IMPLANT
SUT VICRYL 4-0 PS2 18IN ABS (SUTURE) ×1 IMPLANT
SYR CONTROL 10ML LL (SYRINGE) ×1 IMPLANT
TOWEL OR 17X24 6PK STRL BLUE (TOWEL DISPOSABLE) ×2 IMPLANT
TRAY FOLEY W/BAG SLVR 14FR (SET/KITS/TRAYS/PACK) ×1 IMPLANT

## 2023-10-07 NOTE — Social Work (Signed)
 CSW acknowledged consult and attempted to meet with MOB. However, MOB was being transported for tubal ligation. CSW will meet with MOB at a later time.  Wende Neighbors, LCSWA Clinical Social Worker [phone]

## 2023-10-07 NOTE — Progress Notes (Signed)
 POSTPARTUM PROGRESS NOTE  Subjective: Julie Case is a 33 y.o. [id number] s/p NSVD at [redacted]w[redacted]d for GHTN.  She reports she doing well. No acute events overnight. She denies any problems with ambulating, voiding or po intake. Denies nausea or vomiting. She has passed flatus. Pain is well controlled.  Lochia is moderate.  Objective: Blood pressure 126/78, pulse 69, temperature 98.1 F (36.7 C), temperature source Oral, resp. rate 16, last menstrual period 01/15/2023, SpO2 98%, unknown if currently breastfeeding.  Physical Exam:  General: alert, cooperative and no distress Chest: no respiratory distress Abdomen: soft, non-tender  Uterine Fundus: firm and at level of umbilicus Extremities: No calf swelling or tenderness  No edema  Recent Labs    10/06/23 0312  HGB 12.0  HCT 34.6*    Assessment/Plan: Julie Case is a 33 y.o. [id number] s/p NSVD at [redacted]w[redacted]d for GHTN.  Routine Postpartum Care: Doing well, pain well-controlled.  -- Continue routine care, lactation support  -- Contraception: Tubal ligation today  -- Feeding: formula  Dispo: Plan for discharge tomorrow following tubal ligation.  Arlyce Dice, [employer], [facility] [employer] 10/07/2023 7:27 AM

## 2023-10-07 NOTE — Transfer of Care (Signed)
 Immediate Anesthesia Transfer of Care Note  Patient: Julie Case  Procedure(s) Performed: LIGATION, FALLOPIAN TUBE, POSTPARTUM  Patient Location: PACU  Anesthesia Type:Spinal  Level of Consciousness: awake, alert , and oriented  Airway & Oxygen Therapy: Patient Spontanous Breathing  Post-op Assessment: Report given to RN and Post -op Vital signs reviewed and stable  Post vital signs: Reviewed and stable  Last Vitals:  Vitals Value Taken Time  BP 114/64 10/07/23 1516  Temp    Pulse 56 10/07/23 1518  Resp 17 10/07/23 1518  SpO2 94 % 10/07/23 1518  Vitals shown include unfiled device data.  Last Pain:  Vitals:   10/07/23 1117  TempSrc:   PainSc: 2          Complications: No notable events documented.

## 2023-10-07 NOTE — Anesthesia Procedure Notes (Signed)
 Spinal  Patient location during procedure: OR Start time: 10/07/2023 2:23 PM End time: 10/07/2023 2:26 PM Reason for block: surgical anesthesia Staffing Other anesthesia staff: Brooks Sailors, RN Performed by: Molli Hazard, CRNA Authorized by: Atilano Median, DO   Preanesthetic Checklist Completed: patient identified, IV checked, site marked, risks and benefits discussed, surgical consent, monitors and equipment checked, pre-op evaluation and timeout performed Spinal Block Patient position: sitting Prep: DuraPrep Patient monitoring: heart rate, cardiac monitor, continuous pulse ox and blood pressure Approach: midline Location: L3-4 Injection technique: single-shot Needle Needle type: Sprotte  Needle gauge: 24 G Needle length: 9 cm Assessment Sensory level: T4 Events: CSF return

## 2023-10-07 NOTE — Anesthesia Preprocedure Evaluation (Signed)
 Anesthesia Evaluation  Patient identified by MRN, date of birth, ID band Patient awake    Reviewed: Allergy & Precautions, Patient's Chart, lab work & pertinent test results  Airway Mallampati: III  TM Distance: >3 FB Neck ROM: Full    Dental no notable dental hx.    Pulmonary Current Smoker   Pulmonary exam normal breath sounds clear to auscultation       Cardiovascular hypertension, Normal cardiovascular exam Rhythm:Regular Rate:Normal     Neuro/Psych Seizures -, Well Controlled,  PSYCHIATRIC DISORDERS  Depression       GI/Hepatic negative GI ROS, Neg liver ROS,,,  Endo/Other  BMI 36  Renal/GU negative Renal ROS  negative genitourinary   Musculoskeletal negative musculoskeletal ROS (+)    Abdominal  (+) + obese  Peds negative pediatric ROS (+)  Hematology negative hematology ROS (+) Lab Results      Component                Value               Date                      WBC                      11.2 (H)            10/06/2023                HGB                      12.0                10/06/2023                HCT                      34.6 (L)            10/06/2023                MCV                      86.3                10/06/2023                PLT                      185                 10/06/2023              Anesthesia Other Findings   Reproductive/Obstetrics (+) Pregnancy                             Anesthesia Physical Anesthesia Plan  ASA: 3  Anesthesia Plan: Spinal   Post-op Pain Management:    Induction:   PONV Risk Score and Plan: 2 and Ondansetron, Dexamethasone and Treatment may vary due to age or medical condition  Airway Management Planned: Natural Airway  Additional Equipment: None  Intra-op Plan:   Post-operative Plan:   Informed Consent: I have reviewed the patients History and Physical, chart, labs and discussed the procedure including the risks,  benefits and alternatives for the proposed anesthesia with the patient or authorized representative who has indicated  his/her understanding and acceptance.     Dental advisory given  Plan Discussed with: CRNA  Anesthesia Plan Comments:        Anesthesia Quick Evaluation

## 2023-10-08 ENCOUNTER — Other Ambulatory Visit (HOSPITAL_COMMUNITY): Payer: Self-pay

## 2023-10-08 LAB — CBC WITH DIFFERENTIAL/PLATELET
Abs Immature Granulocytes: 0.07 10*3/uL (ref 0.00–0.07)
Basophils Absolute: 0.1 10*3/uL (ref 0.0–0.1)
Basophils Relative: 1 %
Eosinophils Absolute: 0.4 10*3/uL (ref 0.0–0.5)
Eosinophils Relative: 4 %
HCT: 33.4 % — ABNORMAL LOW (ref 36.0–46.0)
Hemoglobin: 11.8 g/dL — ABNORMAL LOW (ref 12.0–15.0)
Immature Granulocytes: 1 %
Lymphocytes Relative: 22 %
Lymphs Abs: 2.4 10*3/uL (ref 0.7–4.0)
MCH: 29.8 pg (ref 26.0–34.0)
MCHC: 35.3 g/dL (ref 30.0–36.0)
MCV: 84.3 fL (ref 80.0–100.0)
Monocytes Absolute: 0.8 10*3/uL (ref 0.1–1.0)
Monocytes Relative: 8 %
Neutro Abs: 7.2 10*3/uL (ref 1.7–7.7)
Neutrophils Relative %: 64 %
Platelets: 188 10*3/uL (ref 150–400)
RBC: 3.96 MIL/uL (ref 3.87–5.11)
RDW: 14.6 % (ref 11.5–15.5)
WBC: 10.9 10*3/uL — ABNORMAL HIGH (ref 4.0–10.5)
nRBC: 0 % (ref 0.0–0.2)

## 2023-10-08 MED ORDER — ACETAMINOPHEN 500 MG PO TABS
1000.0000 mg | ORAL_TABLET | Freq: Three times a day (TID) | ORAL | 0 refills | Status: DC
  Filled 2023-10-08: qty 30, 5d supply, fill #0

## 2023-10-08 NOTE — Progress Notes (Signed)
 MOB desires to know when to take off her small honeycomb dressing covering the tubal incision site.
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
## 2023-10-08 NOTE — Anesthesia Postprocedure Evaluation (Signed)
 Anesthesia Post Note  Patient: Alethia Melendrez  Procedure(s) Performed: LIGATION, FALLOPIAN TUBE, POSTPARTUM     Patient location during evaluation: PACU Anesthesia Type: Spinal Level of consciousness: oriented and awake and alert Pain management: pain level controlled Vital Signs Assessment: post-procedure vital signs reviewed and stable Respiratory status: spontaneous breathing, respiratory function stable and patient connected to nasal cannula oxygen Cardiovascular status: blood pressure returned to baseline and stable Postop Assessment: no headache, no backache and no apparent nausea or vomiting Anesthetic complications: no   No notable events documented.  Last Vitals:  Vitals:   10/08/23 0047 10/08/23 0332  BP: 133/71 128/73  Pulse: 66 61  Resp:    Temp: 36.6 C 36.9 C  SpO2: 100% 98%    Last Pain:  Vitals:   10/08/23 0728  TempSrc:   PainSc: 0-No pain                 Nelle Don Donyale Falcon

## 2023-10-09 LAB — SURGICAL PATHOLOGY

## 2023-10-10 ENCOUNTER — Encounter: Payer: Medicaid Other | Admitting: Women's Health

## 2023-10-11 NOTE — Op Note (Signed)
 Julie Case 10/07/2023  PREOPERATIVE DIAGNOSIS:  Undesired fertility  POSTOPERATIVE DIAGNOSIS:  Undesired fertility  PROCEDURE:  Postpartum Bilateral Tubal Sterilization using Ligasure   SURGEON:  Dr Candelaria Celeste  ASSISTANT: Dr Wyn Forster  ANESTHESIA:  Epidural  COMPLICATIONS:  None immediate.  ESTIMATED BLOOD LOSS:  Less than 20cc.  FLUIDS: 1500 mL LR.  URINE OUTPUT:  1050 mL of clear urine.  INDICATIONS: 33 y.o. yo [id number]  with undesired fertility,status post vaginal delivery, desires permanent sterilization. Risks and benefits of procedure discussed with patient including permanence of method, bleeding, infection, injury to surrounding organs and need for additional procedures. Risk failure of 0.5-1% with increased risk of ectopic gestation if pregnancy occurs was also discussed with patient.   FINDINGS:  Normal uterus, tubes, and ovaries.  TECHNIQUE:  The patient was taken to the operating room where her epidural anesthesia was dosed up to surgical level and found to be adequate.  She was then placed in the dorsal supine position and prepped and draped in sterile fashion.  After an adequate timeout was performed, attention was turned to the patient's abdomen where a small transverse skin incision was made under the umbilical fold. The incision was taken down to the layer of fascia using the scalpel, and fascia was incised, and extended bilaterally using Mayo scissors. The peritoneum was entered in a sharp fashion.   Attention was then turned to the patient's adnexa. The left fallopian tube was surgically absent. The right adnexa was then identified and followed out to the fimbriated end.  Ligasure device was used to cauterize and cut the mesosalpinx to proximal end of the fallopian tube, removing 6cm of tube.     Good hemostasis was noted overall.The instruments were then removed from the patient's abdomen and the fascial incision was repaired with 0 Vicryl, and the skin was  closed with a 3-0 Monocryl subcuticular stitch. The patient tolerated the procedure well.  Sponge, lap, and needle counts were correct times two.  The patient was then taken to the recovery room awake, extubated and in stable condition.   [name], DO 10/11/2023 10:50 AM

## 2023-10-14 ENCOUNTER — Other Ambulatory Visit: Payer: Self-pay | Admitting: Obstetrics & Gynecology

## 2023-10-14 ENCOUNTER — Ambulatory Visit: Admitting: *Deleted

## 2023-10-14 VITALS — BP 141/97 | HR 86

## 2023-10-14 DIAGNOSIS — O133 Gestational [pregnancy-induced] hypertension without significant proteinuria, third trimester: Secondary | ICD-10-CM

## 2023-10-14 DIAGNOSIS — Z8759 Personal history of other complications of pregnancy, childbirth and the puerperium: Secondary | ICD-10-CM

## 2023-10-14 MED ORDER — NIFEDIPINE ER OSMOTIC RELEASE 30 MG PO TB24: 30.0000 mg | ORAL_TABLET | Freq: Every day | ORAL | 0 refills | Status: DC

## 2023-10-14 MED ORDER — FUROSEMIDE 20 MG PO TABS: 20.0000 mg | ORAL_TABLET | Freq: Every day | ORAL | 0 refills | Status: DC

## 2023-10-14 NOTE — Progress Notes (Signed)
 Rx sent in for Lasix and Procardia See RN note  Myna Hidalgo, DO Attending Obstetrician & Gynecologist, [facility] for [employer], [facility]

## 2023-10-14 NOTE — Progress Notes (Signed)
   NURSE VISIT- BLOOD PRESSURE CHECK  SUBJECTIVE:  Julie Case is a 33 y.o. [phone] female here for BP check. She is postpartum, delivery date 10/07/23     HYPERTENSION ROS:  Postpartum:  Severe headaches that don't go away with tylenol/other medicines: Yes  Visual changes (seeing spots/double/blurred vision) No  Severe pain under right breast breast or in center of upper chest No  Severe nausea/vomiting No  Taking medicines as instructed not applicable   OBJECTIVE:  BP (!) 141/97 (BP Location: Right Arm, Patient Position: Sitting, Cuff Size: Normal)   Pulse 86   LMP 01/15/2023 (Exact Date)   Breastfeeding No   Appearance alert, well appearing, and in no distress.  ASSESSMENT: Postpartum  blood pressure check  PLAN: Discussed with Dr. Charlotta Newton   Recommendations: new prescription will be sent   Follow-up:  1 week bp check    Jobe Marker  10/14/2023 1:56 PM

## 2023-10-17 ENCOUNTER — Encounter: Payer: Medicaid Other | Admitting: Women's Health

## 2023-10-21 ENCOUNTER — Telehealth: Admitting: *Deleted

## 2023-10-21 VITALS — BP 147/100

## 2023-10-21 DIAGNOSIS — Z8759 Personal history of other complications of pregnancy, childbirth and the puerperium: Secondary | ICD-10-CM

## 2023-10-21 NOTE — Progress Notes (Signed)
   NURSE VISIT- BLOOD PRESSURE CHECK  I connected with [name] on 10/21/2023 by MyChart video and verified that I am speaking with the correct person using two identifiers.   I discussed the limitations of evaluation and management by telemedicine. The patient expressed understanding and agreed to proceed.  Nurse is at the office, and patient is at home.  SUBJECTIVE:  Julie Case is a 33 y.o. [phone] female here for BP check. She is postpartum, delivery date 10/07/23     HYPERTENSION ROS:  Postpartum::  Severe headaches that don't go away with tylenol/other medicines: No  Visual changes (seeing spots/double/blurred vision) No  Severe pain under right breast breast or in center of upper chest No  Severe nausea/vomiting No  Taking medicines as instructed yes  OBJECTIVE:  BP (!) 147/100 (BP Location: Left Arm)   LMP 01/15/2023 (Exact Date)   Breastfeeding No   Appearance alert, well appearing, and in no distress.  ASSESSMENT: Postpartum  blood pressure check  PLAN: Discussed with Dr. Charlotta Newton   Recommendations: no changes needed   Follow-up:  pt to continue taking Procardia and check bp in the evenings until later this week.  Send mychart message with readings and will decide then if mediation needs to be increased.     Jobe Marker  10/21/2023 2:43 PM

## 2023-11-11 ENCOUNTER — Encounter: Payer: Self-pay | Admitting: Obstetrics and Gynecology

## 2023-11-11 ENCOUNTER — Ambulatory Visit: Admitting: Obstetrics and Gynecology

## 2023-11-11 DIAGNOSIS — O133 Gestational [pregnancy-induced] hypertension without significant proteinuria, third trimester: Secondary | ICD-10-CM

## 2023-11-11 DIAGNOSIS — O139 Gestational [pregnancy-induced] hypertension without significant proteinuria, unspecified trimester: Secondary | ICD-10-CM

## 2023-11-11 DIAGNOSIS — Z9851 Tubal ligation status: Secondary | ICD-10-CM | POA: Diagnosis not present

## 2023-11-11 IMAGING — DX DG KNEE COMPLETE 4+V*R*
4 series · 4 of 4 positions shown · non-contrast
Comparison: None.

CLINICAL DATA: Status post fall with knee pain.

EXAM:
RIGHT KNEE - COMPLETE 4+ VIEW

[knee ap]
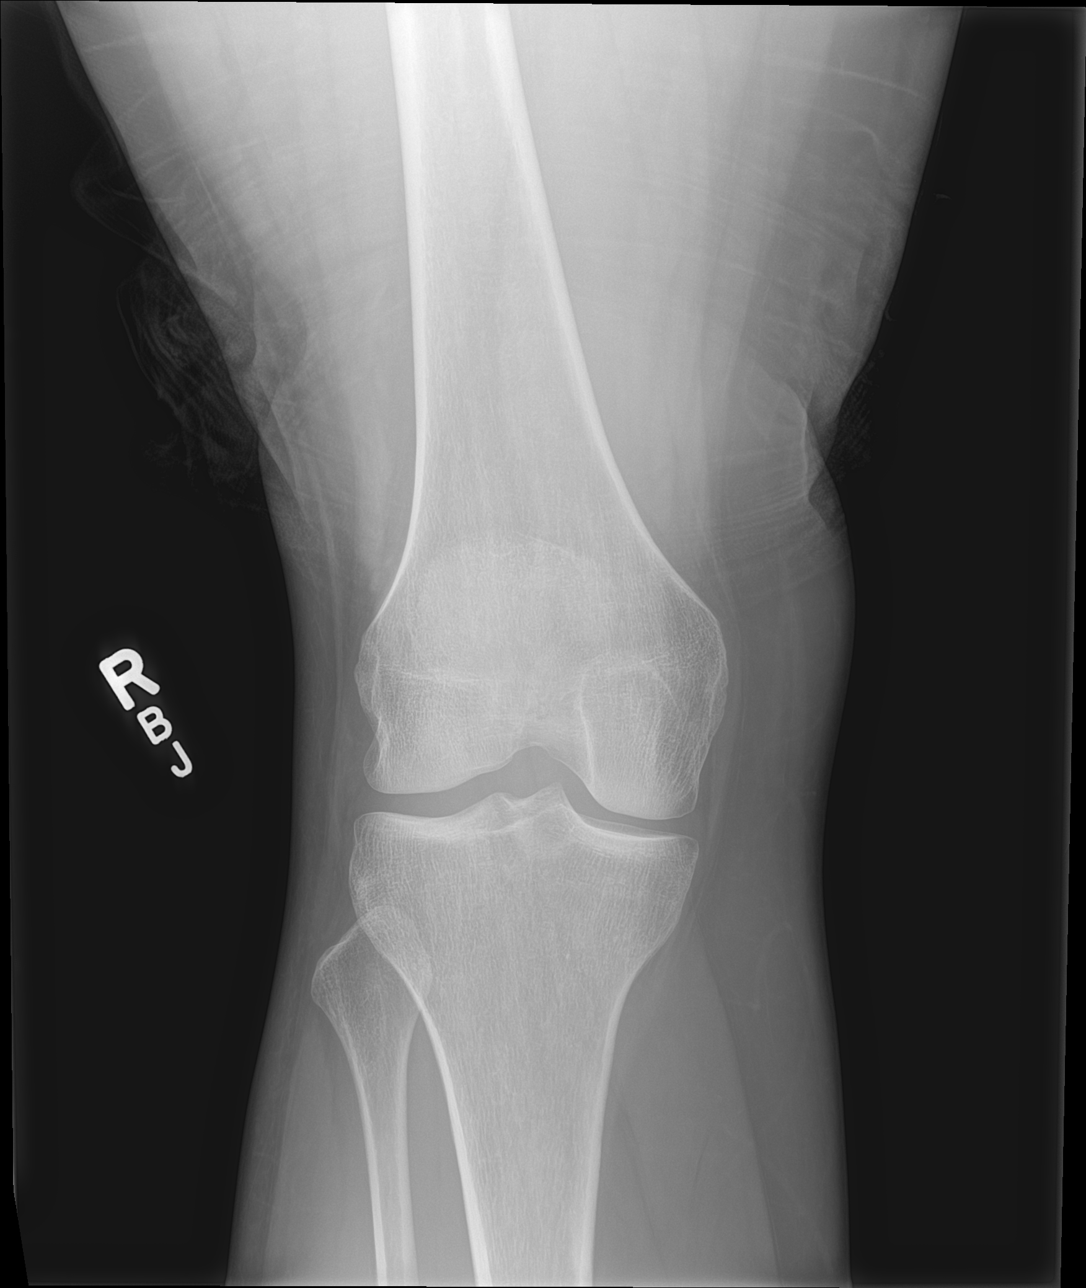

[knee obl (1 of 2)]
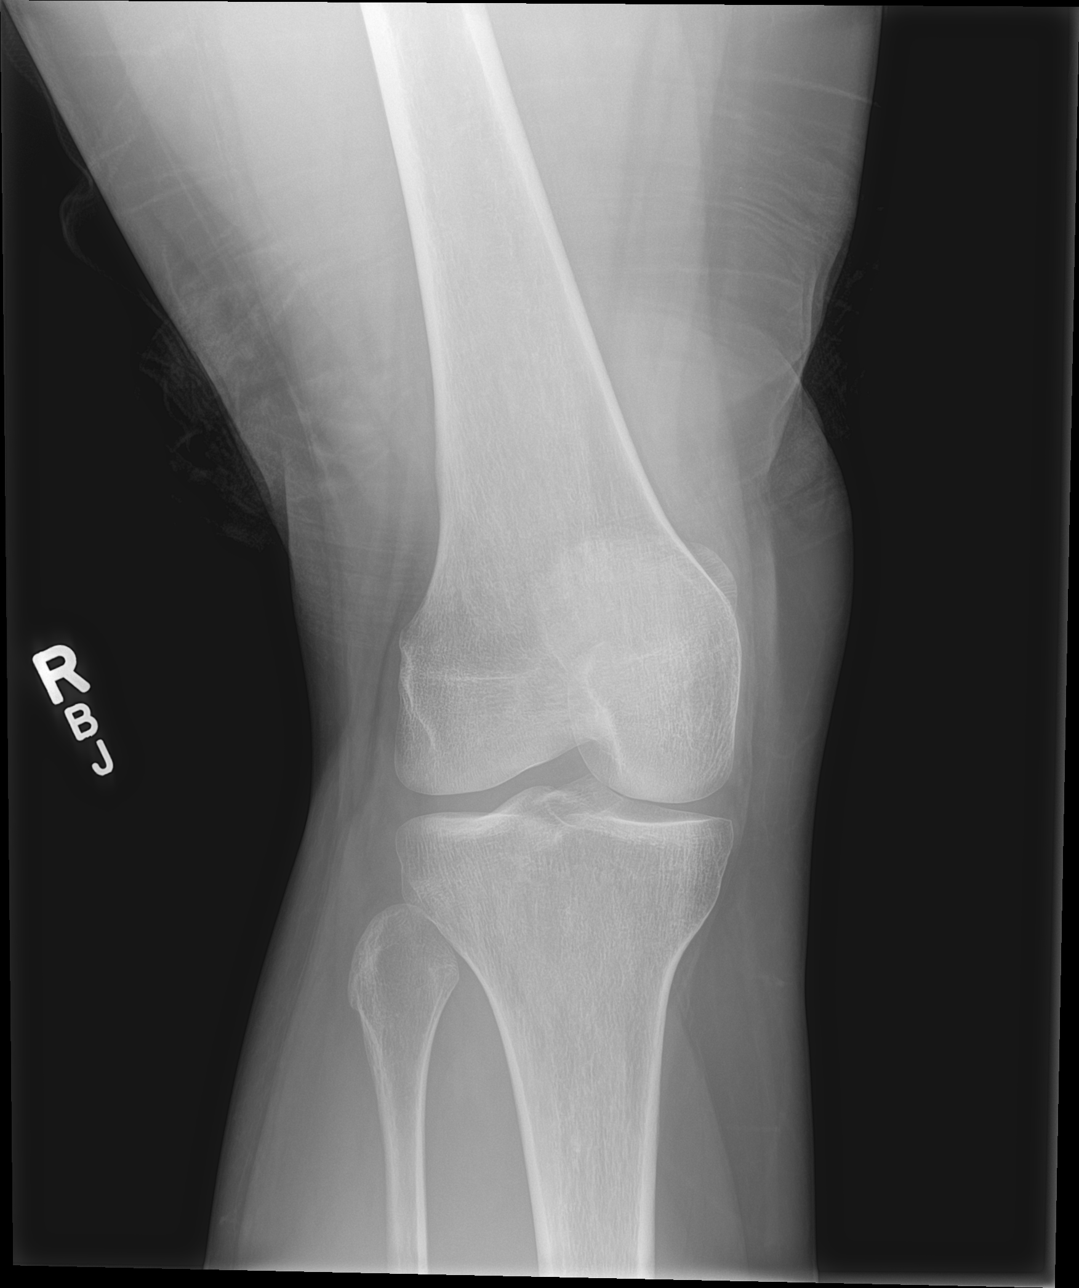

[knee obl (2 of 2)]
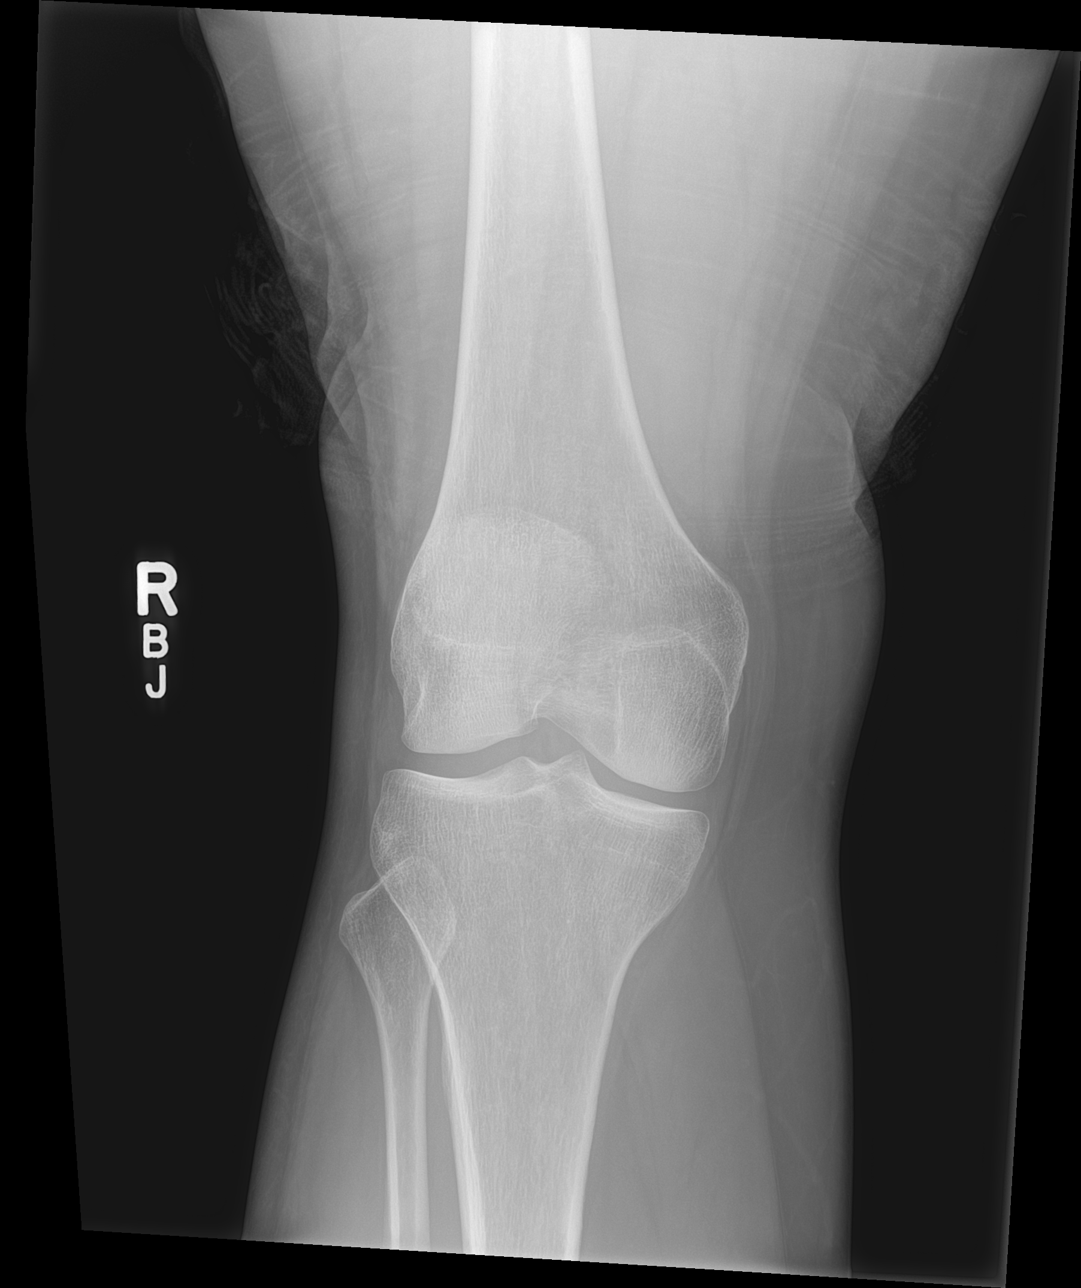

[knee lat]
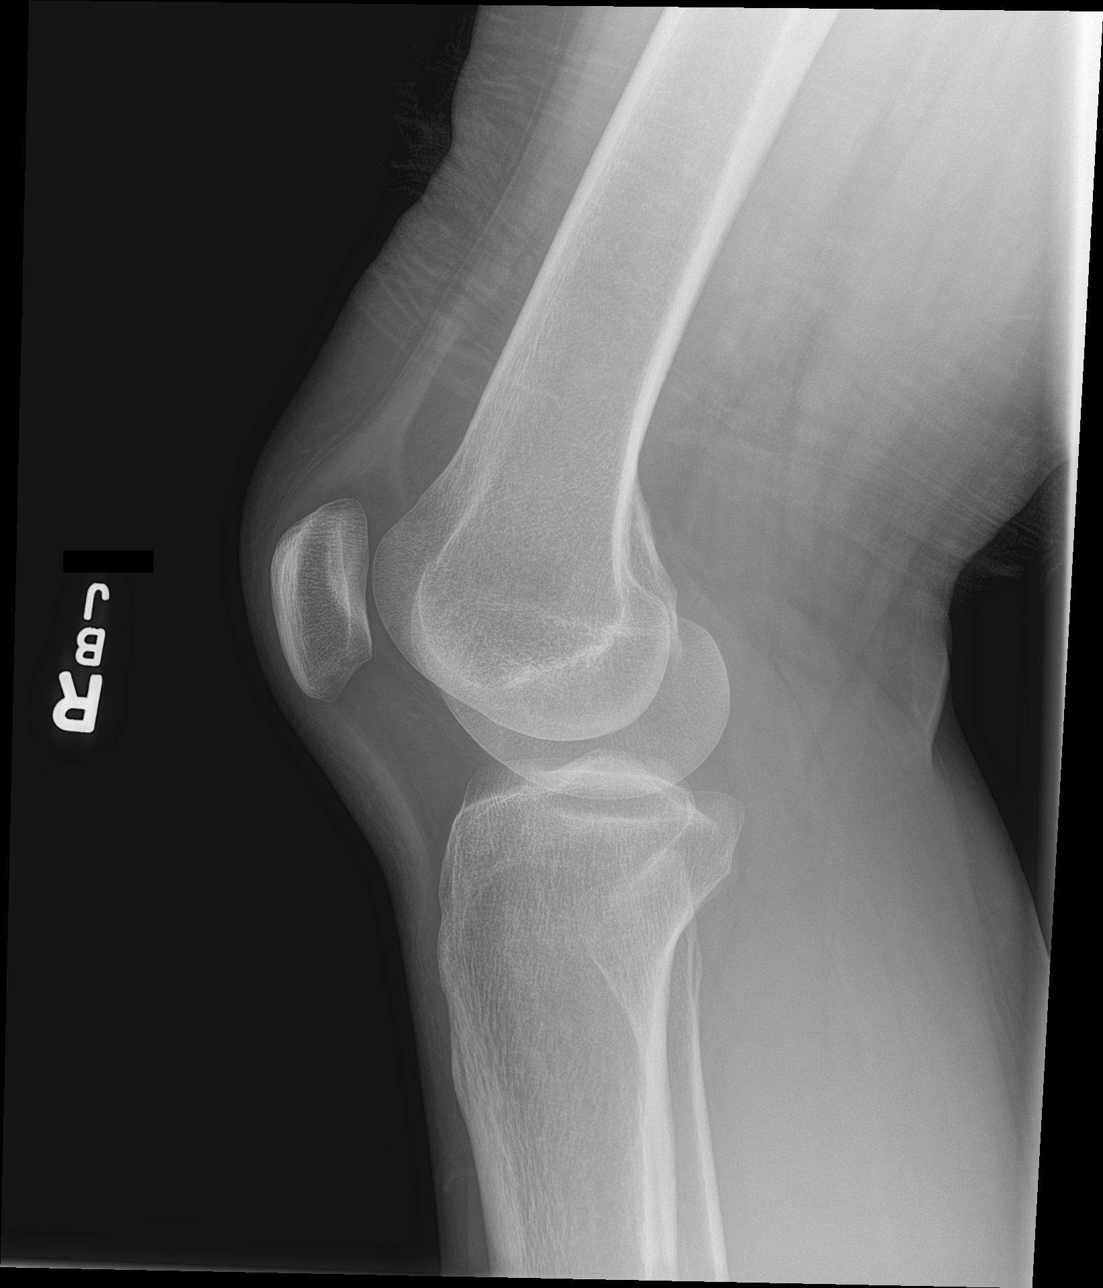

[4 of 4 positions shown; findings below may reference images not displayed]

FINDINGS: No evidence of fracture, dislocation, or joint effusion. No evidence
of arthropathy or other focal bone abnormality. Soft tissues are
unremarkable.
IMPRESSION: Negative.

## 2023-11-11 MED ORDER — NIFEDIPINE ER OSMOTIC RELEASE 30 MG PO TB24: 60.0000 mg | ORAL_TABLET | Freq: Every day | ORAL | 1 refills | Status: DC

## 2023-11-11 NOTE — Progress Notes (Signed)
 Post Partum Visit Note  Julie Case is a 33 y.o. [id number] female who presents for a postpartum visit. She is 5 weeks postpartum following a normal spontaneous vaginal delivery.  I have fully reviewed the prenatal and intrapartum course. The delivery was at 37.5 gestational weeks.  Anesthesia: general. Postpartum course has been good. Baby is doing well. Baby is feeding by bottle . Bleeding no bleeding. Bowel function is normal. Bladder function is normal. Patient is not sexually active. Contraception method is tubal ligation. Postpartum depression screening: negative.   The pregnancy intention screening data noted above was reviewed. Potential methods of contraception were discussed. The patient elected to proceed with No data recorded.   Edinburgh Postnatal Depression Scale - 11/11/23 0954       Edinburgh Postnatal Depression Scale:  In the Past 7 Days   I have been able to laugh and see the funny side of things. 0    I have looked forward with enjoyment to things. 0    I have blamed myself unnecessarily when things went wrong. 1    I have been anxious or worried for no good reason. 0    I have felt scared or panicky for no good reason. 0    Things have been getting on top of me. 0    I have been so unhappy that I have had difficulty sleeping. 0    I have felt sad or miserable. 0    I have been so unhappy that I have been crying. 0    The thought of harming myself has occurred to me. 0    Edinburgh Postnatal Depression Scale Total 1             Health Maintenance Due  Topic Date Due   COVID-19 Vaccine (1 - 2024-25 season) Never done    The following portions of the patient's history were reviewed and updated as appropriate: allergies, current medications, past family history, past medical history, past social history, past surgical history, and problem list.  Review of Systems Pertinent items are noted in HPI.  Objective:  BP (!) 137/91 (BP Location: Right Arm, Patient  Position: Sitting, Cuff Size: Large)   Pulse (!) 52   Ht 5\' 4"  (1.626 m)   Wt 204 lb (92.5 kg)   LMP 01/15/2023 (Exact Date)   Breastfeeding No   BMI 35.02 kg/m    General:  alert and cooperative   Breasts:  not indicated  Lungs: Normal effort  Wound N/a  GU exam:  not indicated       Assessment:   1. Postpartum exam (Primary)   2. Gestational hypertension, antepartum Currently on procardia  30 mg, reports home bp have improved but still 130s/90s. Elevated today x2, increase procardia to 60mg , will follow up BP check in one week   3. History of bilateral tubal ligation    Plan:   Essential components of care per ACOG recommendations:  1.  Mood and well being: Patient with negative depression screening today. Reviewed local resources for support.  - Patient tobacco use? No.     2. Infant care and feeding:  -Patient currently breastmilk feeding? No.  -Social determinants of health (SDOH) reviewed in EPIC. No concerns  3. Sexuality, contraception and birth spacing - Patient does not want a pregnancy in the next year.  Desired family size is 6 children.  - Reviewed reproductive life planning. Reviewed contraceptive methods based on pt preferences and effectiveness.  Patient desired Hormonal Implant  today.   - Discussed birth spacing of 18 months  4. Sleep and fatigue -Encouraged family/partner/community support of 4 hrs of uninterrupted sleep to help with mood and fatigue  5. Physical Recovery  - Discussed patients delivery and complications. She describes her labor as good. - Patient had a Vaginal, no problems at delivery. Patient had a  none  laceration. Perineal healing reviewed. Patient expressed understanding - Patient has urinary incontinence? No. - Patient is safe to resume physical and sexual activity  6.  Health Maintenance - HM due items addressed Yes - Last pap smear  Diagnosis  Date Value Ref Range Status  02/12/2022   Final   - Negative for  intraepithelial lesion or malignancy (NILM)   Pap smear not done at today's visit.  -Breast Cancer screening indicated? No.   7. Chronic Disease/Pregnancy Condition follow up: Hypertension  - PCP follow up   Susi Eric, FNP Center for [facility], [facility]

## 2023-11-25 ENCOUNTER — Ambulatory Visit: Payer: Self-pay

## 2023-11-25 ENCOUNTER — Ambulatory Visit: Admitting: *Deleted

## 2023-11-25 VITALS — BP 161/94 | HR 57

## 2023-11-25 DIAGNOSIS — Z0130 Encounter for examination of blood pressure without abnormal findings: Secondary | ICD-10-CM

## 2023-11-25 NOTE — Telephone Encounter (Signed)
 Noted.

## 2023-11-25 NOTE — Telephone Encounter (Signed)
 Chief Complaint: Back pain Symptoms: Back pain  Frequency: 4 days Pertinent Negatives: Patient denies blood in urine, pain with urination, pain/weakness/tingling in legs Disposition: [] ED /[] Urgent Care (no appt availability in office) / [x] Appointment(In office/virtual)/ []  [facility]/ [] Home Care/ [] Refused Recommended Disposition /[]  Mobile Bus/ []  Follow-up with PCP Additional Notes: Patient called in requesting an appt for evaluation of her back pain that started 4 days ago. Patient states it spreads across her lower back above the belt area. Patient is currently rating the pain a 7/10. Patient denies pain with urination, blood in urine, fever, pain radiating to legs or abdomen. Patient states Tylenol  has not been helping pain at all. Patient states pain is intermittent and controlled with heating pad. Patient appt made for tomorrow for further evaluation.    Copied from CRM [id number]. Topic: Clinical - Red Word Triage >> Nov 25, 2023  3:02 PM Opal Bill wrote: Red Word that prompted transfer to Nurse Triage: Really bad back pain for 4 days. Just had a baby about 8 weeks. Wants to come in and be seen.  Tylenol  is not working. Reason for Disposition  [1] MODERATE back pain (e.g., interferes with normal activities) AND [2] present > 3 days  Answer Assessment - Initial Assessment Questions 1. ONSET: "When did the pain begin?"      4 days ago 2. LOCATION: "Where does it hurt?" (upper, mid or lower back)     Everywhere on lower back above belt line 3. SEVERITY: "How bad is the pain?"  (e.g., Scale 1-10; mild, moderate, or severe)   - MILD (1-3): Doesn't interfere with normal activities.    - MODERATE (4-7): Interferes with normal activities or awakens from sleep.    - SEVERE (8-10): Excruciating pain, unable to do any normal activities.      7 4. PATTERN: "Is the pain constant?" (e.g., yes, no; constant, intermittent)      Comes and goes 5. RADIATION: "Does the pain  shoot into your legs or somewhere else?"     No 6. CAUSE:  "What do you think is causing the back pain?"      Unsure 7. BACK OVERUSE:  "Any recent lifting of heavy objects, strenuous work or exercise?"     No 8. MEDICINES: "What have you taken so far for the pain?" (e.g., nothing, acetaminophen , NSAIDS)     Tylenol   9. NEUROLOGIC SYMPTOMS: "Do you have any weakness, numbness, or problems with bowel/bladder control?"     No 10. OTHER SYMPTOMS: "Do you have any other symptoms?" (e.g., fever, abdomen pain, burning with urination, blood in urine)       No  Protocols used: Back Pain-A-AH

## 2023-11-25 NOTE — Progress Notes (Addendum)
   NURSE VISIT- BLOOD PRESSURE CHECK  SUBJECTIVE:  Julie Case is a 33 y.o. [phone] female here for BP check. She is postpartum, delivery date 3/9/ 25  HYPERTENSION ROS:  Pregnant/postpartum:  Severe headaches that don't go away with tylenol /other medicines: No  Visual changes (seeing spots/double/blurred vision) No  Severe pain under right breast breast or in center of upper chest No  Severe nausea/vomiting No  Taking medicines as instructed yes    OBJECTIVE:  BP (!) 161/94 (BP Location: Right Arm, Patient Position: Sitting, Cuff Size: Normal)   Pulse (!) 57   Breastfeeding No   Appearance alert, well appearing, and in no distress.  Initial BP: 147/93  ASSESSMENT: Postpartum  blood pressure check  PLAN: Discussed with Dr. Ozan   Recommendations: no changes needed   Follow-up:  1 week for bp check    Kerrie Peek  11/25/2023 2:38 PM

## 2023-11-26 ENCOUNTER — Ambulatory Visit (INDEPENDENT_AMBULATORY_CARE_PROVIDER_SITE_OTHER)

## 2023-11-26 VITALS — BP 122/83 | HR 100 | Ht 64.0 in | Wt 204.0 lb

## 2023-11-26 DIAGNOSIS — M545 Low back pain, unspecified: Secondary | ICD-10-CM

## 2023-11-26 MED ORDER — METHYLPREDNISOLONE 4 MG PO TBPK: ORAL_TABLET | ORAL | 0 refills | Status: DC

## 2023-11-26 MED ORDER — TIZANIDINE HCL 4 MG PO TABS: 2.0000 mg | ORAL_TABLET | Freq: Three times a day (TID) | ORAL | 0 refills | Status: DC | PRN

## 2023-11-26 NOTE — Progress Notes (Unsigned)
   Acute Office Visit  Subjective:     Patient ID: Julie Case, female    DOB: 1990/10/27, 33 y.o.   MRN: [id number]  Chief Complaint  Patient presents with   Medical Management of Chronic Issues    Pt states "Back pain for 5 days"    HPI Patient is in today for back pain.  Pain is worse on the left side. The patient is 6 weeks postpartum. She denies any hematuria, dysuria or frequent urination. Denies any paresthesia down either leg.  ROS      Objective:    BP 122/83   Pulse 100   Ht 5\' 4"  (1.626 m)   Wt 204 lb 0.6 oz (92.6 kg)   SpO2 97%   Breastfeeding No   BMI 35.02 kg/m    Physical Exam Vitals and nursing note reviewed. Exam conducted with a chaperone present (Patient's husband is with her today).  Constitutional:      Appearance: Normal appearance.  Eyes:     Extraocular Movements: Extraocular movements intact.     Pupils: Pupils are equal, round, and reactive to light.  Cardiovascular:     Rate and Rhythm: Normal rate and regular rhythm.  Pulmonary:     Effort: Pulmonary effort is normal.     Breath sounds: Normal breath sounds.  Abdominal:     General: Abdomen is protuberant. Bowel sounds are normal.     Tenderness: There is no abdominal tenderness. There is no right CVA tenderness or left CVA tenderness.  Musculoskeletal:     Cervical back: Normal, normal range of motion and neck supple.     Thoracic back: Normal.     Lumbar back: Spasms and tenderness (left side) present. Positive left straight leg raise test. Negative right straight leg raise test.  Neurological:     Mental Status: She is alert and oriented to person, place, and time.  Psychiatric:        Mood and Affect: Mood normal.        Thought Content: Thought content normal.     No results found for any visits on 11/26/23.      Assessment & Plan:   Problem List Items Addressed This Visit       Other   Left lumbar pain - Primary   No red flags on exam today.  Patient is unable to  take NSAIDs.  Will add steroid taper and muscle relaxer for pain relief.  Recommend soaking in Epsom salt bath and heating pad to affected area.  Recommend follow-up if no improvement in 5 to 7 days or sooner if symptoms worsen.      Relevant Medications   tiZANidine  (ZANAFLEX ) 4 MG tablet   methylPREDNISolone (MEDROL DOSEPAK) 4 MG TBPK tablet    Meds ordered this encounter  Medications   tiZANidine  (ZANAFLEX ) 4 MG tablet    Sig: Take 0.5-1 tablets (2-4 mg total) by mouth every 8 (eight) hours as needed for muscle spasms (back pain).    Dispense:  30 tablet    Refill:  0   methylPREDNISolone (MEDROL DOSEPAK) 4 MG TBPK tablet    Sig: Take as package instructions.    Dispense:  1 each    Refill:  0    No follow-ups on file.  Alison Irvine, FNP

## 2023-11-27 DIAGNOSIS — M545 Low back pain, unspecified: Secondary | ICD-10-CM | POA: Insufficient documentation

## 2023-11-27 NOTE — Assessment & Plan Note (Signed)
 No red flags on exam today.  Patient is unable to take NSAIDs.  Will add steroid taper and muscle relaxer for pain relief.  Recommend soaking in Epsom salt bath and heating pad to affected area.  Recommend follow-up if no improvement in 5 to 7 days or sooner if symptoms worsen.

## 2023-12-01 ENCOUNTER — Encounter (HOSPITAL_COMMUNITY): Payer: Self-pay | Admitting: Emergency Medicine

## 2023-12-01 ENCOUNTER — Emergency Department (HOSPITAL_COMMUNITY)
Admission: EM | Admit: 2023-12-01 | Discharge: 2023-12-01 | Disposition: A | Discharge status: Home / Self Care | Attending: Emergency Medicine | Admitting: Emergency Medicine

## 2023-12-01 ENCOUNTER — Other Ambulatory Visit: Payer: Self-pay

## 2023-12-01 DIAGNOSIS — Z79899 Other long term (current) drug therapy: Secondary | ICD-10-CM | POA: Diagnosis not present

## 2023-12-01 DIAGNOSIS — M545 Low back pain, unspecified: Secondary | ICD-10-CM | POA: Insufficient documentation

## 2023-12-01 DIAGNOSIS — O133 Gestational [pregnancy-induced] hypertension without significant proteinuria, third trimester: Secondary | ICD-10-CM

## 2023-12-01 DIAGNOSIS — I10 Essential (primary) hypertension: Secondary | ICD-10-CM | POA: Diagnosis not present

## 2023-12-01 DIAGNOSIS — R030 Elevated blood-pressure reading, without diagnosis of hypertension: Secondary | ICD-10-CM | POA: Diagnosis present

## 2023-12-01 DIAGNOSIS — M5459 Other low back pain: Secondary | ICD-10-CM | POA: Diagnosis not present

## 2023-12-01 LAB — COMPREHENSIVE METABOLIC PANEL WITH GFR
ALT: 11 U/L (ref 0–44)
AST: 12 U/L — ABNORMAL LOW (ref 15–41)
Albumin: 3.6 g/dL (ref 3.5–5.0)
Alkaline Phosphatase: 84 U/L (ref 38–126)
Anion gap: 7 (ref 5–15)
BUN: 10 mg/dL (ref 6–20)
CO2: 25 mmol/L (ref 22–32)
Calcium: 9.1 mg/dL (ref 8.9–10.3)
Chloride: 104 mmol/L (ref 98–111)
Creatinine, Ser: 0.54 mg/dL (ref 0.44–1.00)
GFR, Estimated: >60 mL/min (ref >60)
Glucose, Bld: 97 mg/dL (ref 70–99)
Potassium: 3.6 mmol/L (ref 3.5–5.1)
Sodium: 136 mmol/L (ref 135–145)
Total Bilirubin: 0.7 mg/dL (ref 0.0–1.2)
Total Protein: 6.7 g/dL (ref 6.5–8.1)

## 2023-12-01 LAB — CBC WITH DIFFERENTIAL/PLATELET
Abs Immature Granulocytes: 0.03 10*3/uL (ref 0.00–0.07)
Basophils Absolute: 0 10*3/uL (ref 0.0–0.1)
Basophils Relative: 1 %
Eosinophils Absolute: 0.3 10*3/uL (ref 0.0–0.5)
Eosinophils Relative: 4 %
HCT: 37.4 % (ref 36.0–46.0)
Hemoglobin: 13.2 g/dL (ref 12.0–15.0)
Immature Granulocytes: 0 %
Lymphocytes Relative: 26 %
Lymphs Abs: 1.8 10*3/uL (ref 0.7–4.0)
MCH: 29.3 pg (ref 26.0–34.0)
MCHC: 35.3 g/dL (ref 30.0–36.0)
MCV: 83.1 fL (ref 80.0–100.0)
Monocytes Absolute: 0.6 10*3/uL (ref 0.1–1.0)
Monocytes Relative: 10 %
Neutro Abs: 4 10*3/uL (ref 1.7–7.7)
Neutrophils Relative %: 59 %
Platelets: 241 10*3/uL (ref 150–400)
RBC: 4.5 MIL/uL (ref 3.87–5.11)
RDW: 12.7 % (ref 11.5–15.5)
WBC: 6.7 10*3/uL (ref 4.0–10.5)
nRBC: 0 % (ref 0.0–0.2)

## 2023-12-01 LAB — URINALYSIS, ROUTINE W REFLEX MICROSCOPIC
Bilirubin Urine: NEGATIVE
Glucose, UA: NEGATIVE mg/dL
Hgb urine dipstick: NEGATIVE
Ketones, ur: NEGATIVE mg/dL
Leukocytes,Ua: NEGATIVE
Nitrite: NEGATIVE
Protein, ur: NEGATIVE mg/dL
Specific Gravity, Urine: 1.008 (ref 1.005–1.030)
pH: 7 (ref 5.0–8.0)

## 2023-12-01 MED ORDER — TRAMADOL HCL 50 MG PO TABS: 50.0000 mg | ORAL_TABLET | Freq: Four times a day (QID) | ORAL | 0 refills | Status: DC | PRN

## 2023-12-01 MED ORDER — NIFEDIPINE ER OSMOTIC RELEASE 60 MG PO TB24: 60.0000 mg | ORAL_TABLET | Freq: Every day | ORAL | 0 refills | Status: DC

## 2023-12-01 NOTE — ED Provider Notes (Signed)
 [place] EMERGENCY DEPARTMENT AT [facility] Provider Note   CSN: [id number] Arrival date & time: 12/01/23  1647     History  Chief Complaint  Patient presents with   Hypertension    Julie Case is a 33 y.o. female.   Hypertension Associated symptoms include headaches. Pertinent negatives include no chest pain, no abdominal pain and no shortness of breath.       Julie Case is a 33 y.o. female past medical history of seizure disorder, gestational hypertension, 8 weeks postpartum with uncomplicated pregnancy vaginal delivery who presents to the Emergency Department complaining of elevated blood pressure.  Takes nifedipine  daily, denies any missed doses.  States her systolic blood pressure was in the 170s this morning.  She also complains of pain of her lower back, pain mostly left-sided.  Pain does not radiate into her lower extremities.  She denies any abdominal pain or swelling.  She does endorse some frontal headache gradual onset this morning with visual changes.  She denies any recent injury or fall.  She was having low back pain prior to her delivery.  She has been on her current blood pressure medication throughout her pregnancy.  He is currently taking prednisone  and muscle relaxer without relief of her low back pain  Home Medications Prior to Admission medications   Medication Sig Start Date End Date Taking? Authorizing Provider  acetaminophen  (TYLENOL ) 500 MG tablet Take 2 tablets (1,000 mg total) by mouth every 8 (eight) hours. 10/08/23   Liliana Regulus, MD  Blood Pressure Monitor MISC For regular home bp monitoring during pregnancy 04/17/23   Jolayne Natter, CNM  furosemide  (LASIX ) 20 MG tablet Take 1 tablet (20 mg total) by mouth daily for 5 days. 10/14/23 11/26/23  Ozan, Jennifer, DO  methylPREDNISolone  (MEDROL  DOSEPAK) 4 MG TBPK tablet Take as package instructions. 11/26/23   Alison Irvine, FNP  NIFEdipine  (PROCARDIA  XL) 30 MG 24 hr tablet Take 2 tablets (60  mg total) by mouth daily. 11/11/23   Zelma Hidden, FNP  Prenatal Vit-Fe Fumarate-FA (PRENATAL VITAMIN PO) Take by mouth.    [provider]  tiZANidine  (ZANAFLEX ) 4 MG tablet Take 0.5-1 tablets (2-4 mg total) by mouth every 8 (eight) hours as needed for muscle spasms (back pain). 11/26/23   Alison Irvine, FNP      Allergies    Penicillins and Naproxen    Review of Systems   Review of Systems  Constitutional:  Negative for chills and fever.  Eyes:  Negative for visual disturbance.  Respiratory:  Negative for shortness of breath.   Cardiovascular:  Negative for chest pain.  Gastrointestinal:  Negative for abdominal pain, nausea and vomiting.  Genitourinary:  Negative for difficulty urinating and dysuria.  Musculoskeletal:  Positive for back pain. Negative for joint swelling.  Skin:  Negative for rash.  Neurological:  Positive for headaches. Negative for dizziness, seizures, syncope and weakness.    Physical Exam Updated Vital Signs BP (!) 153/96 (BP Location: Right Arm)   Pulse 65   Temp 98.1 F (36.7 C) (Oral)   Resp 16   Ht 5\' 4"  (1.626 m)   Wt 93 kg   SpO2 99%   BMI 35.19 kg/m  Physical Exam Vitals and nursing note reviewed.  Constitutional:      General: She is not in acute distress.    Appearance: Normal appearance. She is not ill-appearing or toxic-appearing.  Eyes:     Extraocular Movements: Extraocular movements intact.  Conjunctiva/sclera: Conjunctivae normal.     Pupils: Pupils are equal, round, and reactive to light.  Cardiovascular:     Rate and Rhythm: Normal rate and regular rhythm.     Pulses: Normal pulses.  Pulmonary:     Effort: Pulmonary effort is normal.  Abdominal:     Palpations: Abdomen is soft.     Tenderness: There is no abdominal tenderness.  Musculoskeletal:     Cervical back: Normal range of motion.     Right lower leg: No edema.     Left lower leg: No edema.  Skin:    General: Skin is warm.     Capillary Refill:  Capillary refill takes less than 2 seconds.  Neurological:     General: No focal deficit present.     Mental Status: She is alert.     Sensory: No sensory deficit.     Motor: No weakness.     ED Results / Procedures / Treatments   Labs (all labs ordered are listed, but only abnormal results are displayed) Labs Reviewed  URINALYSIS, ROUTINE W REFLEX MICROSCOPIC - Abnormal; Notable for the following components:      Result Value   Color, Urine STRAW (*)    All other components within normal limits  COMPREHENSIVE METABOLIC PANEL WITH GFR - Abnormal; Notable for the following components:   AST 12 (*)    All other components within normal limits  CBC WITH DIFFERENTIAL/PLATELET    EKG None  Radiology No results found.  Procedures Procedures    Medications Ordered in ED Medications - No data to display  ED Course/ Medical Decision Making/ A&P                                 Medical Decision Making Patient is 8 weeks postpartum with gestational hypertension currently on nifedipine .  Here for low back pain, headache and elevated blood pressure.  Denies any missed doses of her medications.  She is had back pain prior to her delivery, no new or worsening symptoms.  Currently taking muscle relaxer and prednisone  without significant improvement.  She denies any vomiting, abdominal pain, vaginal bleeding or edema.  Currently, her blood pressure is 153/96.  I do not appreciate any peripheral edema on her exam.  She is ambulatory in the department with steady gait. Postpartum preeclampsia is of concern.  Will check labs and urinalysis.  Amount and/or Complexity of Data Reviewed Labs: ordered.    Details: Labs overall reassuring.  LFTs unremarkable, no proteinuria.  Chemistries reassuring Discussion of management or test interpretation with external provider(s): Discussed with OB/GYN, Dr. Willey Harrier, patient likely with essential hypertension.  No concerning symptoms for preeclampsia.   Recommends increase nifedipine  to 60 mg doses and patient to follow-up with PCP regarding further blood pressure management  Risk Prescription drug management.           Final Clinical Impression(s) / ED Diagnoses Final diagnoses:  Hypertension, unspecified type  Acute left-sided low back pain without sciatica    Rx / DC Orders ED Discharge Orders     None         Catherne Clubs, PA-C 12/01/23 1950    Guadalupe Lee, MD 12/01/23 2027

## 2023-12-01 NOTE — Discharge Instructions (Signed)
 Please start your increased dose of your blood pressure medication.  You have been prescribed tramadol for your low back pain.  Please call your primary care provider this week to arrange follow-up appointment for recheck of your blood pressure.

## 2023-12-01 NOTE — ED Notes (Signed)
 Pt/family received d/c paperwork at this time. After going over the paperwork any questions, comments, or concerns were answered to the best of this nurse's knowledge. The pt/family verbally acknowledged the teachings/instructions.

## 2023-12-01 NOTE — ED Triage Notes (Signed)
 Pt states she is 8weeks PP w/ hypertension. Pt does not know what BP med she takes. States she took it as prescribed this morning and her BP remained elevated with systolic of 170s. Pt bp is 153/96. Also c/o of lower back pain.

## 2023-12-02 ENCOUNTER — Ambulatory Visit (INDEPENDENT_AMBULATORY_CARE_PROVIDER_SITE_OTHER)

## 2023-12-02 ENCOUNTER — Other Ambulatory Visit: Payer: Self-pay | Admitting: Women's Health

## 2023-12-02 VITALS — BP 139/85 | HR 98

## 2023-12-02 DIAGNOSIS — Z8759 Personal history of other complications of pregnancy, childbirth and the puerperium: Secondary | ICD-10-CM

## 2023-12-02 MED ORDER — NIFEDIPINE ER OSMOTIC RELEASE 60 MG PO TB24: 60.0000 mg | ORAL_TABLET | Freq: Every day | ORAL | 0 refills | Status: AC

## 2023-12-02 NOTE — Progress Notes (Signed)
   NURSE VISIT- BLOOD PRESSURE CHECK  SUBJECTIVE:  Julie Case is a 33 y.o. [phone] female here for BP check. She is postpartum, delivery date 10/06/2023     HYPERTENSION ROS:  Pregnant/postpartum:  Severe headaches that don't go away with tylenol /other medicines: No  Visual changes (seeing spots/double/blurred vision) No  Severe pain under right breast breast or in center of upper chest No  Severe nausea/vomiting No  Taking medicines as instructed yes   OBJECTIVE:  BP 139/85 (BP Location: Right Arm, Patient Position: Sitting, Cuff Size: Normal)   Pulse 98   Appearance alert, well appearing, and in no distress.  ASSESSMENT: Postpartum  blood pressure check  PLAN: Discussed with Benny Braver, CNM, [facility]   Recommendations: no changes needed  Follow-up: follow-up with Primary Care Provider regarding Blood Pressures  Warden Ha  12/02/2023 10:45 AM

## 2023-12-19 ENCOUNTER — Ambulatory Visit (INDEPENDENT_AMBULATORY_CARE_PROVIDER_SITE_OTHER): Admitting: Family Medicine

## 2023-12-19 ENCOUNTER — Encounter: Payer: Self-pay | Admitting: Family Medicine

## 2023-12-19 VITALS — BP 138/92 | HR 94 | Resp 16 | Ht 64.0 in | Wt 206.0 lb

## 2023-12-19 DIAGNOSIS — F3289 Other specified depressive episodes: Secondary | ICD-10-CM

## 2023-12-19 DIAGNOSIS — Z0001 Encounter for general adult medical examination with abnormal findings: Secondary | ICD-10-CM

## 2023-12-19 DIAGNOSIS — Z13220 Encounter for screening for lipoid disorders: Secondary | ICD-10-CM

## 2023-12-19 DIAGNOSIS — Z8759 Personal history of other complications of pregnancy, childbirth and the puerperium: Secondary | ICD-10-CM | POA: Diagnosis not present

## 2023-12-19 DIAGNOSIS — E559 Vitamin D deficiency, unspecified: Secondary | ICD-10-CM | POA: Diagnosis not present

## 2023-12-19 MED ORDER — PAROXETINE HCL 30 MG PO TABS: 30.0000 mg | ORAL_TABLET | Freq: Every day | ORAL | 1 refills | Status: AC

## 2023-12-19 NOTE — Addendum Note (Signed)
 Addended by: Dragan Tamburrino H on: 12/19/2023 03:35 PM   Modules accepted: Orders

## 2023-12-19 NOTE — Assessment & Plan Note (Addendum)
 Uncontrolled Blood Pressure: The patient's blood pressure is elevated in the clinic today. She is currently prescribed Procardia  (Nifedipine ) 60 mg daily but reports not taking her medication today, which may have contributed to the elevated reading. The patient was encouraged to maintain strict adherence to her prescribed antihypertensive regimen and to monitor her blood pressure at home, reporting any readings >=140/90 mmHg. Additionally, the importance of lifestyle modifications was reinforced, including a low-sodium diet and increased physical activity to support blood pressure control.

## 2023-12-19 NOTE — Assessment & Plan Note (Signed)
 Denies SI/HI Encouraged to resume therapy on Paxil  30 mg daily Alternatives, risks, benefits, and side effects of treatment were discussed. Informed consent was obtained for  all medications and treatment. The patient was involved in the decision-making  and treatment planning of their care

## 2023-12-19 NOTE — Patient Instructions (Addendum)
 I appreciate the opportunity to provide care to you today!    Follow up:  4 months  Labs: please stop by the lab today to get your blood drawn (CBC, CMP, TSH, Lipid profile, HgA1c, Vit D)  Hypertension (HTN): -Continue taking Procardia  (Nifedipine ) 60 mg daily -Monitor blood pressure at home and report any readings >=140/90 mmHg -Recommend adherence to a low-sodium diet and increased physical activity to support blood pressure control  Depression: -Resume therapy on Paxil  (Paroxetine ) 30 mg daily -Monitor for mood changes, side effects, or signs of worsening depression   Please continue to a heart-healthy diet and increase your physical activities. Try to exercise for at least five days a week.    It was a pleasure to see you and I look forward to continuing to work together on your health and well-being. Please do not hesitate to call the office if you need care or have questions about your care.  In case of emergency, please visit the Emergency Department for urgent care, or contact our clinic at [phone] to schedule an appointment. We're here to help you!   Have a wonderful day and week. With Gratitude, Kamaljit Hizer MSN, FNP-BC

## 2023-12-19 NOTE — Assessment & Plan Note (Signed)

## 2023-12-19 NOTE — Progress Notes (Addendum)
 Complete physical exam  Patient: Julie Case   DOB: 12/28/1990   32 y.o. Female  MRN: [id number]  Subjective:     Chief Complaint  Patient presents with   Annual Exam    Wants to start back taking her depression meds, had to stop while pregnant.     Julie Case is a 33 y.o. female who presents today for a complete physical exam. She reports consuming a general diet. The patient does not participate in regular exercise at present. She generally feels well. She reports sleeping well. She does have additional problems to discuss today.    Most recent fall risk assessment:    12/19/2023    2:59 PM  Fall Risk   Falls in the past year? 0  Number falls in past yr: 0  Injury with Fall? 0  Follow up Falls evaluation completed     Most recent depression screenings:    12/19/2023    2:59 PM 11/26/2023    3:12 PM  PHQ 2/9 Scores  PHQ - 2 Score 1 0  PHQ- 9 Score 7 1    Dental: No current dental problems and No regular dental care   Patient Active Problem List   Diagnosis Date Noted   Encounter for general adult medical examination with abnormal findings 12/19/2023   Left lumbar pain 11/27/2023   H/O tubal ligation 10/07/2023   History of gestational hypertension 10/06/2023   History of seizure disorder 04/17/2023   Depression 01/24/2022   Past Medical History:  Diagnosis Date   Depression    Epilepsy (HCC)    Pregnancy induced hypertension    Past Surgical History:  Procedure Laterality Date   CHOLECYSTECTOMY     ECTOPIC PREGNANCY SURGERY     with right tube removal   TUBAL LIGATION N/A 10/07/2023   Procedure: LIGATION, FALLOPIAN TUBE, POSTPARTUM;  Surgeon: Malka Sea, DO;  Location: [facility] LD ORS;  Service: Gynecology;  Laterality: N/A;   Social History   Tobacco Use   Smoking status: Former    Current packs/day: 0.50    Types: Cigarettes   Smokeless tobacco: Never   Tobacco comments:    1/2 pack a day  Vaping Use   Vaping status: Never Used  Substance Use  Topics   Alcohol use: Not Currently   Drug use: Not Currently   Social History   Socioeconomic History   Marital status: Single    Spouse name: Not on file   Number of children: Not on file   Years of education: Not on file   Highest education level: 11th grade  Occupational History   Not on file  Tobacco Use   Smoking status: Former    Current packs/day: 0.50    Types: Cigarettes   Smokeless tobacco: Never   Tobacco comments:    1/2 pack a day  Vaping Use   Vaping status: Never Used  Substance and Sexual Activity   Alcohol use: Not Currently   Drug use: Not Currently   Sexual activity: Not Currently    Birth control/protection: Surgical    Comment: tubal  Other Topics Concern   Not on file  Social History Narrative   Not on file   Social Drivers of Health   Financial Resource Strain: Low Risk  (08/02/2023)   Overall Financial Resource Strain (CARDIA)    Difficulty of Paying Living Expenses: Not hard at all  Food Insecurity: No Food Insecurity (10/06/2023)   Hunger Vital Sign    Worried About  Running Out of Food in the Last Year: Never true    Ran Out of Food in the Last Year: Never true  Transportation Needs: No Transportation Needs (10/06/2023)   PRAPARE - Administrator, Civil Service (Medical): No    Lack of Transportation (Non-Medical): No  Physical Activity: Unknown (08/02/2023)   Exercise Vital Sign    Days of Exercise per Week: 3 days    Minutes of Exercise per Session: Patient declined  Stress: No Stress Concern Present (08/02/2023)   [employer] of Occupational Health - Occupational Stress Questionnaire    Feeling of Stress : Only a little  Social Connections: Unknown (08/02/2023)   Social Connection and Isolation Panel [NHANES]    Frequency of Communication with Friends and Family: Twice a week    Frequency of Social Gatherings with Friends and Family: Twice a week    Attends Religious Services: Patient declined    Database administrator or  Organizations: No    Attends Engineer, structural: Not on file    Marital Status: Living with partner  Intimate Partner Violence: Not At Risk (10/06/2023)   Humiliation, Afraid, Rape, and Kick questionnaire    Fear of Current or Ex-Partner: No    Emotionally Abused: No    Physically Abused: No    Sexually Abused: No   Family Status  Relation Name Status   PGF  Deceased   PGM  Deceased   MGM  Deceased   MGF  Deceased   Father  Alive   Mother  Alive   Brother  Alive   Brother  Alive   Sister x4 Alive   Other  (Not Specified)   Daughter  Estate manager/land agent   Daughter  Alive   Daughter  Alive   Daughter  Alive   Daughter  Alive  No partnership data on file   Family History  Problem Relation Age of Onset   Diabetes Maternal Grandmother    Diabetes Maternal Grandfather    Hypertension Father    Hypercholesterolemia Father    Hypertension Mother    Hypercholesterolemia Mother    Schizophrenia Brother    Hypertension Other    Diabetes Other    Allergies  Allergen Reactions   Penicillins Shortness Of Breath and Rash   Naproxen Nausea And Vomiting      Patient Care Team: Eldena Dede, FNP as PCP - General (Family Medicine)   Outpatient Medications Prior to Visit  Medication Sig   NIFEdipine  (PROCARDIA  XL/NIFEDICAL XL) 60 MG 24 hr tablet Take 1 tablet (60 mg total) by mouth daily.   [DISCONTINUED] acetaminophen  (TYLENOL ) 500 MG tablet Take 2 tablets (1,000 mg total) by mouth every 8 (eight) hours.   [DISCONTINUED] Blood Pressure Monitor MISC For regular home bp monitoring during pregnancy   [DISCONTINUED] furosemide  (LASIX ) 20 MG tablet Take 1 tablet (20 mg total) by mouth daily for 5 days.   [DISCONTINUED] methylPREDNISolone  (MEDROL  DOSEPAK) 4 MG TBPK tablet Take as package instructions.   [DISCONTINUED] Prenatal Vit-Fe Fumarate-FA (PRENATAL VITAMIN PO) Take by mouth.   [DISCONTINUED] tiZANidine  (ZANAFLEX ) 4 MG tablet Take 0.5-1 tablets (2-4 mg total)  by mouth every 8 (eight) hours as needed for muscle spasms (back pain).   [DISCONTINUED] traMADol  (ULTRAM ) 50 MG tablet Take 1 tablet (50 mg total) by mouth every 6 (six) hours as needed.   No facility-administered medications prior to visit.    Review of Systems  Constitutional:  Negative for chills, fever  and malaise/fatigue.  HENT:  Negative for congestion and sinus pain.   Eyes:  Negative for pain, discharge and redness.  Respiratory:  Negative for cough, sputum production and shortness of breath.   Cardiovascular:  Negative for chest pain, palpitations, claudication and leg swelling.  Gastrointestinal:  Negative for diarrhea, heartburn and nausea.  Genitourinary:  Negative for flank pain and frequency.  Musculoskeletal:  Negative for back pain and joint pain.  Skin:  Negative for itching.  Neurological:  Negative for dizziness, seizures and headaches.  Endo/Heme/Allergies:  Negative for environmental allergies.  Psychiatric/Behavioral:  Negative for memory loss. The patient does not have insomnia.        Objective:    BP (!) 138/92   Pulse 94   Resp 16   Ht 5\' 4"  (1.626 m)   Wt 206 lb (93.4 kg)   Breastfeeding No   BMI 35.36 kg/m  BP Readings from Last 3 Encounters:  12/19/23 (!) 138/92  12/02/23 139/85  12/01/23 (!) 171/92   Wt Readings from Last 3 Encounters:  12/19/23 206 lb (93.4 kg)  12/01/23 205 lb 0.4 oz (93 kg)  11/26/23 204 lb 0.6 oz (92.6 kg)      Physical Exam HENT:     Head: Normocephalic.     Left Ear: External ear normal.     Mouth/Throat:     Mouth: Mucous membranes are moist.  Eyes:     Extraocular Movements: Extraocular movements intact.     Pupils: Pupils are equal, round, and reactive to light.  Cardiovascular:     Rate and Rhythm: Normal rate and regular rhythm.     Heart sounds: No murmur heard. Pulmonary:     Effort: Pulmonary effort is normal.     Breath sounds: Normal breath sounds.  Abdominal:     Palpations: Abdomen is soft.      Tenderness: There is no right CVA tenderness or left CVA tenderness.  Musculoskeletal:     Right lower leg: No edema.     Left lower leg: No edema.  Skin:    Findings: No lesion or rash.  Neurological:     Mental Status: She is alert and oriented to person, place, and time.     GCS: GCS eye subscore is 4. GCS verbal subscore is 5. GCS motor subscore is 6.     Cranial Nerves: No facial asymmetry.     Sensory: No sensory deficit.     Motor: No weakness.     Coordination: Coordination normal. Finger-Nose-Finger Test normal.     Gait: Gait normal.  Psychiatric:        Judgment: Judgment normal.     No results found for any visits on 12/19/23. Last CBC Lab Results  Component Value Date   WBC 6.7 12/01/2023   HGB 13.2 12/01/2023   HCT 37.4 12/01/2023   MCV 83.1 12/01/2023   MCH 29.3 12/01/2023   RDW 12.7 12/01/2023   PLT 241 12/01/2023   Last metabolic panel Lab Results  Component Value Date   GLUCOSE 97 12/01/2023   NA 136 12/01/2023   K 3.6 12/01/2023   CL 104 12/01/2023   CO2 25 12/01/2023   BUN 10 12/01/2023   CREATININE 0.54 12/01/2023   GFRNONAA >60 12/01/2023   CALCIUM 9.1 12/01/2023   PROT 6.7 12/01/2023   ALBUMIN 3.6 12/01/2023   LABGLOB 2.5 03/14/2023   AGRATIO 1.9 07/17/2022   BILITOT 0.7 12/01/2023   ALKPHOS 84 12/01/2023   AST 12 (L) 12/01/2023  ALT 11 12/01/2023   ANIONGAP 7 12/01/2023   Last lipids Lab Results  Component Value Date   CHOL 185 03/14/2023   HDL 40 03/14/2023   LDLCALC 121 (H) 03/14/2023   TRIG 132 03/14/2023   CHOLHDL 4.6 (H) 03/14/2023   Last hemoglobin A1c Lab Results  Component Value Date   HGBA1C 5.2 03/14/2023   Last thyroid  functions Lab Results  Component Value Date   TSH 1.470 03/14/2023   Last vitamin D  Lab Results  Component Value Date   VD25OH 25.9 (L) 03/14/2023   Last vitamin B12 and Folate No results found for: "VITAMINB12", "FOLATE"      Assessment & Plan:    Routine Health Maintenance and  Physical Exam  Immunization History  Administered Date(s) Administered   Tdap 04/23/2022, 08/14/2023    Health Maintenance  Topic Date Due   COVID-19 Vaccine (1 - 2024-25 season) Never done   INFLUENZA VACCINE  02/28/2024   Cervical Cancer Screening (HPV/Pap Cotest)  02/13/2027   DTaP/Tdap/Td (3 - Td or Tdap) 08/13/2033   Hepatitis C Screening  Completed   HIV Screening  Completed   HPV VACCINES  Aged Out   Meningococcal B Vaccine  Aged Out    Discussed health benefits of physical activity, and encouraged her to engage in regular exercise appropriate for her age and condition.  Encounter for general adult medical examination with abnormal findings Assessment & Plan: Physical exam as documented Discussed heart-healthy diet  Encouraged to Exercise: If you are able: 30 -60 minutes a day ,4 days a week, or 150 minutes a week. The longer the better. Combine stretch, strength, and aerobic activities Encourage to eat whole Food, Plant Predominant Nutrition is highly recommended: Eat Plenty of vegetables, Mushrooms, fruits, Legumes, Whole Grains, Nuts, seeds in lieu of processed meats, processed snacks/pastries red meat, poultry, eggs.  Will f/u in 1 year for CPE      Other depression Assessment & Plan: Denies SI/HI Encouraged to resume therapy on Paxil  30 mg daily Alternatives, risks, benefits, and side effects of treatment were discussed. Informed consent was obtained for  all medications and treatment. The patient was involved in the decision-making  and treatment planning of their care   Orders: -     PARoxetine  HCl; Take 1 tablet (30 mg total) by mouth daily.  Dispense: 90 tablet; Refill: 1  History of gestational hypertension Assessment & Plan: Uncontrolled Blood Pressure: The patient's blood pressure is elevated in the clinic today. She is currently prescribed Procardia  (Nifedipine ) 60 mg daily but reports not taking her medication today, which may have contributed to the  elevated reading. The patient was encouraged to maintain strict adherence to her prescribed antihypertensive regimen and to monitor her blood pressure at home, reporting any readings >=140/90 mmHg. Additionally, the importance of lifestyle modifications was reinforced, including a low-sodium diet and increased physical activity to support blood pressure control.      Need for lipid screening -     Lipid panel -     Hemoglobin A1c  Vitamin D  insufficiency -     VITAMIN D  25 Hydroxy (Vit-D Deficiency, Fractures)  Note: This chart has been completed using Engineer, civil (consulting) software, and while attempts have been made to ensure accuracy, certain words and phrases may not be transcribed as intended.    Return in about 4 months (around 04/20/2024).     Deneshia Zucker, FNP

## 2023-12-23 IMAGING — US US OB < 14 WEEKS - US OB TV
1 series · 14 of 28 positions shown · non-contrast
Comparison: None Available.

CLINICAL DATA: Nausea, positive urine pregnancy test, history of
right tubal ectopic pregnancy status post salpingectomy

EXAM:
OBSTETRIC <14 WK ULTRASOUND
TECHNIQUE: Transabdominal ultrasound was performed for evaluation of the
gestation as well as the maternal uterus and adnexal regions.

[Series 1: us ob less than 14 weeks with ob transvaginal · 112 acquisitions, 14 frames shown]
[im 5/112]
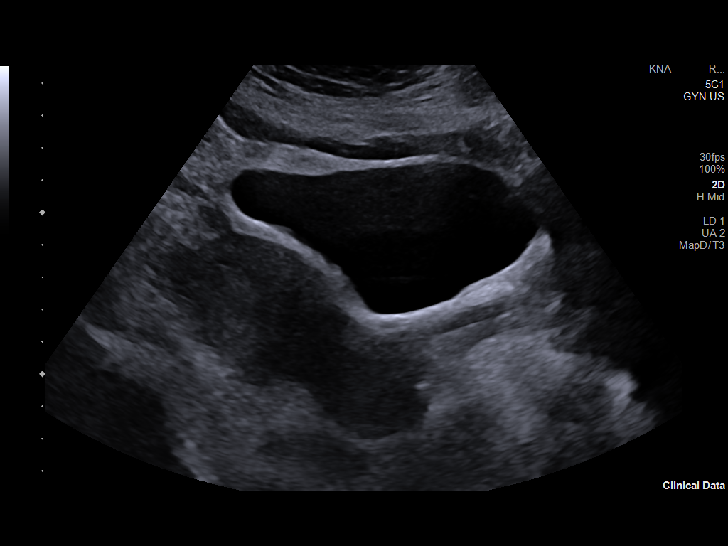
[im 13/112]
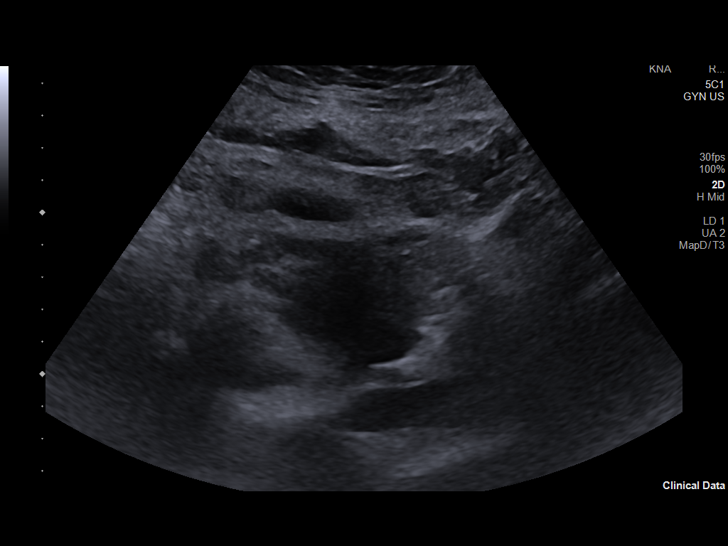
[im 21/112]
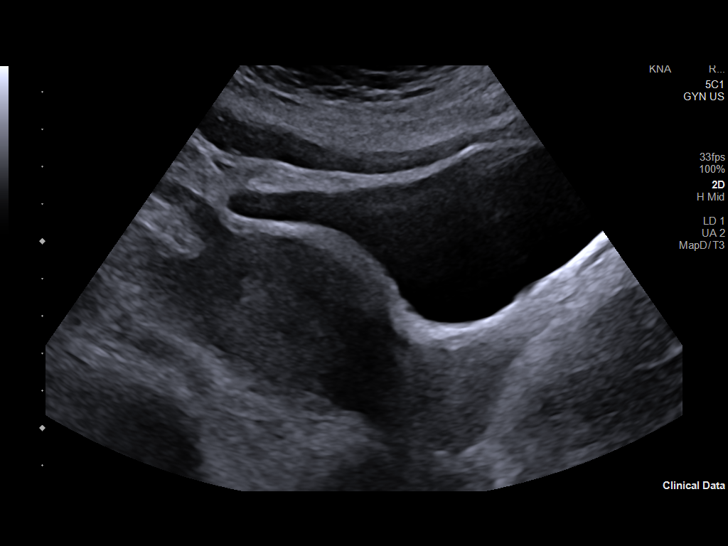
[im 29/112]
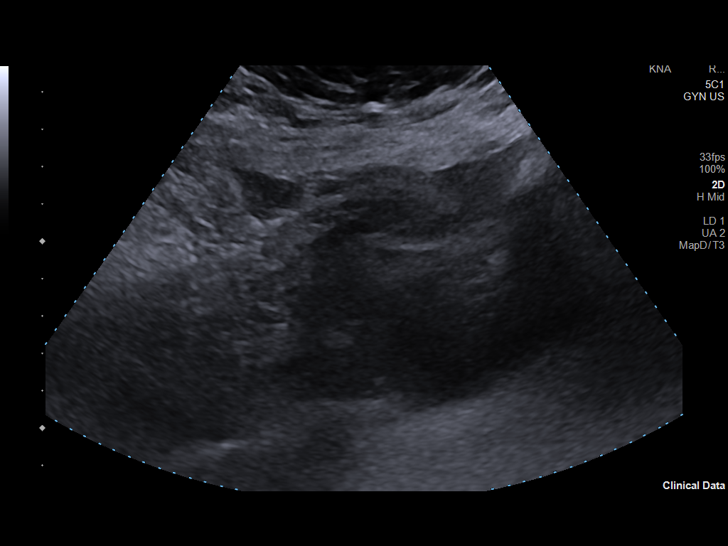
[im 38/112]
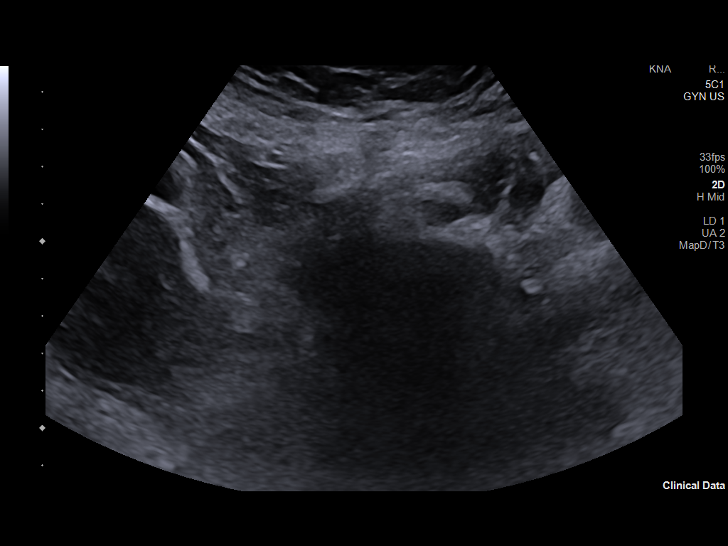
[im 46/112]
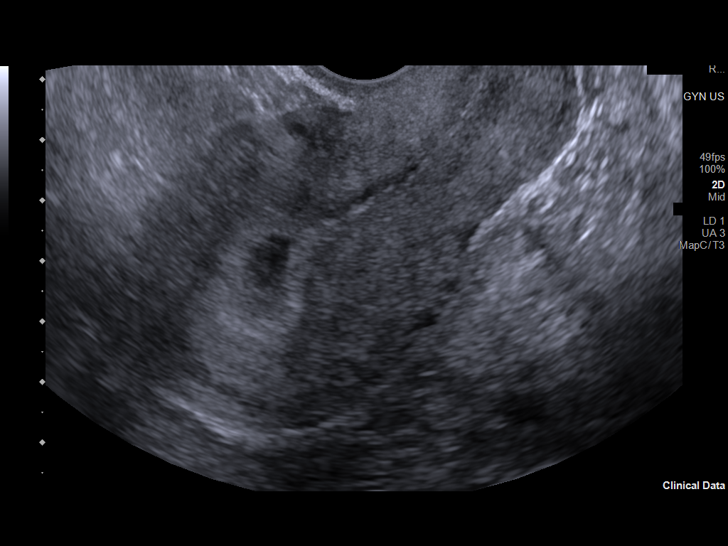
[im 54/112]
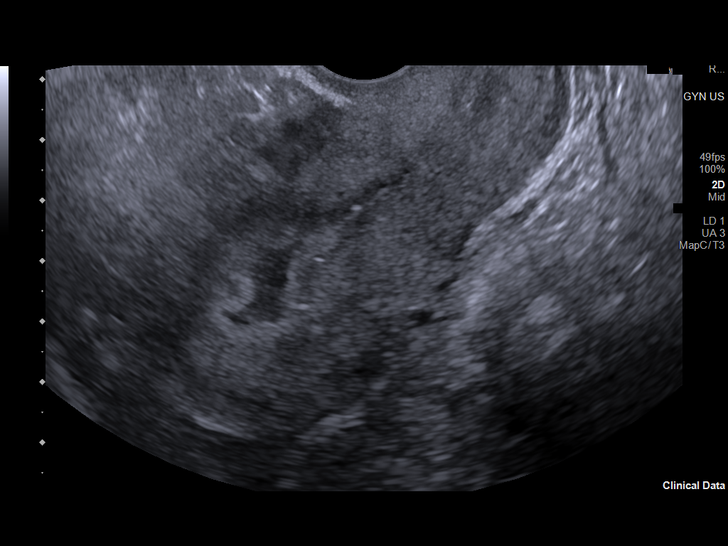
[im 62/112]
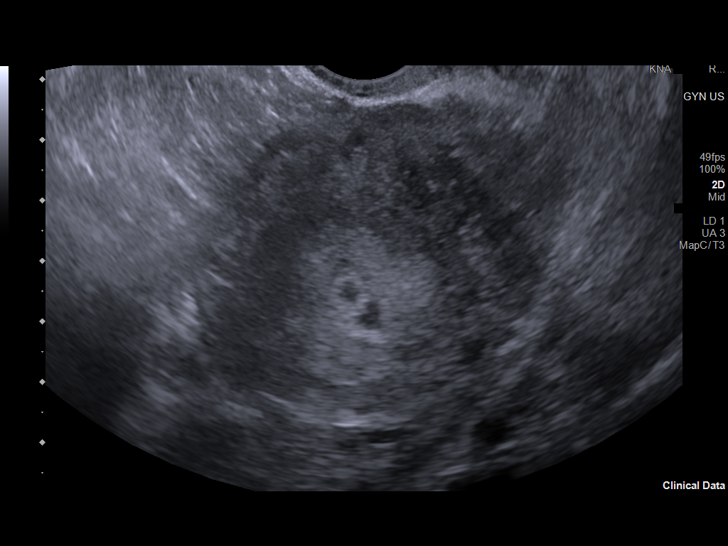
[im 70/112]
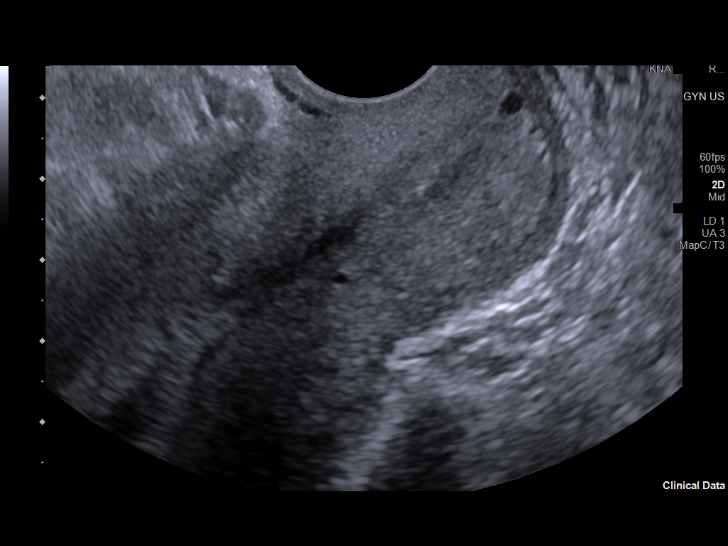
[im 79/112]
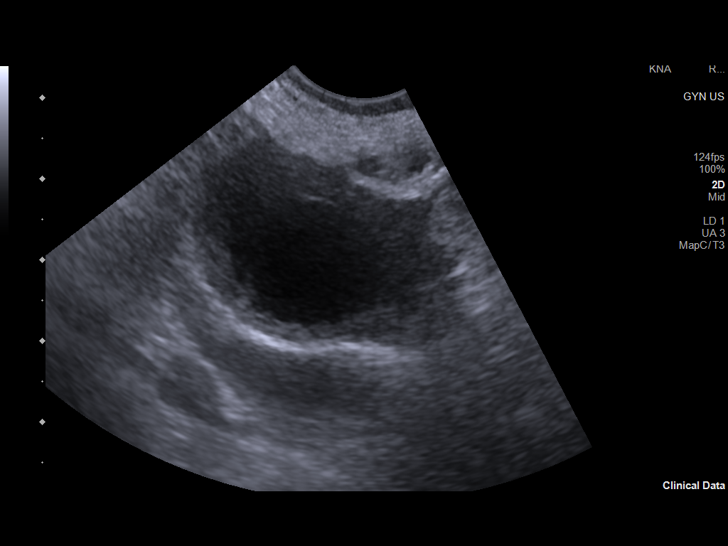
[im 87/112]
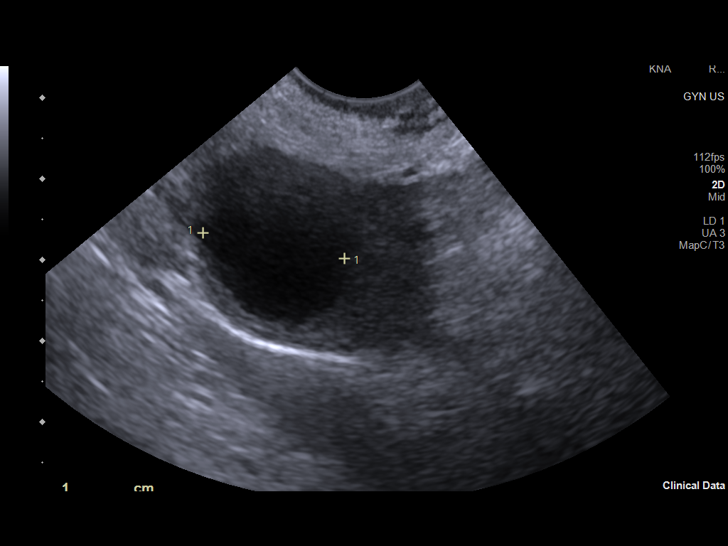
[im 95/112]
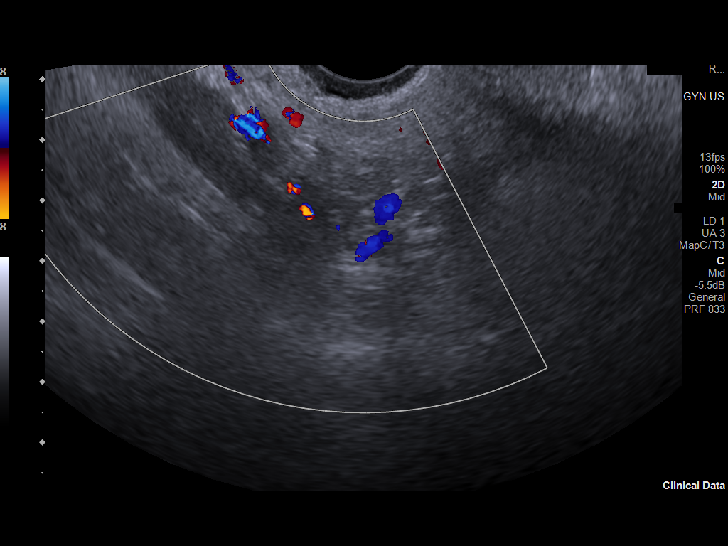
[im 103/112]
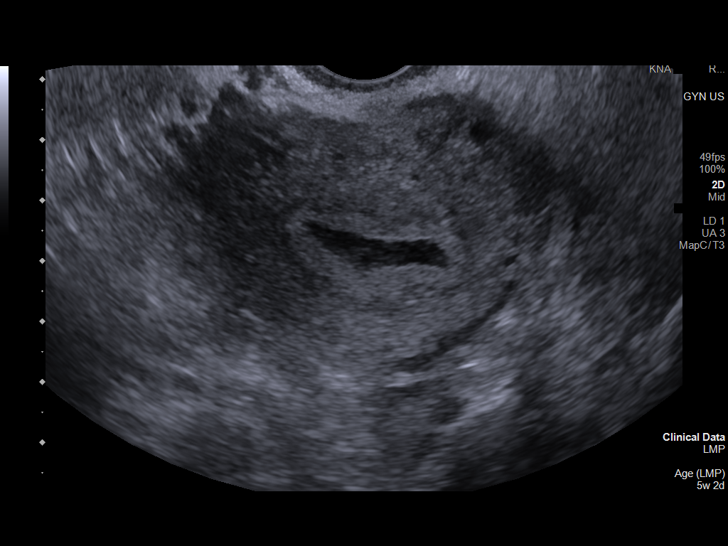
[im 112/112]
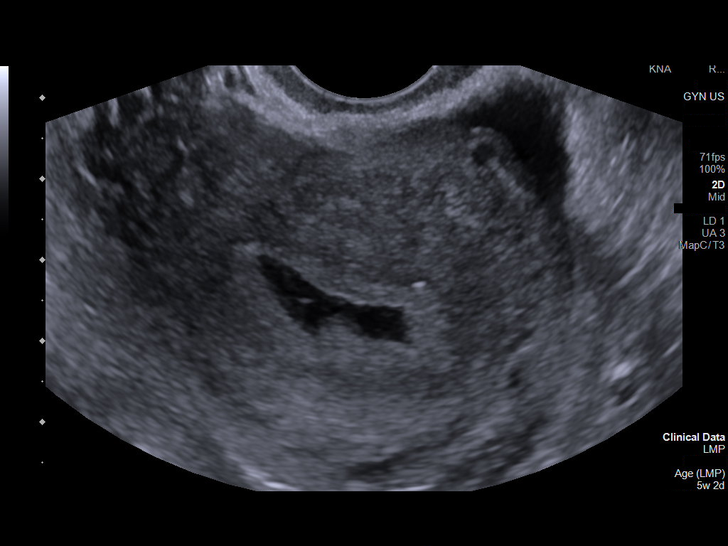

[14 of 28 positions shown; findings below may reference images not displayed]

FINDINGS: Intrauterine gestational sac: Single

Yolk sac:  Visualized.

Embryo:  Not Visualized.

Cardiac Activity: Not Visualized.

Heart Rate: Not visualized.

MSD:  17.5 mm   6 w   5 d

Subchorionic hemorrhage:  None visualized.

Maternal uterus/adnexae: Trace pleural fluid in the low pelvis.
IMPRESSION: 1. Single candidate intrauterine gestational sac in the endometrial
cavity. Yolk sac is visualized without fetal pole at this time.
Early pregnancy of uncertain viability. Recommend follow-up
quantitative B-HCG levels and follow-up US in 7-14 days to assess
continued development and viability.

2.  Trace nonspecific free fluid in the low pelvis.

## 2024-01-13 ENCOUNTER — Ambulatory Visit
Admission: EM | Admit: 2024-01-13 | Discharge: 2024-01-13 | Disposition: A | Discharge status: Home / Self Care | Attending: Nurse Practitioner | Admitting: Nurse Practitioner

## 2024-01-13 DIAGNOSIS — K0889 Other specified disorders of teeth and supporting structures: Secondary | ICD-10-CM | POA: Diagnosis not present

## 2024-01-13 DIAGNOSIS — K047 Periapical abscess without sinus: Secondary | ICD-10-CM | POA: Diagnosis not present

## 2024-01-13 MED ORDER — CLINDAMYCIN HCL 300 MG PO CAPS: 300.0000 mg | ORAL_CAPSULE | Freq: Three times a day (TID) | ORAL | 0 refills | Status: AC

## 2024-01-13 NOTE — Discharge Instructions (Signed)
 Take medication as prescribed.  Recommend taking the medication with a probiotic, you can pick up any probiotic over-the-counter. Continue over-the-counter Tylenol  as needed for pain or discomfort. Warm salt water gargles 3-4 times daily until symptoms improve. Recommend warm compresses to the affected area to help with pain and discomfort. I have provided a dental resource guide for you to follow-up with a dentist. Recommend following up a dentist within the next 7 to 10 days.  Follow-up as needed.

## 2024-01-13 NOTE — ED Triage Notes (Signed)
 Pain on right side of jaw going to ear x 3 days. Pt thinks it may be a tooth abscess. Taking tylenol .

## 2024-01-13 NOTE — ED Provider Notes (Signed)
 [facility]    CSN: [id number] Arrival date & time: 01/13/24  1247      History   Chief Complaint Chief Complaint  Patient presents with   Dental Pain    HPI Julie Case is a 33 y.o. female.   The history is provided by the patient.   Patient with a 3-day history of pain in the right lower jaw.  Patient states when she woke up this morning, she had swelling to the affected area.  Endorses pain with eating.  She states that she does have a tooth in the lower portion of her mouth that needs dental work.  Patient denies fever, chills, chest pain, abdominal pain, nausea, vomiting, diarrhea, or rash.  States she has been taking Tylenol  for her symptoms. Past Medical History:  Diagnosis Date   Depression    Epilepsy (HCC)    Pregnancy induced hypertension     Patient Active Problem List   Diagnosis Date Noted   Encounter for general adult medical examination with abnormal findings 12/19/2023   Left lumbar pain 11/27/2023   H/O tubal ligation 10/07/2023   History of gestational hypertension 10/06/2023   History of seizure disorder 04/17/2023   Depression 01/24/2022    Past Surgical History:  Procedure Laterality Date   CHOLECYSTECTOMY     ECTOPIC PREGNANCY SURGERY     with right tube removal   TUBAL LIGATION N/A 10/07/2023   Procedure: LIGATION, FALLOPIAN TUBE, POSTPARTUM;  Surgeon: Malka Sea, DO;  Location: [facility];  Service: Gynecology;  Laterality: N/A;    OB History     Gravida  9   Para  6   Term  6   Preterm  0   AB  3   Living  6      SAB  2   IAB  0   Ectopic  1   Multiple  0   Live Births  6            Home Medications    Prior to Admission medications   Medication Sig Start Date End Date Taking? Authorizing Provider  NIFEdipine  (PROCARDIA  XL/NIFEDICAL XL) 60 MG 24 hr tablet Take 1 tablet (60 mg total) by mouth daily. 12/02/23   Ferd Householder, CNM  PARoxetine  (PAXIL ) 30 MG tablet Take 1 tablet (30 mg total)  by mouth daily. 12/19/23   Zarwolo, Gloria, FNP    Family History Family History  Problem Relation Age of Onset   Diabetes Maternal Grandmother    Diabetes Maternal Grandfather    Hypertension Father    Hypercholesterolemia Father    Hypertension Mother    Hypercholesterolemia Mother    Schizophrenia Brother    Hypertension Other    Diabetes Other     Social History Social History   Tobacco Use   Smoking status: Former    Current packs/day: 0.50    Types: Cigarettes   Smokeless tobacco: Never   Tobacco comments:    1/2 pack a day  Vaping Use   Vaping status: Some Days  Substance Use Topics   Alcohol use: Not Currently   Drug use: Not Currently     Allergies   Penicillins and Naproxen   Review of Systems Review of Systems Per HPI  Physical Exam Triage Vital Signs ED Triage Vitals  Encounter Vitals Group     BP 01/13/24 1349 132/83     Girls Systolic BP Percentile --      Girls Diastolic BP Percentile --  Boys Systolic BP Percentile --      Boys Diastolic BP Percentile --      Pulse Rate 01/13/24 1349 65     Resp 01/13/24 1349 16     Temp 01/13/24 1349 97.8 F (36.6 C)     Temp Source 01/13/24 1349 Oral     SpO2 01/13/24 1349 97 %     Weight --      Height --      Head Circumference --      Peak Flow --      Pain Score 01/13/24 1351 8     Pain Loc --      Pain Education --      Exclude from Growth Chart --    No data found.  Updated Vital Signs BP 132/83 (BP Location: Right Arm)   Pulse 65   Temp 97.8 F (36.6 C) (Oral)   Resp 16   LMP 12/18/2023 (Approximate)   SpO2 97%   Breastfeeding No   Visual Acuity Right Eye Distance:   Left Eye Distance:   Bilateral Distance:    Right Eye Near:   Left Eye Near:    Bilateral Near:     Physical Exam Vitals and nursing note reviewed.  Constitutional:      General: She is not in acute distress.    Appearance: Normal appearance.  HENT:     Head: Normocephalic.     Jaw: Swelling (right  jaw) and pain on movement (right jaw) present.     Right Ear: Hearing, tympanic membrane, ear canal and external ear normal.     Left Ear: Hearing, tympanic membrane, ear canal and external ear normal.     Nose: Nose normal.     Right Turbinates: Not enlarged or swollen.     Left Turbinates: Not enlarged or swollen.     Right Sinus: No maxillary sinus tenderness or frontal sinus tenderness.     Left Sinus: No maxillary sinus tenderness or frontal sinus tenderness.   Eyes:     Extraocular Movements: Extraocular movements intact.     Pupils: Pupils are equal, round, and reactive to light.    Cardiovascular:     Rate and Rhythm: Normal rate and regular rhythm.     Pulses: Normal pulses.     Heart sounds: Normal heart sounds.  Pulmonary:     Effort: Pulmonary effort is normal.     Breath sounds: Normal breath sounds.  Abdominal:     Palpations: Abdomen is soft.     Tenderness: There is no abdominal tenderness.   Musculoskeletal:     Cervical back: Normal range of motion.   Skin:    General: Skin is warm and dry.   Neurological:     General: No focal deficit present.     Mental Status: She is alert and oriented to person, place, and time.   Psychiatric:        Mood and Affect: Mood normal.        Behavior: Behavior normal.      UC Treatments / Results  Labs (all labs ordered are listed, but only abnormal results are displayed) Labs Reviewed - No data to display  EKG   Radiology No results found.  Procedures Procedures (including critical care time)  Medications Ordered in UC Medications - No data to display  Initial Impression / Assessment and Plan / UC Course  I have reviewed the triage vital signs and the nursing notes.  Pertinent labs & imaging  results that were available during my care of the patient were reviewed by me and considered in my medical decision making (see chart for details).  Will treat patient for dental abscess with clindamycin 300 mg for  the next 7 days.  Supportive care recommendations were provided and discussed with the patient to include continuing over-the-counter Tylenol , warm salt water gargles, and the use of ice or heat as needed.  Patient was given a dental resource guide to schedule an appointment with dentist for follow-up.  Patient was given strict follow-up precautions.  Patient was in agreement with this plan of care and verbalizes understanding.  All questions were answered.  Patient stable for discharge.  Final Clinical Impressions(s) / UC Diagnoses   Final diagnoses:  None   Discharge Instructions   None    ED Prescriptions   None    PDMP not reviewed this encounter.   Hardy Lia, NP 01/13/24 1405

## 2024-02-06 ENCOUNTER — Encounter: Admitting: Family Medicine

## 2024-02-21 ENCOUNTER — Encounter (HOSPITAL_COMMUNITY): Payer: Self-pay | Admitting: *Deleted

## 2024-02-21 ENCOUNTER — Emergency Department (HOSPITAL_COMMUNITY)
Admission: EM | Admit: 2024-02-21 | Discharge: 2024-02-22 | Disposition: A | Discharge status: Home / Self Care | Source: Home / Self Care | Attending: Emergency Medicine | Admitting: Emergency Medicine

## 2024-02-21 ENCOUNTER — Encounter (HOSPITAL_COMMUNITY): Payer: Self-pay | Admitting: Emergency Medicine

## 2024-02-21 ENCOUNTER — Emergency Department (HOSPITAL_COMMUNITY)
Admission: EM | Admit: 2024-02-21 | Discharge: 2024-02-21 | Discharge status: Against Medical Advice | Attending: Emergency Medicine | Admitting: Emergency Medicine

## 2024-02-21 ENCOUNTER — Emergency Department (HOSPITAL_COMMUNITY)

## 2024-02-21 ENCOUNTER — Other Ambulatory Visit: Payer: Self-pay

## 2024-02-21 DIAGNOSIS — W1839XA Other fall on same level, initial encounter: Secondary | ICD-10-CM | POA: Diagnosis not present

## 2024-02-21 DIAGNOSIS — S63502A Unspecified sprain of left wrist, initial encounter: Secondary | ICD-10-CM | POA: Insufficient documentation

## 2024-02-21 DIAGNOSIS — W06XXXA Fall from bed, initial encounter: Secondary | ICD-10-CM | POA: Insufficient documentation

## 2024-02-21 DIAGNOSIS — S6992XA Unspecified injury of left wrist, hand and finger(s), initial encounter: Secondary | ICD-10-CM | POA: Diagnosis not present

## 2024-02-21 DIAGNOSIS — M25532 Pain in left wrist: Secondary | ICD-10-CM | POA: Insufficient documentation

## 2024-02-21 DIAGNOSIS — Z5321 Procedure and treatment not carried out due to patient leaving prior to being seen by health care provider: Secondary | ICD-10-CM | POA: Insufficient documentation

## 2024-02-21 DIAGNOSIS — M79642 Pain in left hand: Secondary | ICD-10-CM | POA: Diagnosis not present

## 2024-02-21 NOTE — ED Provider Triage Note (Signed)
 Emergency Medicine Provider Triage Evaluation Note  Julie Case , a 33 y.o. female  was evaluated in triage.  Pt complains of left wrist and hand pain after falling on an outstretched arm.  Review of Systems  Positive: Hand pain Negative: HA  Physical Exam  BP (!) 143/95 (BP Location: Right Arm)   Pulse 86   Temp 98.4 F (36.9 C)   Resp 14   Ht 5' 4 (1.626 m)   Wt 93.4 kg   LMP 02/21/2024   SpO2 95%   BMI 35.34 kg/m  Gen:   Awake, no distress   Resp:  Normal effort  MSK:   Moves extremities without difficulty  Other:    Medical Decision Making  Medically screening exam initiated at 8:14 PM.  Appropriate orders placed.  Diasha Castleman was informed that the remainder of the evaluation will be completed by another provider, this initial triage assessment does not replace that evaluation, and the importance of remaining in the ED until their evaluation is complete.     Shermon Warren SAILOR, PA-C 02/21/24 2015

## 2024-02-21 NOTE — ED Triage Notes (Signed)
 Patient arrives POV c/o injury to left wrist. Patient reports falling last night and ended up putting full body weight on left wrist. Patient reports hearing a popping sound and states that it is painful to move hand/fingers. Patient states she did not take any medication for the pain; states she cannot take NSAIDs d/t GI upset. Pain is 6/10.

## 2024-02-21 NOTE — ED Provider Notes (Signed)
   [place] [facility]  Provider Note  CSN: [id number] Arrival date & time: 02/21/24 2215  History Chief Complaint  Patient presents with   Hand Injury    Julie Case is a 33 y.o. female reports she was out of town and stopped at a motel on her way back, she was jumping to get into bed when she fell onto her L wrist, heard a pop and has had persistent pain with ROM since then. She stopped at [facility] on the way home, where she had xrays, but left due to the wait times.    Home Medications Prior to Admission medications   Medication Sig Start Date End Date Taking? Authorizing Provider  NIFEdipine  (PROCARDIA  XL/NIFEDICAL XL) 60 MG 24 hr tablet Take 1 tablet (60 mg total) by mouth daily. 12/02/23   Kizzie Suzen SAUNDERS, CNM  PARoxetine  (PAXIL ) 30 MG tablet Take 1 tablet (30 mg total) by mouth daily. 12/19/23   Zarwolo, Gloria, FNP     Allergies    Penicillins, Naproxen, and Nsaids   Review of Systems   Review of Systems Please see HPI for pertinent positives and negatives  Physical Exam BP (!) 144/100   Pulse 81   Temp (!) 97 F (36.1 C) (Temporal)   Resp 18   Ht 5' 4 (1.626 m)   Wt 93 kg   LMP 02/21/2024   SpO2 99%   BMI 35.19 kg/m   Physical Exam Vitals and nursing note reviewed.  HENT:     Head: Normocephalic.     Nose: Nose normal.  Eyes:     Extraocular Movements: Extraocular movements intact.  Pulmonary:     Effort: Pulmonary effort is normal.  Musculoskeletal:        General: Tenderness (diffuse left wrist/hand, +snuffbox tenderness) present. No swelling or deformity. Normal range of motion.     Cervical back: Neck supple.  Skin:    Findings: No rash (on exposed skin).  Neurological:     Mental Status: She is alert and oriented to person, place, and time.  Psychiatric:        Mood and Affect: Mood normal.     ED Results / Procedures / Treatments   EKG None  Procedures Procedures  Medications Ordered in the  ED Medications - No data to display  Initial Impression and Plan  Patient here with L wrist pain after she fell on top of it on a bed. I personally viewed the images from radiology studies and agree with radiologist interpretation: Xrays are neg for fracture. Given snuffbox tenderness, possible occult scaphoid fracture although that is not where she indicates her most severe pain is. Will place in wrist brace, recommend ice, elevation, APAP for pain. Hand follow up if not improving.   ED Course       MDM Rules/Calculators/A&P Medical Decision Making Problems Addressed: Wrist sprain, left, initial encounter: acute illness or injury  Amount and/or Complexity of Data Reviewed Radiology: independent interpretation performed. Decision-making details documented in ED Course.  Risk OTC drugs.     Final Clinical Impression(s) / ED Diagnoses Final diagnoses:  Wrist sprain, left, initial encounter    Rx / DC Orders ED Discharge Orders     None        Roselyn Carlin NOVAK, MD 02/22/24 0000

## 2024-02-21 NOTE — ED Triage Notes (Signed)
 Pt c/o left wrist pain and injury after breaking a fall, pt reports she was at [facility] but left d/t wait times

## 2024-02-22 NOTE — ED Notes (Signed)
 Pt/family received d/c paperwork at this time. After going over the paperwork any questions, comments, or concerns were answered to the best of this nurse's knowledge. The pt/family verbally acknowledged the teachings/instructions.

## 2024-03-03 ENCOUNTER — Ambulatory Visit (INDEPENDENT_AMBULATORY_CARE_PROVIDER_SITE_OTHER): Admitting: Orthopedic Surgery

## 2024-03-03 ENCOUNTER — Encounter: Payer: Self-pay | Admitting: Orthopedic Surgery

## 2024-03-03 ENCOUNTER — Other Ambulatory Visit (INDEPENDENT_AMBULATORY_CARE_PROVIDER_SITE_OTHER): Payer: Self-pay

## 2024-03-03 VITALS — BP 137/91 | HR 77 | Ht 64.0 in | Wt 205.0 lb

## 2024-03-03 DIAGNOSIS — M25532 Pain in left wrist: Secondary | ICD-10-CM

## 2024-03-03 MED ORDER — PREDNISONE 10 MG (21) PO TBPK: ORAL_TABLET | ORAL | 0 refills | Status: DC

## 2024-03-03 NOTE — Patient Instructions (Signed)
Wrist Fracture Rehab Ask your health care provider which exercises are safe for you. Do exercises exactly as told by your health care provider and adjust them as directed. It is normal to feel mild stretching, pulling, tightness, or discomfort as you do these exercises. Stop right away if you feel sudden pain or your pain gets worse. Do not begin these exercises until told by your health care provider. Stretching and range-of-motion exercises These exercises warm up your muscles and joints and improve the movement and flexibility of your wrist and hand. These exercises also help to relieve pain,numbness, and tingling. Finger flexion and extension Sit or stand with your elbow at your side. Open and stretch your left / right fingers as wide as you can (extension). Hold this position for 10 seconds. Close your left / right fingers into a gentle fist (flexion). Hold this position for 10 seconds. Slowly return to the starting position. Repeat 10 times. Complete this exercise 1-2 times a day. Wrist flexion Bend your left / right elbow to a 90-degree angle (right angle) with your palm facing the floor. Bend your wrist forward so your fingers point toward the floor (flexion). Hold this position for 10 seconds. Slowly return to the starting position. Repeat 10 times. Complete this exercise 1-2 times a day. Wrist extension Bend your left / right elbow to a 90-degree angle (right angle) with your palm facing the floor. Bend your wrist backward so your fingers point toward the ceiling (extension). Hold this position for 10 seconds. Slowly return to the starting position. Repeat 10 times. Complete this exercise 1-2 times a day. Ulnar deviation Bend your left / right elbow to a 90-degree angle (right angle), and rest your forearm on a table with your palm facing down. Keeping your hand flat on the table, bend your left / right wrist toward your small finger (pinkie). This is ulnar deviation. Hold this  position for 10 seconds. Slowly return to the starting position. Repeat 10 times. Complete this exercise 1-2 times a day. Radial deviation Bend your left / right elbow to a 90-degree angle (right angle), and rest your forearm on a table with your palm facing down. Keeping your hand flat on the table, bend your left / right wrist toward your thumb. This is radial deviation. Hold this position for 10 seconds. Slowly return to the starting position. Repeat 10 times. Complete this exercise 1-2 times a day. Forearm rotation, supination Stand or sit with your left / right elbow bent to a 90-degree angle (right angle) at your side. Position your forearm so that the thumb is facing the ceiling (neutral position). Turn (rotate) your palm up toward the ceiling (supination), stopping when you feel a gentle stretch. Hold this position for 10 seconds. Slowly return to the starting position. Repeat 10 times. Complete this exercise 1-2 times a day. Forearm rotation, pronation Stand or sit with your left / right elbow bent to a 90-degree angle (right angle) at your side. Position your forearm so that the thumb is facing the ceiling (neutral position). Turn (rotate) your palm down toward the floor (pronation), stopping when you feel a gentle stretch. Hold this position for 10 seconds. Slowly return to the starting position. Repeat 10 times. Complete this exercise 1-2 times a day. Wrist flexion stretch  Extend your left / right arm in front of you and turn your palm down toward the floor. If told by your health care provider, bend your left / right arm to a 90-degree angle (  right angle) at your side. Using your uninjured hand, gently press over the back of your left / right hand to bend your wrist and fingers toward the floor (flexion). Go as far as you can to feel a stretch without causing pain. Hold this position for 10 seconds. Slowly return to the starting position. Repeat 10 times. Complete this  exercise 1-2 times a day. Wrist extension stretch  Extend your left / right arm in front of you and turn your palm up toward the ceiling. If told by your health care provider, bend your left / right arm to a 90-degree angle (right angle) at your side. Using your uninjured hand, gently press over the palm of your left / right hand to bend your wrist and fingers toward the floor (extension). Go as far as you can to feel a stretch without causing pain. Hold this position for 10 seconds. Slowly return to the starting position. Repeat 10 times. Complete this exercise 1-2 times a day. Forearm rotation stretch, supination Stand or sit with your arms at your sides. Bend your left / right elbow to a 90-degree angle (right angle). Using your uninjured hand, turn your left / right palm up toward the ceiling (assisted supination) until you feel a gentle stretch in the inside of your forearm. Hold this position for 10 seconds. Slowly return to the starting position. Repeat 10 times. Complete this exercise 1-2 times a day. Forearm rotation stretch, pronation Stand or sit with your arms at your sides. Bend your left / right elbow to a 90-degree angle (right angle). Using your uninjured hand, turn your left / right palm down toward the floor (assisted pronation) until you feel a gentle stretch in the top of your forearm. Hold this position for 10 seconds. Slowly return to the starting position. Repeat 10 times. Complete this exercise 1-2 times a day. Strengthening exercises These exercises build strength and endurance in your wrist and hand. Enduranceis the ability to use your muscles for a long time, even after they get tired. Wrist flexion Sit with your left / right forearm supported on a table. Your elbow should be at waist height. Rest your hand over the edge of the table, palm up. Gently grasp a 5 lb / kg weight (can of soup). Or, hold an exercise band or tube in both hands, keeping your hands at  the same level and hip distance apart. There should be slight tension in the exercise band or tube. Without moving your forearm or elbow, slowly bend your wrist up toward the ceiling (wrist flexion). Hold this position for 10 seconds. Slowly return to the starting position. Repeat 10 times. Complete this exercise 1-2 times a day. Wrist extension Sit with your left / right forearm supported on a table. Your elbow should be at waist height. Rest your hand over the edge of the table, palm down. Gently grasp a 5 lb / kg weight. Or, hold an exercise band or tube in both hands, keeping your hands at the same level and hip distance apart. There should be slight tension in the exercise band or tube. Without moving your forearm or elbow, slowly curl your hand up toward the ceiling (extension). Hold this position for 10 seconds. Slowly return to the starting position. Repeat 10 times. Complete this exercise 1-2 times a day. Forearm rotation, supination  Sit with your left / right forearm supported on a table. Your elbow should be at waist height. Rest your hand over the edge of the  table, palm down. Gently grasp a lightweight hammer near the head. As this exercise gets easier for you, try holding the hammer farther down the handle. Without moving your elbow, slowly turn (rotate) your palm up toward the ceiling (supination). Hold this position for 10 seconds. Slowly return to the starting position. Repeat 10 times. Complete this exercise 1-2 times a day. Forearm rotation, pronation  Sit with your left / right forearm supported on a table. Your elbow should be at waist height. Rest your hand over the edge of the table, palm up. Gently grasp a lightweight hammer near the head. As this exercise gets easier for you, try holding the hammer farther down the handle. Without moving your elbow, slowly turn (rotate) your palm down toward the floor (pronation). Hold this position for 10 seconds. Slowly return  to the starting position. Repeat 10 times. Complete this exercise 1-2 times a day. Grip strengthening  Hold one of these items in your left / right hand: a dense sponge, a stress ball, or a large, rolled sock. Slowly squeeze the object as hard as you can without increasing any pain. Hold your squeeze for 10 seconds. Slowly release your grip. Repeat 10 times. Complete this exercise 1-2 times a day. This information is not intended to replace advice given to you by your health care provider. Make sure you discuss any questions you have with your healthcare provider. Document Revised: 11/26/2019 Document Reviewed: 11/26/2019 Elsevier Patient Education  [employer].

## 2024-03-03 NOTE — Progress Notes (Signed)
 Orthopaedic Clinic Return  Assessment: Julie Case is a 33 y.o. female with the following: Left wrist sprain  Plan: Ms. Terrance fell on outstretched left hand a couple weeks ago.  She continues to have a lot of pain in the wrist.  On exam, there is minimal swelling and no bruising.  Repeat radiographs today are without acute injury.  No issues with alignment.  No fracture of the scaphoid.  She may have sprained her wrist, and it is taking a while to improve.  I recommended a short course of steroids.  She can continue to use the brace.  I encouraged her to work on her range of motion and strength.  Anticipate gradual resolution.  If she continues to have issues, we could consider a referral to physical therapy, or a hand specialist.  She states understanding.  She will follow-up as needed.    Meds ordered this encounter  Medications   predniSONE  (STERAPRED UNI-PAK 21 TAB) 10 MG (21) TBPK tablet    Sig: 10 mg DS 12 as directed    Dispense:  48 tablet    Refill:  0    Body mass index is 35.19 kg/m.  Follow-up: Return if symptoms worsen or fail to improve.   Subjective:  Chief Complaint  Patient presents with   Wrist Injury    Left 02/21/24 pain volar and dorsal wrist into thumb into forearm, entire wrist painful     History of Present Illness: Julie Case is a 33 y.o. female who returns to clinic for evaluation of left wrist pain.  She is left-hand dominant.  A couple of weeks ago, she was traveling, when she fell off of bed.  She landed on an outstretched left wrist.  She was evaluated at that time.  Radiographs were negative for acute fracture.  She has been wearing a brace.  She continues to have a lot of pain in the wrist.  The pain radiates proximally.  She states that she has been removing the brace to work on range of motion.  She is not taking medicines on a regular basis.  She cannot take NSAIDs.  She has been using Biofreeze, especially at nighttime.  Review of Systems: No  fevers or chills No numbness or tingling No chest pain No shortness of breath No bowel or bladder dysfunction No GI distress No headaches   Objective: BP (!) 137/91   Pulse 77   Ht 5' 4 (1.626 m)   Wt 205 lb (93 kg)   LMP 02/21/2024   BMI 35.19 kg/m   Physical Exam:  Alert and oriented.  No acute distress.  Evaluation of left wrist demonstrates no swelling.  There is no bruising.  No redness.  She does have tenderness within the snuffbox.  Pain with radial deviation.  She tolerates some extension, but has pain.  She is able to make a fist.  Fingers are warm and well-perfused.  Sensation intact throughout the left hand.  IMAGING: I personally ordered and reviewed the following images:  X-rays of the left wrist, including views of the scaphoid were obtained in clinic today.  These are compared to prior x-rays.  No acute injuries are noted.  No fracture through the scaphoid.  No dislocation.  Minimal degenerative changes.  No bony lesions.  Normal overall alignment  Impression: Negative left wrist x-ray   Oneil DELENA Horde, MD 03/03/2024 11:24 AM

## 2024-04-20 ENCOUNTER — Ambulatory Visit: Admitting: Family Medicine

## 2024-04-27 ENCOUNTER — Ambulatory Visit
Admission: RE | Admit: 2024-04-27 | Discharge: 2024-04-27 | Disposition: A | Discharge status: Home / Self Care | Attending: Family Medicine | Admitting: Family Medicine

## 2024-04-27 VITALS — BP 118/77 | HR 87 | Temp 98.3°F | Resp 16

## 2024-04-27 DIAGNOSIS — M545 Low back pain, unspecified: Secondary | ICD-10-CM | POA: Diagnosis not present

## 2024-04-27 MED ORDER — TIZANIDINE HCL 4 MG PO CAPS: 4.0000 mg | ORAL_CAPSULE | Freq: Three times a day (TID) | ORAL | 0 refills | Status: DC

## 2024-04-27 MED ORDER — KETOROLAC TROMETHAMINE 30 MG/ML IJ SOLN
30.0000 mg | Freq: Once | INTRAMUSCULAR | Status: AC
  Administered 2024-04-27: 30 mg via INTRAMUSCULAR

## 2024-04-27 NOTE — ED Provider Notes (Signed)
 [facility]    CSN: [id number] Arrival date & time: 04/27/24  1339      History   Chief Complaint Chief Complaint  Patient presents with   Back Pain    I have arthritis in my back and I feel like it's gotten worse I barely can walk - Entered by patient    HPI Providencia Hottenstein is a 33 y.o. female.   Presenting today with 4-day history of progressively worsening stiffness, lumbar midline pain, difficulty getting out of bed in the morning.  States was diagnosed 10 years ago with arthritis in her back and thinks it is flaring up.  No new injury, new heavy lifting, radiation of pain down legs, numbness tingling or weakness, bowel or bladder incontinence, saddle anesthesia, fevers, urinary symptoms.  Tried Tylenol  and heating pads with no relief.    Past Medical History:  Diagnosis Date   Depression    Epilepsy (HCC)    Pregnancy induced hypertension     Patient Active Problem List   Diagnosis Date Noted   Encounter for general adult medical examination with abnormal findings 12/19/2023   Left lumbar pain 11/27/2023   H/O tubal ligation 10/07/2023   History of gestational hypertension 10/06/2023   History of seizure disorder 04/17/2023   Depression 01/24/2022    Past Surgical History:  Procedure Laterality Date   CHOLECYSTECTOMY     ECTOPIC PREGNANCY SURGERY     with right tube removal   TUBAL LIGATION N/A 10/07/2023   Procedure: LIGATION, FALLOPIAN TUBE, POSTPARTUM;  Surgeon: Barbra Lang PARAS, DO;  Location: [facility] LD [facility]S;  Service: Gynecology;  Laterality: N/A;    OB History     Gravida  9   Para  6   Term  6   Preterm  0   AB  3   Living  6      SAB  2   IAB  0   Ectopic  1   Multiple  0   Live Births  6            Home Medications    Prior to Admission medications   Medication Sig Start Date End Date Taking? Authorizing Provider  PARoxetine  (PAXIL ) 30 MG tablet Take 1 tablet (30 mg total) by mouth daily. 12/19/23  Yes Zarwolo,  Gloria, FNP  tiZANidine  (ZANAFLEX ) 4 MG capsule Take 1 capsule (4 mg total) by mouth 3 (three) times daily. Do not drink alcohol or drive while taking this medication.  May cause drowsiness. 04/27/24  Yes Stuart Vernell Norris, PA-C  NIFEdipine  (PROCARDIA  XL/NIFEDICAL XL) 60 MG 24 hr tablet Take 1 tablet (60 mg total) by mouth daily. 12/02/23   Kizzie Suzen SAUNDERS, CNM  predniSONE  (STERAPRED UNI-PAK 21 TAB) 10 MG (21) TBPK tablet 10 mg DS 12 as directed 03/03/24   Onesimo Oneil LABOR, MD    Family History Family History  Problem Relation Age of Onset   Diabetes Maternal Grandmother    Diabetes Maternal Grandfather    Hypertension Father    Hypercholesterolemia Father    Hypertension Mother    Hypercholesterolemia Mother    Schizophrenia Brother    Hypertension Other    Diabetes Other     Social History Social History   Tobacco Use   Smoking status: Former    Current packs/day: 0.50    Types: Cigarettes   Smokeless tobacco: Never   Tobacco comments:    1/2 pack a day  Vaping Use   Vaping status: Some Days  Substance Use Topics   Alcohol use: Not Currently   Drug use: Not Currently     Allergies   Penicillins, Naproxen, and Nsaids   Review of Systems Review of Systems PER HPI  Physical Exam Triage Vital Signs ED Triage Vitals [04/27/24 1432]  Encounter Vitals Group     BP 118/77     Girls Systolic BP Percentile      Girls Diastolic BP Percentile      Boys Systolic BP Percentile      Boys Diastolic BP Percentile      Pulse Rate 87     Resp 16     Temp 98.3 F (36.8 C)     Temp Source Oral     SpO2 97 %     Weight      Height      Head Circumference      Peak Flow      Pain Score 7     Pain Loc      Pain Education      Exclude from Growth Chart    No data found.  Updated Vital Signs BP 118/77 (BP Location: Right Arm)   Pulse 87   Temp 98.3 F (36.8 C) (Oral)   Resp 16   LMP 04/13/2024 (Exact Date)   SpO2 97%   Breastfeeding No   Visual Acuity Right  Eye Distance:   Left Eye Distance:   Bilateral Distance:    Right Eye Near:   Left Eye Near:    Bilateral Near:     Physical Exam Vitals and nursing note reviewed.  Constitutional:      Appearance: Normal appearance. She is not ill-appearing.  HENT:     Head: Atraumatic.     Mouth/Throat:     Mouth: Mucous membranes are moist.  Eyes:     Extraocular Movements: Extraocular movements intact.     Conjunctiva/sclera: Conjunctivae normal.  Cardiovascular:     Rate and Rhythm: Normal rate.  Pulmonary:     Effort: Pulmonary effort is normal.  Musculoskeletal:        General: Tenderness present. No swelling or signs of injury. Normal range of motion.     Cervical back: Normal range of motion and neck supple.     Comments: Lumbar midline tenderness to palpation with no deformity, mild lumbar paraspinal musculature tenderness to palpation.  No midline spinal tenderness to palpation otherwise.  Negative straight leg raise bilateral lower extremities.  Normal gait and range of motion  Skin:    General: Skin is warm and dry.  Neurological:     Mental Status: She is alert and oriented to person, place, and time.     Motor: No weakness.     Gait: Gait normal.     Comments: Bilateral lower extremities neurovascularly intact  Psychiatric:        Mood and Affect: Mood normal.        Thought Content: Thought content normal.        Judgment: Judgment normal.      UC Treatments / Results  Labs (all labs ordered are listed, but only abnormal results are displayed) Labs Reviewed - No data to display  EKG   Radiology No results found.  Procedures Procedures (including critical care time)  Medications Ordered in UC Medications  ketorolac  (TORADOL ) 30 MG/ML injection 30 mg (has no administration in time range)    Initial Impression / Assessment and Plan / UC Course  I have reviewed the triage vital  signs and the nursing notes.  Pertinent labs & imaging results that were  available during my care of the patient were reviewed by me and considered in my medical decision making (see chart for details).     Vital signs and exam very reassuring, no red flag findings.  Consistent with arthritis flare, acute on chronic issue.  Treat with IM Toradol  as she has GI upset with oral NSAIDs, Zanaflex , heat, stage, stretches, rest.  Return for worsening symptoms.  Final Clinical Impressions(s) / UC Diagnoses   Final diagnoses:  Lumbar pain   Discharge Instructions   None    ED Prescriptions     Medication Sig Dispense Auth. Provider   tiZANidine  (ZANAFLEX ) 4 MG capsule Take 1 capsule (4 mg total) by mouth 3 (three) times daily. Do not drink alcohol or drive while taking this medication.  May cause drowsiness. 15 capsule Stuart Vernell Norris, [place]      PDMP not reviewed this encounter.   Stuart Vernell Norris, PA-C 04/27/24 1448

## 2024-04-27 NOTE — ED Triage Notes (Signed)
 Lower back pain x 4 days. Tried tylenol  arthritis and heating pad with no relief of symptoms.

## 2024-07-03 ENCOUNTER — Encounter: Payer: Self-pay | Admitting: Family Medicine

## 2024-07-03 ENCOUNTER — Ambulatory Visit: Admitting: Family Medicine

## 2024-07-03 VITALS — BP 118/77 | HR 74 | Resp 16 | Ht 64.0 in | Wt 227.0 lb

## 2024-07-03 DIAGNOSIS — E559 Vitamin D deficiency, unspecified: Secondary | ICD-10-CM

## 2024-07-03 DIAGNOSIS — L2089 Other atopic dermatitis: Secondary | ICD-10-CM | POA: Diagnosis not present

## 2024-07-03 DIAGNOSIS — E782 Mixed hyperlipidemia: Secondary | ICD-10-CM

## 2024-07-03 DIAGNOSIS — E038 Other specified hypothyroidism: Secondary | ICD-10-CM

## 2024-07-03 DIAGNOSIS — R7301 Impaired fasting glucose: Secondary | ICD-10-CM | POA: Diagnosis not present

## 2024-07-03 DIAGNOSIS — L209 Atopic dermatitis, unspecified: Secondary | ICD-10-CM | POA: Insufficient documentation

## 2024-07-03 MED ORDER — TRIAMCINOLONE ACETONIDE 0.1 % EX OINT: 1.0000 | TOPICAL_OINTMENT | Freq: Two times a day (BID) | CUTANEOUS | 0 refills | Status: AC

## 2024-07-03 NOTE — Patient Instructions (Addendum)
 I appreciate the opportunity to provide care to you today!    Follow up:  5 months  Labs: please stop by the lab during the week to get your blood drawn (CBC, CMP, TSH, Lipid profile, HgA1c, Vit D)  Atopic Dermatitis of the Palm  -Apply 1-2 times daily for up to 2-4 weeks. -You may consider wearing cotton gloves for a few hours after application to enhance absorption. -Avoid prolonged continuous use to reduce risk of skin atrophy.  Moisturizers (Essential Component) -Frequent moisturization is critical. -Thick emollients: petrolatum, CeraVe Healing Ointment, Eucerin, Aquaphor -Apply after every handwash and at bedtime -Use fragrance-free products   Please follow up if your symptoms worsen or fail to improve.    Please continue to a heart-healthy diet and increase your physical activities. Try to exercise for at least five days a week.    It was a pleasure to see you and I look forward to continuing to work together on your health and well-being. Please do not hesitate to call the office if you need care or have questions about your care.  In case of emergency, please visit the Emergency Department for urgent care, or contact our clinic at [phone] to schedule an appointment. We're here to help you!   Have a wonderful day and week. With Gratitude, Meade JENEANE Gerlach MSN, FNP-BC, PMHNP-BC

## 2024-07-03 NOTE — Assessment & Plan Note (Signed)
-  Encouraged to apply kenalog  ointment 1-2 times daily for up to 2-4 weeks. -You may consider wearing cotton gloves for a few hours after application to enhance absorption. -Avoid prolonged continuous use to reduce risk of skin atrophy.  Moisturizers (Essential Component) -Frequent moisturization is critical. -Thick emollients: petrolatum, CeraVe Healing Ointment, Eucerin, Aquaphor -Apply after every handwash and at bedtime -Use fragrance-free products

## 2024-07-03 NOTE — Progress Notes (Signed)
 Established Patient Office Visit  Subjective:  Patient ID: Julie Case, female    DOB: August 03, 1990  Age: 33 y.o. MRN: [id number]  CC:  Chief Complaint  Patient presents with   Rash    Has been having breakouts of a very itchy rash scaly rash on her hands and she noticed it started when she was pregnant    HPI Julie Case is a 33 y.o. female with past medical history of left lumbar pain presents for f/u of  chronic medical conditions.   Atopic Dermatitis: The patient reports recurrent breakouts of a very itchy, scaly rash on the left palm that began during her pregnancy. She states the rash comes and goes. She has been applying triple antibiotic cream and a bacterial spray, but symptoms persist. The rash is extremely itchy, and she notes it flares periodically before improving briefly and then returning.  Past Medical History:  Diagnosis Date   Depression    Epilepsy (HCC)    Pregnancy induced hypertension     Past Surgical History:  Procedure Laterality Date   CHOLECYSTECTOMY     ECTOPIC PREGNANCY SURGERY     with right tube removal   TUBAL LIGATION N/A 10/07/2023   Procedure: LIGATION, FALLOPIAN TUBE, POSTPARTUM;  Surgeon: Barbra Lang PARAS, DO;  Location: [facility];  Service: Gynecology;  Laterality: N/A;    Family History  Problem Relation Age of Onset   Diabetes Maternal Grandmother    Diabetes Maternal Grandfather    Hypertension Father    Hypercholesterolemia Father    Hypertension Mother    Hypercholesterolemia Mother    Schizophrenia Brother    Hypertension Other    Diabetes Other     Social History   Socioeconomic History   Marital status: Single    Spouse name: Not on file   Number of children: Not on file   Years of education: Not on file   Highest education level: 11th grade  Occupational History   Not on file  Tobacco Use   Smoking status: Former    Current packs/day: 0.50    Types: Cigarettes   Smokeless tobacco: Never   Tobacco comments:     1/2 pack a day  Vaping Use   Vaping status: Some Days  Substance and Sexual Activity   Alcohol use: Not Currently   Drug use: Not Currently   Sexual activity: Not Currently    Birth control/protection: Surgical    Comment: tubal  Other Topics Concern   Not on file  Social History Narrative   Not on file   Social Drivers of Health   Financial Resource Strain: Low Risk  (06/29/2024)   Overall Financial Resource Strain (CARDIA)    Difficulty of Paying Living Expenses: Not hard at all  Food Insecurity: No Food Insecurity (06/29/2024)   Hunger Vital Sign    Worried About Running Out of Food in the Last Year: Never true    Ran Out of Food in the Last Year: Never true  Transportation Needs: No Transportation Needs (06/29/2024)   PRAPARE - Administrator, Civil Service (Medical): No    Lack of Transportation (Non-Medical): No  Physical Activity: Insufficiently Active (06/29/2024)   Exercise Vital Sign    Days of Exercise per Week: 2 days    Minutes of Exercise per Session: 20 min  Stress: No Stress Concern Present (06/29/2024)   [employer] of Occupational Health - Occupational Stress Questionnaire    Feeling of Stress: Only a little  Social Connections: Moderately Isolated (06/29/2024)   Social Connection and Isolation Panel    Frequency of Communication with Friends and Family: Once a week    Frequency of Social Gatherings with Friends and Family: Once a week    Attends Religious Services: 1 to 4 times per year    Active Member of [employer] or Organizations: No    Attends Engineer, Structural: Not on file    Marital Status: Living with partner  Intimate Partner Violence: Not At Risk (10/06/2023)   Humiliation, Afraid, Rape, and Kick questionnaire    Fear of Current or Ex-Partner: No    Emotionally Abused: No    Physically Abused: No    Sexually Abused: No    Outpatient Medications Prior to Visit  Medication Sig Dispense Refill   PARoxetine  (PAXIL ) 30 MG  tablet Take 1 tablet (30 mg total) by mouth daily. 90 tablet 1   NIFEdipine  (PROCARDIA  XL/NIFEDICAL XL) 60 MG 24 hr tablet Take 1 tablet (60 mg total) by mouth daily. (Patient not taking: Reported on 07/03/2024) 30 tablet 0   tiZANidine  (ZANAFLEX ) 4 MG capsule Take 1 capsule (4 mg total) by mouth 3 (three) times daily. Do not drink alcohol or drive while taking this medication.  May cause drowsiness. (Patient not taking: Reported on 07/03/2024) 15 capsule 0   predniSONE  (STERAPRED UNI-PAK 21 TAB) 10 MG (21) TBPK tablet 10 mg DS 12 as directed 48 tablet 0   No facility-administered medications prior to visit.    Allergies  Allergen Reactions   Penicillins Shortness Of Breath and Rash   Naproxen Nausea And Vomiting   Nsaids Nausea And Vomiting    ROS Review of Systems  Constitutional:  Negative for chills and fever.  Eyes:  Negative for visual disturbance.  Respiratory:  Negative for chest tightness and shortness of breath.   Skin:  Positive for rash.  Neurological:  Negative for dizziness and headaches.      Objective:    Physical Exam HENT:     Head: Normocephalic.     Mouth/Throat:     Mouth: Mucous membranes are moist.  Cardiovascular:     Rate and Rhythm: Normal rate.     Heart sounds: Normal heart sounds.  Pulmonary:     Effort: Pulmonary effort is normal.     Breath sounds: Normal breath sounds.  Neurological:     Mental Status: She is alert.     BP 118/77   Pulse 74   Resp 16   Ht 5' 4 (1.626 m)   Wt 227 lb (103 kg)   SpO2 97%   BMI 38.96 kg/m  Wt Readings from Last 3 Encounters:  07/03/24 227 lb (103 kg)  03/03/24 205 lb (93 kg)  02/21/24 205 lb (93 kg)    Lab Results  Component Value Date   TSH 1.470 03/14/2023   Lab Results  Component Value Date   WBC 6.7 12/01/2023   HGB 13.2 12/01/2023   HCT 37.4 12/01/2023   MCV 83.1 12/01/2023   PLT 241 12/01/2023   Lab Results  Component Value Date   NA 136 12/01/2023   K 3.6 12/01/2023   CO2 25  12/01/2023   GLUCOSE 97 12/01/2023   BUN 10 12/01/2023   CREATININE 0.54 12/01/2023   BILITOT 0.7 12/01/2023   ALKPHOS 84 12/01/2023   AST 12 (L) 12/01/2023   ALT 11 12/01/2023   PROT 6.7 12/01/2023   ALBUMIN 3.6 12/01/2023   CALCIUM 9.1 12/01/2023  ANIONGAP 7 12/01/2023   EGFR 123 03/14/2023   Lab Results  Component Value Date   CHOL 185 03/14/2023   Lab Results  Component Value Date   HDL 40 03/14/2023   Lab Results  Component Value Date   LDLCALC 121 (H) 03/14/2023   Lab Results  Component Value Date   TRIG 132 03/14/2023   Lab Results  Component Value Date   CHOLHDL 4.6 (H) 03/14/2023   Lab Results  Component Value Date   HGBA1C 5.2 03/14/2023      Assessment & Plan:  Other atopic dermatitis Assessment & Plan: -Encouraged to apply kenalog  ointment 1-2 times daily for up to 2-4 weeks. -You may consider wearing cotton gloves for a few hours after application to enhance absorption. -Avoid prolonged continuous use to reduce risk of skin atrophy.  Moisturizers (Essential Component) -Frequent moisturization is critical. -Thick emollients: petrolatum, CeraVe Healing Ointment, Eucerin, Aquaphor -Apply after every handwash and at bedtime -Use fragrance-free products  Orders: -     Triamcinolone  Acetonide; Apply 1 Application topically 2 (two) times daily.  Dispense: 30 g; Refill: 0  IFG (impaired fasting glucose) -     Hemoglobin A1c  Vitamin D  deficiency -     VITAMIN D  25 Hydroxy (Vit-D Deficiency, Fractures)  TSH (thyroid -stimulating hormone deficiency) -     TSH + free T4  Mixed hyperlipidemia -     Lipid panel -     CMP14+EGFR -     CBC with Differential/Platelet  Note: This chart has been completed using Engineer, Civil (consulting) software, and while attempts have been made to ensure accuracy, certain words and phrases may not be transcribed as intended.    Follow-up: Return in about 5 months (around 12/01/2024).   Lyne Khurana  Z Bacchus, FNP

## 2024-07-20 ENCOUNTER — Ambulatory Visit: Admitting: Family Medicine

## 2024-07-25 ENCOUNTER — Ambulatory Visit
Admission: EM | Admit: 2024-07-25 | Discharge: 2024-07-25 | Disposition: A | Discharge status: Home / Self Care | Attending: Family Medicine | Admitting: Family Medicine

## 2024-07-25 DIAGNOSIS — S39012A Strain of muscle, fascia and tendon of lower back, initial encounter: Secondary | ICD-10-CM | POA: Diagnosis not present

## 2024-07-25 MED ORDER — DEXAMETHASONE SOD PHOSPHATE PF 10 MG/ML IJ SOLN
10.0000 mg | Freq: Once | INTRAMUSCULAR | Status: AC
  Administered 2024-07-25: 10 mg via INTRAMUSCULAR

## 2024-07-25 MED ORDER — TIZANIDINE HCL 4 MG PO TABS: 4.0000 mg | ORAL_TABLET | Freq: Three times a day (TID) | ORAL | 0 refills | Status: AC | PRN

## 2024-07-25 NOTE — ED Triage Notes (Signed)
 Pt reports lower back pain, x 2 days, denies urinary sx's. Has had difficulty getting in and out of bed because of the pain.

## 2024-07-25 NOTE — ED Provider Notes (Signed)
 " [facility]    CSN: [id number] Arrival date & time: 07/25/24  1226      History   Chief Complaint No chief complaint on file.   HPI Julie Case is a 33 y.o. female.   Pt reports lower back pain, x 2 days, denies urinary sx's. Has had difficulty getting in and out of bed because of the pain.       Past Medical History:  Diagnosis Date   Depression    Epilepsy (HCC)    Pregnancy induced hypertension     Patient Active Problem List   Diagnosis Date Noted   Atopic dermatitis 07/03/2024   Encounter for general adult medical examination with abnormal findings 12/19/2023   Left lumbar pain 11/27/2023   H/O tubal ligation 10/07/2023   History of gestational hypertension 10/06/2023   History of seizure disorder 04/17/2023   Depression 01/24/2022    Past Surgical History:  Procedure Laterality Date   CHOLECYSTECTOMY     ECTOPIC PREGNANCY SURGERY     with right tube removal   TUBAL LIGATION N/A 10/07/2023   Procedure: LIGATION, FALLOPIAN TUBE, POSTPARTUM;  Surgeon: Barbra Lang PARAS, DO;  Location: [facility];  Service: Gynecology;  Laterality: N/A;    OB History     Gravida  9   Para  6   Term  6   Preterm  0   AB  3   Living  6      SAB  2   IAB  0   Ectopic  1   Multiple  0   Live Births  6            Home Medications    Prior to Admission medications  Medication Sig Start Date End Date Taking? Authorizing Provider  tiZANidine  (ZANAFLEX ) 4 MG tablet Take 1 tablet (4 mg total) by mouth every 8 (eight) hours as needed for muscle spasms. Not drink alcohol or drive while taking this medication.  May cause drowsiness. 07/25/24  Yes Stuart Vernell Norris, PA-C  NIFEdipine  (PROCARDIA  XL/NIFEDICAL XL) 60 MG 24 hr tablet Take 1 tablet (60 mg total) by mouth daily. Patient not taking: Reported on 07/03/2024 12/02/23   Kizzie Suzen SAUNDERS, CNM  PARoxetine  (PAXIL ) 30 MG tablet Take 1 tablet (30 mg total) by mouth daily. 12/19/23   Bacchus,  Meade PEDLAR, FNP  tiZANidine  (ZANAFLEX ) 4 MG capsule Take 1 capsule (4 mg total) by mouth 3 (three) times daily. Do not drink alcohol or drive while taking this medication.  May cause drowsiness. Patient not taking: Reported on 07/03/2024 04/27/24   Stuart Vernell Norris, PA-C  triamcinolone  ointment (KENALOG ) 0.1 % Apply 1 Application topically 2 (two) times daily. 07/03/24   Bacchus, Meade PEDLAR, FNP    Family History Family History  Problem Relation Age of Onset   Diabetes Maternal Grandmother    Diabetes Maternal Grandfather    Hypertension Father    Hypercholesterolemia Father    Hypertension Mother    Hypercholesterolemia Mother    Schizophrenia Brother    Hypertension Other    Diabetes Other     Social History Social History[1]   Allergies   Penicillins, Naproxen, and Nsaids   Review of Systems Review of Systems PER HPI  Physical Exam Triage Vital Signs ED Triage Vitals  Encounter Vitals Group     BP 07/25/24 1336 127/80     Girls Systolic BP Percentile --      Girls Diastolic BP Percentile --  Boys Systolic BP Percentile --      Boys Diastolic BP Percentile --      Pulse Rate 07/25/24 1336 80     Resp 07/25/24 1336 18     Temp 07/25/24 1336 98.7 F (37.1 C)     Temp Source 07/25/24 1336 Oral     SpO2 07/25/24 1336 96 %     Weight --      Height --      Head Circumference --      Peak Flow --      Pain Score 07/25/24 1335 8     Pain Loc --      Pain Education --      Exclude from Growth Chart --    No data found.  Updated Vital Signs BP 127/80 (BP Location: Right Arm)   Pulse 80   Temp 98.7 F (37.1 C) (Oral)   Resp 18   LMP 07/15/2024 (Exact Date)   SpO2 96%   Breastfeeding No   Visual Acuity Right Eye Distance:   Left Eye Distance:   Bilateral Distance:    Right Eye Near:   Left Eye Near:    Bilateral Near:     Physical Exam Vitals and nursing note reviewed.  Constitutional:      Appearance: Normal appearance. She is not  ill-appearing.  HENT:     Head: Atraumatic.  Eyes:     Extraocular Movements: Extraocular movements intact.     Conjunctiva/sclera: Conjunctivae normal.  Cardiovascular:     Rate and Rhythm: Normal rate.  Pulmonary:     Effort: Pulmonary effort is normal.     Breath sounds: No wheezing or rales.  Musculoskeletal:        General: Tenderness present. No swelling or signs of injury. Normal range of motion.     Cervical back: Normal range of motion and neck supple.     Comments: No midline spinal tenderness to palpation diffusely.  Negative straight leg raise bilateral lower extremities.  Normal gait and range of motion.  Skin:    General: Skin is warm and dry.     Findings: No bruising or erythema.  Neurological:     Mental Status: She is alert and oriented to person, place, and time.     Comments: Bilateral lower extremities neurovascularly intact  Psychiatric:        Mood and Affect: Mood normal.        Thought Content: Thought content normal.        Judgment: Judgment normal.      UC Treatments / Results  Labs (all labs ordered are listed, but only abnormal results are displayed) Labs Reviewed - No data to display  EKG   Radiology No results found.  Procedures Procedures (including critical care time)  Medications Ordered in UC Medications  dexamethasone  (DECADRON ) injection 10 mg (has no administration in time range)    Initial Impression / Assessment and Plan / UC Course  I have reviewed the triage vital signs and the nursing notes.  Pertinent labs & imaging results that were available during my care of the patient were reviewed by me and considered in my medical decision making (see chart for details).     Vitals and exam reassuring today, no red flag findings.  Treat with IM Decadron  for lumbar strain, Zanaflex , heat, soft, stretches, rest.  Return for worsening or unresolving symptoms.  Final Clinical Impressions(s) / UC Diagnoses   Final diagnoses:   Strain of lumbar region,  initial encounter   Discharge Instructions   None    ED Prescriptions     Medication Sig Dispense Auth. Provider   tiZANidine  (ZANAFLEX ) 4 MG tablet Take 1 tablet (4 mg total) by mouth every 8 (eight) hours as needed for muscle spasms. Not drink alcohol or drive while taking this medication.  May cause drowsiness. 15 tablet Stuart Vernell Norris, [place]      PDMP not reviewed this encounter.    [1]  Social History Tobacco Use   Smoking status: Former    Current packs/day: 0.50    Types: Cigarettes   Smokeless tobacco: Never   Tobacco comments:    1/2 pack a day  Vaping Use   Vaping status: Some Days  Substance Use Topics   Alcohol use: Not Currently   Drug use: Not Currently     Stuart Vernell Norris, PA-C 07/25/24 1422  "

## 2024-08-14 ENCOUNTER — Ambulatory Visit: Admitting: Family Medicine

## 2024-08-19 ENCOUNTER — Ambulatory Visit
Admission: EM | Admit: 2024-08-19 | Discharge: 2024-08-19 | Disposition: A | Discharge status: Home / Self Care | Attending: Nurse Practitioner | Admitting: Nurse Practitioner

## 2024-08-19 DIAGNOSIS — R42 Dizziness and giddiness: Secondary | ICD-10-CM | POA: Diagnosis not present

## 2024-08-19 DIAGNOSIS — J069 Acute upper respiratory infection, unspecified: Secondary | ICD-10-CM | POA: Diagnosis not present

## 2024-08-19 LAB — POC COVID19/FLU A&B COMBO
Covid Antigen, POC: NEGATIVE
Influenza A Antigen, POC: NEGATIVE
Influenza B Antigen, POC: NEGATIVE

## 2024-08-19 MED ORDER — AZELASTINE HCL 0.1 % NA SOLN: 2.0000 | Freq: Two times a day (BID) | NASAL | 0 refills | Status: AC

## 2024-08-19 MED ORDER — PROMETHAZINE-DM 6.25-15 MG/5ML PO SYRP: 5.0000 mL | ORAL_SOLUTION | Freq: Four times a day (QID) | ORAL | 0 refills | Status: AC | PRN

## 2024-08-19 NOTE — Discharge Instructions (Addendum)
 The COVID/flu test was negative. Take medication as prescribed. Increase fluids and allow for plenty of rest. You may take over-the-counter Tylenol  as needed for pain, fever, or general discomfort. Warm salt water gargles 3-4 times daily as needed for throat pain or discomfort.  You may also use over-the-counter Chloraseptic throat spray or throat lozenges while symptoms persist. For the cough, recommend use of a humidifier in your bedroom at nighttime during sleep and to sleep elevated on pillows while symptoms persist. Symptoms should begin to improve over the next 5 to 7 days.  If symptoms fail to improve, or begin to worsen, you may follow-up in this clinic or with your primary care physician for further evaluation.  For your dizziness: Make sure you are drinking at least 8-10 8 ounce glasses of water daily. Avoid sudden movement while symptoms persist. Avoid caffeine  while symptoms persist. Go to the emergency department if you experience worsening dizziness, headache, visual changes, or other concerns. Follow-up with your primary care physician if symptoms fail to improve. Follow-up as needed. Follow-up as needed.

## 2024-08-19 NOTE — ED Triage Notes (Signed)
 Pt reports congestion cough, headache, loss of appetite dizziness x 2 days.

## 2024-08-19 NOTE — ED Provider Notes (Signed)
 " [facility]    CSN: [id number] Arrival date & time: 08/19/24  1334      History   Chief Complaint No chief complaint on file.   HPI Julie Case is a 34 y.o. female.   The history is provided by the patient.   Patient presents with a 2-day history of cough, congestion, headache, loss of appetite, and dizziness.  She denies fever, chills, ear pain, ear drainage, wheezing, difficulty breathing, abdominal pain, nausea, vomiting, diarrhea, or rash.  States that she has been drinking plenty of fluids.  She denies any obvious close sick contacts.  So far, she has not taken any medications for her symptoms.  Past Medical History:  Diagnosis Date   Depression    Epilepsy (HCC)    Pregnancy induced hypertension     Patient Active Problem List   Diagnosis Date Noted   Atopic dermatitis 07/03/2024   Encounter for general adult medical examination with abnormal findings 12/19/2023   Left lumbar pain 11/27/2023   H/O tubal ligation 10/07/2023   History of gestational hypertension 10/06/2023   History of seizure disorder 04/17/2023   Depression 01/24/2022    Past Surgical History:  Procedure Laterality Date   CHOLECYSTECTOMY     ECTOPIC PREGNANCY SURGERY     with right tube removal   TUBAL LIGATION N/A 10/07/2023   Procedure: LIGATION, FALLOPIAN TUBE, POSTPARTUM;  Surgeon: Barbra Lang PARAS, DO;  Location: [facility];  Service: Gynecology;  Laterality: N/A;    OB History     Gravida  9   Para  6   Term  6   Preterm  0   AB  3   Living  6      SAB  2   IAB  0   Ectopic  1   Multiple  0   Live Births  6            Home Medications    Prior to Admission medications  Medication Sig Start Date End Date Taking? Authorizing Provider  azelastine  (ASTELIN ) 0.1 % nasal spray Place 2 sprays into both nostrils 2 (two) times daily. Use in each nostril as directed 08/19/24  Yes Leath-Warren, Etta PARAS, NP  promethazine -dextromethorphan (PROMETHAZINE -DM)  6.25-15 MG/5ML syrup Take 5 mLs by mouth 4 (four) times daily as needed. 08/19/24  Yes Leath-Warren, Etta PARAS, NP  NIFEdipine  (PROCARDIA  XL/NIFEDICAL XL) 60 MG 24 hr tablet Take 1 tablet (60 mg total) by mouth daily. Patient not taking: Reported on 07/03/2024 12/02/23   Kizzie Suzen SAUNDERS, CNM  PARoxetine  (PAXIL ) 30 MG tablet Take 1 tablet (30 mg total) by mouth daily. 12/19/23   Bacchus, Meade PEDLAR, FNP  tiZANidine  (ZANAFLEX ) 4 MG capsule Take 1 capsule (4 mg total) by mouth 3 (three) times daily. Do not drink alcohol or drive while taking this medication.  May cause drowsiness. Patient not taking: Reported on 07/03/2024 04/27/24   Stuart Vernell Norris, PA-C  tiZANidine  (ZANAFLEX ) 4 MG tablet Take 1 tablet (4 mg total) by mouth every 8 (eight) hours as needed for muscle spasms. Not drink alcohol or drive while taking this medication.  May cause drowsiness. 07/25/24   Stuart Vernell Norris, PA-C  triamcinolone  ointment (KENALOG ) 0.1 % Apply 1 Application topically 2 (two) times daily. 07/03/24   Bacchus, Meade PEDLAR, FNP    Family History Family History  Problem Relation Age of Onset   Diabetes Maternal Grandmother    Diabetes Maternal Grandfather    Hypertension Father  Hypercholesterolemia Father    Hypertension Mother    Hypercholesterolemia Mother    Schizophrenia Brother    Hypertension Other    Diabetes Other     Social History Social History[1]   Allergies   Penicillins, Naproxen, and Nsaids   Review of Systems Review of Systems Per HPI  Physical Exam Triage Vital Signs ED Triage Vitals  Encounter Vitals Group     BP 08/19/24 1443 127/83     Girls Systolic BP Percentile --      Girls Diastolic BP Percentile --      Boys Systolic BP Percentile --      Boys Diastolic BP Percentile --      Pulse Rate 08/19/24 1443 80     Resp 08/19/24 1443 20     Temp 08/19/24 1443 (!) 97.5 F (36.4 C)     Temp Source 08/19/24 1443 Oral     SpO2 08/19/24 1443 98 %     Weight --       Height --      Head Circumference --      Peak Flow --      Pain Score 08/19/24 1446 0     Pain Loc --      Pain Education --      Exclude from Growth Chart --    Orthostatic VS for the past 24 hrs:  BP- Lying Pulse- Lying BP- Sitting Pulse- Sitting BP- Standing at 0 minutes Pulse- Standing at 0 minutes  08/19/24 1536 115/84 74 109/76 96 112/60 96    Updated Vital Signs BP 127/83 (BP Location: Right Arm)   Pulse 80   Temp (!) 97.5 F (36.4 C) (Oral)   Resp 20   LMP 08/10/2024 (Exact Date)   SpO2 98%   Breastfeeding No   Visual Acuity Right Eye Distance:   Left Eye Distance:   Bilateral Distance:    Right Eye Near:   Left Eye Near:    Bilateral Near:     Physical Exam Vitals and nursing note reviewed.  Constitutional:      General: She is not in acute distress.    Appearance: Normal appearance. She is well-developed.  HENT:     Head: Normocephalic and atraumatic.     Right Ear: Tympanic membrane, ear canal and external ear normal.     Left Ear: Tympanic membrane, ear canal and external ear normal.     Nose: Congestion present.     Right Turbinates: Enlarged and swollen.     Left Turbinates: Enlarged and swollen.     Right Sinus: No maxillary sinus tenderness or frontal sinus tenderness.     Left Sinus: No maxillary sinus tenderness or frontal sinus tenderness.     Mouth/Throat:     Lips: Pink.     Mouth: Mucous membranes are moist.     Pharynx: Uvula midline. Postnasal drip present. No pharyngeal swelling, oropharyngeal exudate, posterior oropharyngeal erythema or uvula swelling.     Comments: Cobblestoning present to posterior oropharynx  Eyes:     Extraocular Movements: Extraocular movements intact.     Conjunctiva/sclera: Conjunctivae normal.     Pupils: Pupils are equal, round, and reactive to light.  Neck:     Thyroid : No thyromegaly.     Trachea: No tracheal deviation.  Cardiovascular:     Rate and Rhythm: Normal rate and regular rhythm.     Pulses:  Normal pulses.     Heart sounds: Normal heart sounds.  Pulmonary:  Effort: Pulmonary effort is normal. No respiratory distress.     Breath sounds: Normal breath sounds. No stridor. No wheezing, rhonchi or rales.  Abdominal:     General: Bowel sounds are normal.     Palpations: Abdomen is soft.     Tenderness: There is no abdominal tenderness.  Musculoskeletal:     Cervical back: Normal range of motion and neck supple.  Skin:    General: Skin is warm and dry.  Neurological:     General: No focal deficit present.     Mental Status: She is alert and oriented to person, place, and time.  Psychiatric:        Mood and Affect: Mood normal.        Behavior: Behavior normal.        Thought Content: Thought content normal.        Judgment: Judgment normal.      UC Treatments / Results  Labs (all labs ordered are listed, but only abnormal results are displayed) Labs Reviewed  POC COVID19/FLU A&B COMBO    EKG   Radiology No results found.  Procedures Procedures (including critical care time)  Medications Ordered in UC Medications - No data to display  Initial Impression / Assessment and Plan / UC Course  I have reviewed the triage vital signs and the nursing notes.  Pertinent labs & imaging results that were available during my care of the patient were reviewed by me and considered in my medical decision making (see chart for details).  COVID/flu test is negative.  On exam, the patient's lung sounds are clear throughout, room air sats are at 98%.  She is well-appearing, is in no acute distress, vital signs are stable.  Orthostatic vital signs were negative.  Symptoms consistent with viral etiology at this time.  Will provide symptomatic treatment with promethazine  DM for the cough and azelastine  nasal spray for nasal congestion or runny nose.  Patient advised to increase her fluid intake to help with dizziness.  Patient was also given indications regarding follow-up for  ongoing dizziness.  Supportive care recommendations were provided and discussed with the patient to include fluids, rest, over-the-counter analgesics, use of normal saline, use of a humidifier during sleep.  Discussed indications with the patient regarding follow-up.  Patient was in agreement with this plan of care and verbalizes understanding.  All questions were answered.  Patient stable for discharge.  Work note was provided for her partner.   Final Clinical Impressions(s) / UC Diagnoses   Final diagnoses:  Viral URI with cough  Dizziness     Discharge Instructions      The COVID/flu test was negative. Take medication as prescribed. Increase fluids and allow for plenty of rest. You may take over-the-counter Tylenol  as needed for pain, fever, or general discomfort. Warm salt water gargles 3-4 times daily as needed for throat pain or discomfort.  You may also use over-the-counter Chloraseptic throat spray or throat lozenges while symptoms persist. For the cough, recommend use of a humidifier in your bedroom at nighttime during sleep and to sleep elevated on pillows while symptoms persist. Symptoms should begin to improve over the next 5 to 7 days.  If symptoms fail to improve, or begin to worsen, you may follow-up in this clinic or with your primary care physician for further evaluation.  For your dizziness: Make sure you are drinking at least 8-10 8 ounce glasses of water daily. Avoid sudden movement while symptoms persist. Avoid caffeine  while symptoms persist.  Go to the emergency department if you experience worsening dizziness, headache, visual changes, or other concerns. Follow-up with your primary care physician if symptoms fail to improve. Follow-up as needed. Follow-up as needed.     ED Prescriptions     Medication Sig Dispense Auth. Provider   promethazine -dextromethorphan (PROMETHAZINE -DM) 6.25-15 MG/5ML syrup Take 5 mLs by mouth 4 (four) times daily as needed. 118 mL  Leath-Warren, Etta PARAS, NP   azelastine  (ASTELIN ) 0.1 % nasal spray Place 2 sprays into both nostrils 2 (two) times daily. Use in each nostril as directed 30 mL Leath-Warren, Etta PARAS, NP      PDMP not reviewed this encounter.    [1]  Social History Tobacco Use   Smoking status: Former    Current packs/day: 0.50    Types: Cigarettes   Smokeless tobacco: Never   Tobacco comments:    1/2 pack a day  Vaping Use   Vaping status: Some Days  Substance Use Topics   Alcohol use: Not Currently   Drug use: Not Currently     Gilmer Etta PARAS, NP 08/19/24 1544  "

## 2024-08-27 ENCOUNTER — Encounter: Payer: Self-pay | Admitting: Emergency Medicine

## 2024-08-27 ENCOUNTER — Ambulatory Visit
Admission: EM | Admit: 2024-08-27 | Discharge: 2024-08-27 | Disposition: A | Discharge status: Home / Self Care | Attending: Family Medicine | Admitting: Family Medicine

## 2024-08-27 DIAGNOSIS — U071 COVID-19: Secondary | ICD-10-CM | POA: Diagnosis not present

## 2024-08-27 LAB — POC COVID19/FLU A&B COMBO
Covid Antigen, POC: POSITIVE — AB
Influenza A Antigen, POC: NEGATIVE
Influenza B Antigen, POC: NEGATIVE

## 2024-08-27 NOTE — ED Provider Notes (Signed)
 " [facility]    CSN: [id number] Arrival date & time: 08/27/24  1554      History   Chief Complaint No chief complaint on file.   HPI Julie Case is a 34 y.o. female.   Patient presenting today following up on last week's visit for viral respiratory infection symptoms.  States her symptoms have become worse the past day or so now with fevers, body aches, chills, worsening cough and congestion.  Denies chest pain, shortness of breath, vomiting, diarrhea, rashes.  So far taking her cough syrup and nasal spray that were prescribed last week with some relief.  Daughter tested positive for COVID this morning.    Past Medical History:  Diagnosis Date   Depression    Epilepsy (HCC)    Pregnancy induced hypertension     Patient Active Problem List   Diagnosis Date Noted   Atopic dermatitis 07/03/2024   Encounter for general adult medical examination with abnormal findings 12/19/2023   Left lumbar pain 11/27/2023   H/O tubal ligation 10/07/2023   History of gestational hypertension 10/06/2023   History of seizure disorder 04/17/2023   Depression 01/24/2022    Past Surgical History:  Procedure Laterality Date   CHOLECYSTECTOMY     ECTOPIC PREGNANCY SURGERY     with right tube removal   TUBAL LIGATION N/A 10/07/2023   Procedure: LIGATION, FALLOPIAN TUBE, POSTPARTUM;  Surgeon: Barbra Lang PARAS, DO;  Location: [facility];  Service: Gynecology;  Laterality: N/A;    OB History     Gravida  9   Para  6   Term  6   Preterm  0   AB  3   Living  6      SAB  2   IAB  0   Ectopic  1   Multiple  0   Live Births  6            Home Medications    Prior to Admission medications  Medication Sig Start Date End Date Taking? Authorizing Provider  azelastine  (ASTELIN ) 0.1 % nasal spray Place 2 sprays into both nostrils 2 (two) times daily. Use in each nostril as directed 08/19/24   Leath-Warren, Etta PARAS, NP  NIFEdipine  (PROCARDIA  XL/NIFEDICAL XL) 60 MG  24 hr tablet Take 1 tablet (60 mg total) by mouth daily. Patient not taking: Reported on 07/03/2024 12/02/23   Kizzie Suzen SAUNDERS, CNM  PARoxetine  (PAXIL ) 30 MG tablet Take 1 tablet (30 mg total) by mouth daily. 12/19/23   Bacchus, Meade PEDLAR, FNP  promethazine -dextromethorphan (PROMETHAZINE -DM) 6.25-15 MG/5ML syrup Take 5 mLs by mouth 4 (four) times daily as needed. 08/19/24   Leath-Warren, Etta PARAS, NP  tiZANidine  (ZANAFLEX ) 4 MG tablet Take 1 tablet (4 mg total) by mouth every 8 (eight) hours as needed for muscle spasms. Not drink alcohol or drive while taking this medication.  May cause drowsiness. 07/25/24   Stuart Vernell Norris, PA-C  triamcinolone  ointment (KENALOG ) 0.1 % Apply 1 Application topically 2 (two) times daily. 07/03/24   Bacchus, Meade PEDLAR, FNP    Family History Family History  Problem Relation Age of Onset   Diabetes Maternal Grandmother    Diabetes Maternal Grandfather    Hypertension Father    Hypercholesterolemia Father    Hypertension Mother    Hypercholesterolemia Mother    Schizophrenia Brother    Hypertension Other    Diabetes Other     Social History Social History[1]   Allergies   Penicillins, Naproxen, and  Nsaids   Review of Systems Review of Systems Per HPI  Physical Exam Triage Vital Signs ED Triage Vitals  Encounter Vitals Group     BP 08/27/24 1629 122/83     Girls Systolic BP Percentile --      Girls Diastolic BP Percentile --      Boys Systolic BP Percentile --      Boys Diastolic BP Percentile --      Pulse Rate 08/27/24 1629 84     Resp 08/27/24 1629 18     Temp 08/27/24 1629 98.1 F (36.7 C)     Temp Source 08/27/24 1629 Oral     SpO2 08/27/24 1629 97 %     Weight --      Height --      Head Circumference --      Peak Flow --      Pain Score 08/27/24 1632 5     Pain Loc --      Pain Education --      Exclude from Growth Chart --    No data found.  Updated Vital Signs BP 122/83 (BP Location: Right Arm)   Pulse 84   Temp  98.1 F (36.7 C) (Oral)   Resp 18   LMP 08/10/2024 (Exact Date)   SpO2 97%   Visual Acuity Right Eye Distance:   Left Eye Distance:   Bilateral Distance:    Right Eye Near:   Left Eye Near:    Bilateral Near:     Physical Exam Vitals and nursing note reviewed.  Constitutional:      Appearance: Normal appearance.  HENT:     Head: Atraumatic.     Right Ear: Tympanic membrane and external ear normal.     Left Ear: Tympanic membrane and external ear normal.     Nose: Rhinorrhea present.     Mouth/Throat:     Mouth: Mucous membranes are moist.     Pharynx: Posterior oropharyngeal erythema present.  Eyes:     Extraocular Movements: Extraocular movements intact.     Conjunctiva/sclera: Conjunctivae normal.  Cardiovascular:     Rate and Rhythm: Normal rate and regular rhythm.     Heart sounds: Normal heart sounds.  Pulmonary:     Effort: Pulmonary effort is normal.     Breath sounds: Normal breath sounds. No wheezing or rales.  Musculoskeletal:        General: Normal range of motion.     Cervical back: Normal range of motion and neck supple.  Skin:    General: Skin is warm and dry.  Neurological:     Mental Status: She is alert and oriented to person, place, and time.  Psychiatric:        Mood and Affect: Mood normal.        Thought Content: Thought content normal.      UC Treatments / Results  Labs (all labs ordered are listed, but only abnormal results are displayed) Labs Reviewed  POC COVID19/FLU A&B COMBO - Abnormal; Notable for the following components:      Result Value   Covid Antigen, POC Positive (*)    All other components within normal limits    EKG   Radiology No results found.  Procedures Procedures (including critical care time)  Medications Ordered in UC Medications - No data to display  Initial Impression / Assessment and Plan / UC Course  I have reviewed the triage vital signs and the nursing notes.  Pertinent labs &  imaging results  that were available during my care of the patient were reviewed by me and considered in my medical decision making (see chart for details).     Vitals and exam reassuring today, rapid COVID-positive.  Continue cough syrup, nasal spray and discussed supportive over-the-counter medications at home care.  Return for worsening or unresolving symptoms.  Final Clinical Impressions(s) / UC Diagnoses   Final diagnoses:  COVID-19     Discharge Instructions      Continue cough syrup and nasal spray prescribed last week and you may take over-the-counter remedies such as over-the-counter pain and fever reducers, DayQuil, NyQuil if needed.  Return for worsening symptoms    ED Prescriptions   None    PDMP not reviewed this encounter.    [1]  Social History Tobacco Use   Smoking status: Former    Current packs/day: 0.50    Types: Cigarettes   Smokeless tobacco: Never   Tobacco comments:    1/2 pack a day  Vaping Use   Vaping status: Some Days  Substance Use Topics   Alcohol use: Not Currently   Drug use: Not Currently     Stuart Vernell Norris, PA-C 08/27/24 1710  "

## 2024-08-27 NOTE — ED Triage Notes (Signed)
 States was seen last week for cough and congestion.  States symptoms have become worse.  C/o cough, congestion, body aches, fever and chills.  States daughter tested positive for covid this morning,

## 2024-08-27 NOTE — Discharge Instructions (Signed)
 Continue cough syrup and nasal spray prescribed last week and you may take over-the-counter remedies such as over-the-counter pain and fever reducers, DayQuil, NyQuil if needed.  Return for worsening symptoms
